# Patient Record
Sex: Female | Born: 1980
Health system: Southern US, Community
[De-identification: ages and names within clinical notes are randomized; demographics above are authoritative.]

## PROBLEM LIST (undated history)

## (undated) DIAGNOSIS — F410 Panic disorder [episodic paroxysmal anxiety] without agoraphobia: Secondary | ICD-10-CM

## (undated) DIAGNOSIS — F419 Anxiety disorder, unspecified: Secondary | ICD-10-CM

## (undated) DIAGNOSIS — N6019 Diffuse cystic mastopathy of unspecified breast: Secondary | ICD-10-CM

## (undated) DIAGNOSIS — I7300 Raynaud's syndrome without gangrene: Secondary | ICD-10-CM

## (undated) DIAGNOSIS — F32A Depression, unspecified: Secondary | ICD-10-CM

## (undated) DIAGNOSIS — J329 Chronic sinusitis, unspecified: Secondary | ICD-10-CM

## (undated) DIAGNOSIS — M069 Rheumatoid arthritis, unspecified: Secondary | ICD-10-CM

## (undated) DIAGNOSIS — G479 Sleep disorder, unspecified: Secondary | ICD-10-CM

## (undated) DIAGNOSIS — R569 Unspecified convulsions: Secondary | ICD-10-CM

## (undated) DIAGNOSIS — I10 Essential (primary) hypertension: Secondary | ICD-10-CM

## (undated) DIAGNOSIS — J3501 Chronic tonsillitis: Secondary | ICD-10-CM

## (undated) DIAGNOSIS — F259 Schizoaffective disorder, unspecified: Secondary | ICD-10-CM

## (undated) DIAGNOSIS — D649 Anemia, unspecified: Secondary | ICD-10-CM

## (undated) DIAGNOSIS — F329 Major depressive disorder, single episode, unspecified: Secondary | ICD-10-CM

## (undated) DIAGNOSIS — E785 Hyperlipidemia, unspecified: Secondary | ICD-10-CM

## (undated) DIAGNOSIS — Z8742 Personal history of other diseases of the female genital tract: Secondary | ICD-10-CM

## (undated) DIAGNOSIS — M329 Systemic lupus erythematosus, unspecified: Secondary | ICD-10-CM

## (undated) DIAGNOSIS — R51 Headache: Secondary | ICD-10-CM

## (undated) DIAGNOSIS — M792 Neuralgia and neuritis, unspecified: Secondary | ICD-10-CM

## (undated) DIAGNOSIS — E041 Nontoxic single thyroid nodule: Secondary | ICD-10-CM

## (undated) DIAGNOSIS — IMO0002 Reserved for concepts with insufficient information to code with codable children: Secondary | ICD-10-CM

## (undated) HISTORY — PX: CHOLECYSTECTOMY: SHX55

## (undated) HISTORY — PX: ABDOMINAL HYSTERECTOMY: SHX81

---

## 2011-05-24 HISTORY — PX: FOOT SURGERY: SHX648

## 2012-03-04 ENCOUNTER — Ambulatory Visit
Admission: RE | Admit: 2012-03-04 | Discharge: 2012-03-04 | Disposition: A | Discharge status: Home / Self Care | Payer: Medicaid Other | Source: Ambulatory Visit | Attending: Family Medicine | Admitting: Family Medicine

## 2012-03-04 ENCOUNTER — Other Ambulatory Visit: Payer: Self-pay | Admitting: Family Medicine

## 2012-03-04 DIAGNOSIS — I10 Essential (primary) hypertension: Secondary | ICD-10-CM

## 2012-03-23 DIAGNOSIS — J3501 Chronic tonsillitis: Secondary | ICD-10-CM

## 2012-03-23 HISTORY — DX: Chronic tonsillitis: J35.01

## 2012-04-15 ENCOUNTER — Encounter (HOSPITAL_BASED_OUTPATIENT_CLINIC_OR_DEPARTMENT_OTHER): Payer: Self-pay | Admitting: *Deleted

## 2012-04-15 NOTE — Pre-Procedure Instructions (Signed)
To come for BMET and EKG 

## 2012-04-21 ENCOUNTER — Encounter (HOSPITAL_BASED_OUTPATIENT_CLINIC_OR_DEPARTMENT_OTHER)
Admission: RE | Admit: 2012-04-21 | Discharge: 2012-04-21 | Disposition: A | Discharge status: Home / Self Care | Payer: Medicare Other | Source: Ambulatory Visit | Attending: Otolaryngology | Admitting: Otolaryngology

## 2012-04-21 LAB — BASIC METABOLIC PANEL
BUN: 8 mg/dL (ref 6–23)
CO2: 30 mEq/L (ref 19–32)
Calcium: 10 mg/dL (ref 8.4–10.5)
Chloride: 102 mEq/L (ref 96–112)
Creatinine, Ser: 0.76 mg/dL (ref 0.50–1.10)
GFR calc Af Amer: >90 mL/min (ref >90)
GFR calc non Af Amer: >90 mL/min (ref >90)
Glucose, Bld: 103 mg/dL — ABNORMAL HIGH (ref 70–99)
Potassium: 4.6 mEq/L (ref 3.5–5.1)
Sodium: 140 mEq/L (ref 135–145)

## 2012-04-22 ENCOUNTER — Ambulatory Visit (HOSPITAL_BASED_OUTPATIENT_CLINIC_OR_DEPARTMENT_OTHER)
Admission: RE | Admit: 2012-04-22 | Discharge: 2012-04-22 | Disposition: A | Discharge status: Home / Self Care | Payer: Medicare Other | Source: Ambulatory Visit | Attending: Otolaryngology | Admitting: Otolaryngology

## 2012-04-22 ENCOUNTER — Encounter (HOSPITAL_BASED_OUTPATIENT_CLINIC_OR_DEPARTMENT_OTHER): Payer: Self-pay | Admitting: Anesthesiology

## 2012-04-22 ENCOUNTER — Encounter (HOSPITAL_BASED_OUTPATIENT_CLINIC_OR_DEPARTMENT_OTHER)
Admission: RE | Disposition: A | Discharge status: Home / Self Care | Payer: Self-pay | Source: Ambulatory Visit | Attending: Otolaryngology

## 2012-04-22 ENCOUNTER — Ambulatory Visit (HOSPITAL_BASED_OUTPATIENT_CLINIC_OR_DEPARTMENT_OTHER): Payer: Medicare Other | Admitting: Anesthesiology

## 2012-04-22 ENCOUNTER — Encounter (HOSPITAL_BASED_OUTPATIENT_CLINIC_OR_DEPARTMENT_OTHER): Payer: Self-pay | Admitting: *Deleted

## 2012-04-22 DIAGNOSIS — Z01812 Encounter for preprocedural laboratory examination: Secondary | ICD-10-CM | POA: Insufficient documentation

## 2012-04-22 DIAGNOSIS — J3501 Chronic tonsillitis: Secondary | ICD-10-CM | POA: Insufficient documentation

## 2012-04-22 DIAGNOSIS — E119 Type 2 diabetes mellitus without complications: Secondary | ICD-10-CM | POA: Insufficient documentation

## 2012-04-22 DIAGNOSIS — I10 Essential (primary) hypertension: Secondary | ICD-10-CM | POA: Insufficient documentation

## 2012-04-22 DIAGNOSIS — Z01810 Encounter for preprocedural cardiovascular examination: Secondary | ICD-10-CM | POA: Insufficient documentation

## 2012-04-22 HISTORY — DX: Hyperlipidemia, unspecified: E78.5

## 2012-04-22 HISTORY — DX: Sleep disorder, unspecified: G47.9

## 2012-04-22 HISTORY — DX: Neuralgia and neuritis, unspecified: M79.2

## 2012-04-22 HISTORY — DX: Rheumatoid arthritis, unspecified: M06.9

## 2012-04-22 HISTORY — PX: TONSILLECTOMY: SHX5217

## 2012-04-22 HISTORY — DX: Essential (primary) hypertension: I10

## 2012-04-22 HISTORY — DX: Unspecified convulsions: R56.9

## 2012-04-22 HISTORY — DX: Anxiety disorder, unspecified: F41.9

## 2012-04-22 HISTORY — DX: Reserved for concepts with insufficient information to code with codable children: IMO0002

## 2012-04-22 HISTORY — DX: Nontoxic single thyroid nodule: E04.1

## 2012-04-22 HISTORY — DX: Panic disorder (episodic paroxysmal anxiety): F41.0

## 2012-04-22 HISTORY — DX: Chronic tonsillitis: J35.01

## 2012-04-22 HISTORY — DX: Schizoaffective disorder, unspecified: F25.9

## 2012-04-22 HISTORY — DX: Chronic sinusitis, unspecified: J32.9

## 2012-04-22 HISTORY — DX: Depression, unspecified: F32.A

## 2012-04-22 HISTORY — DX: Personal history of other diseases of the female genital tract: Z87.42

## 2012-04-22 HISTORY — DX: Headache: R51

## 2012-04-22 HISTORY — DX: Anemia, unspecified: D64.9

## 2012-04-22 HISTORY — DX: Diffuse cystic mastopathy of unspecified breast: N60.19

## 2012-04-22 HISTORY — DX: Major depressive disorder, single episode, unspecified: F32.9

## 2012-04-22 HISTORY — DX: Systemic lupus erythematosus, unspecified: M32.9

## 2012-04-22 HISTORY — DX: Raynaud's syndrome without gangrene: I73.00

## 2012-04-22 LAB — POCT HEMOGLOBIN-HEMACUE: Hemoglobin: 12 g/dL (ref 12.0–15.0)

## 2012-04-22 SURGERY — TONSILLECTOMY
Anesthesia: General | Site: Mouth | Wound class: Clean Contaminated

## 2012-04-22 MED ORDER — OXYCODONE HCL 5 MG PO TABS: 5.0000 mg | ORAL_TABLET | Freq: Once | ORAL | Status: DC | PRN

## 2012-04-22 MED ORDER — HYDROMORPHONE HCL PF 1 MG/ML IJ SOLN
0.2500 mg | INTRAMUSCULAR | Status: DC | PRN
  Administered 2012-04-22: 0.5 mg via INTRAVENOUS

## 2012-04-22 MED ORDER — LIDOCAINE HCL (CARDIAC) 20 MG/ML IV SOLN
INTRAVENOUS | Status: DC | PRN
  Administered 2012-04-22: 75 mg via INTRAVENOUS

## 2012-04-22 MED ORDER — LACTATED RINGERS IV SOLN
INTRAVENOUS | Status: DC
  Administered 2012-04-22: 08:00:00 via INTRAVENOUS

## 2012-04-22 MED ORDER — FENTANYL CITRATE 0.05 MG/ML IJ SOLN
INTRAMUSCULAR | Status: DC | PRN
  Administered 2012-04-22: 100 ug via INTRAVENOUS
  Administered 2012-04-22: 50 ug via INTRAVENOUS

## 2012-04-22 MED ORDER — OXYCODONE HCL 5 MG/5ML PO SOLN: 7.5000 mg | ORAL | Status: DC | PRN

## 2012-04-22 MED ORDER — ONDANSETRON HCL 4 MG/2ML IJ SOLN
INTRAMUSCULAR | Status: DC | PRN
  Administered 2012-04-22: 4 mg via INTRAVENOUS

## 2012-04-22 MED ORDER — CLINDAMYCIN PHOSPHATE 600 MG/50ML IV SOLN
INTRAVENOUS | Status: DC | PRN
  Administered 2012-04-22: 600 mg via INTRAVENOUS

## 2012-04-22 MED ORDER — OXYCODONE HCL 5 MG/5ML PO SOLN: 5.0000 mg | Freq: Once | ORAL | Status: DC | PRN

## 2012-04-22 MED ORDER — MEPERIDINE HCL 25 MG/ML IJ SOLN: 6.2500 mg | INTRAMUSCULAR | Status: DC | PRN

## 2012-04-22 MED ORDER — HYDROMORPHONE HCL PF 1 MG/ML IJ SOLN: 0.2500 mg | INTRAMUSCULAR | Status: DC | PRN

## 2012-04-22 MED ORDER — ONDANSETRON HCL 4 MG/2ML IJ SOLN: 4.0000 mg | Freq: Once | INTRAMUSCULAR | Status: DC | PRN

## 2012-04-22 MED ORDER — SUCCINYLCHOLINE CHLORIDE 20 MG/ML IJ SOLN
INTRAMUSCULAR | Status: DC | PRN
  Administered 2012-04-22: 100 mg via INTRAVENOUS

## 2012-04-22 MED ORDER — PROPOFOL 10 MG/ML IV BOLUS
INTRAVENOUS | Status: DC | PRN
  Administered 2012-04-22: 50 mg via INTRAVENOUS

## 2012-04-22 MED ORDER — DEXAMETHASONE SODIUM PHOSPHATE 4 MG/ML IJ SOLN
INTRAMUSCULAR | Status: DC | PRN
  Administered 2012-04-22: 10 mg via INTRAVENOUS

## 2012-04-22 MED ORDER — MIDAZOLAM HCL 5 MG/5ML IJ SOLN
INTRAMUSCULAR | Status: DC | PRN
  Administered 2012-04-22: 2 mg via INTRAVENOUS

## 2012-04-22 MED ORDER — CLINDAMYCIN PALMITATE HCL 75 MG/5ML PO SOLR: 150.0000 mg | Freq: Three times a day (TID) | ORAL | Status: AC

## 2012-04-22 SURGICAL SUPPLY — 32 items
BANDAGE COBAN STERILE 2 (GAUZE/BANDAGES/DRESSINGS) IMPLANT
CANISTER SUCTION 1200CC (MISCELLANEOUS) ×2 IMPLANT
CATH ROBINSON RED A/P 12FR (CATHETERS) IMPLANT
CATH ROBINSON RED A/P 14FR (CATHETERS) IMPLANT
CLOTH BEACON ORANGE TIMEOUT ST (SAFETY) ×2 IMPLANT
COAGULATOR SUCT SWTCH 10FR 6 (ELECTROSURGICAL) IMPLANT
COVER MAYO STAND STRL (DRAPES) ×2 IMPLANT
ELECT COATED BLADE 2.86 ST (ELECTRODE) ×2 IMPLANT
ELECT REM PT RETURN 9FT ADLT (ELECTROSURGICAL) ×2
ELECT REM PT RETURN 9FT PED (ELECTROSURGICAL)
ELECTRODE REM PT RETRN 9FT PED (ELECTROSURGICAL) IMPLANT
ELECTRODE REM PT RTRN 9FT ADLT (ELECTROSURGICAL) ×1 IMPLANT
GAUZE SPONGE 4X4 12PLY STRL LF (GAUZE/BANDAGES/DRESSINGS) ×2 IMPLANT
GLOVE SKINSENSE NS SZ6.5 (GLOVE) ×1
GLOVE SKINSENSE NS SZ7.5 (GLOVE) ×1
GLOVE SKINSENSE STRL SZ6.5 (GLOVE) ×1 IMPLANT
GLOVE SKINSENSE STRL SZ7.5 (GLOVE) ×1 IMPLANT
GLOVE SS BIOGEL STRL SZ 7.5 (GLOVE) IMPLANT
GLOVE SUPERSENSE BIOGEL SZ 7.5 (GLOVE)
GOWN PREVENTION PLUS XLARGE (GOWN DISPOSABLE) ×2 IMPLANT
GOWN PREVENTION PLUS XXLARGE (GOWN DISPOSABLE) IMPLANT
MARKER SKIN DUAL TIP RULER LAB (MISCELLANEOUS) IMPLANT
NS IRRIG 1000ML POUR BTL (IV SOLUTION) ×2 IMPLANT
PENCIL FOOT CONTROL (ELECTRODE) ×2 IMPLANT
SHEET MEDIUM DRAPE 40X70 STRL (DRAPES) ×2 IMPLANT
SOLUTION BUTLER CLEAR DIP (MISCELLANEOUS) IMPLANT
SPONGE TONSIL 1 RF SGL (DISPOSABLE) IMPLANT
SPONGE TONSIL 1.25 RF SGL STRG (GAUZE/BANDAGES/DRESSINGS) IMPLANT
SYR BULB 3OZ (MISCELLANEOUS) ×2 IMPLANT
TOWEL OR 17X24 6PK STRL BLUE (TOWEL DISPOSABLE) ×2 IMPLANT
TUBE CONNECTING 20X1/4 (TUBING) ×2 IMPLANT
WATER STERILE IRR 1000ML POUR (IV SOLUTION) IMPLANT

## 2012-04-22 NOTE — Brief Op Note (Signed)
04/22/2012  8:34 AM  PATIENT:  Victoria Holmes  31 y.o. female  PRE-OPERATIVE DIAGNOSIS:  chronic tonsillitis  POST-OPERATIVE DIAGNOSIS:  chronic tonsillitis  PROCEDURE:  Procedure(s) (LRB) with comments: TONSILLECTOMY (N/A)  SURGEON:  Surgeon(s) and Role:    * Drema Halon, MD - Primary  PHYSICIAN ASSISTANT:   ASSISTANTS: none   ANESTHESIA:   general  EBL:     BLOOD ADMINISTERED:none  DRAINS: none   LOCAL MEDICATIONS USED:  NONE  SPECIMEN:  Source of Specimen:  tonsils  DISPOSITION OF SPECIMEN:  PATHOLOGY  COUNTS:  YES  TOURNIQUET:  * No tourniquets in log *  DICTATION: .Other Dictation: Dictation Number [id number]  PLAN OF CARE: Discharge to home after PACU  PATIENT DISPOSITION:  PACU - hemodynamically stable.   Delay start of Pharmacological VTE agent (>24hrs) due to surgical blood loss or risk of bleeding: not applicable

## 2012-04-22 NOTE — Transfer of Care (Signed)
Immediate Anesthesia Transfer of Care Note  Patient: Victoria Holmes  Procedure(s) Performed: Procedure(s) (LRB) with comments: TONSILLECTOMY (N/A)  Patient Location: PACU  Anesthesia Type: General  Level of Consciousness: awake, alert  and oriented  Airway & Oxygen Therapy: Patient Spontanous Breathing and Patient connected to face mask oxygen  Post-op Assessment: Report given to PACU RN and Post -op Vital signs reviewed and stable  Post vital signs: Reviewed and stable  Complications: No apparent anesthesia complications

## 2012-04-22 NOTE — Anesthesia Procedure Notes (Signed)
Procedure Name: Intubation Date/Time: 04/22/2012 8:17 AM Performed by: Zenia Resides D Pre-anesthesia Checklist: Patient identified, Emergency Drugs available, Suction available, Patient being monitored and Timeout performed Patient Re-evaluated:Patient Re-evaluated prior to inductionOxygen Delivery Method: Circle System Utilized Preoxygenation: Pre-oxygenation with 100% oxygen Intubation Type: IV induction Ventilation: Mask ventilation without difficulty Laryngoscope Size: Mac and 3 Grade View: Grade I Tube type: Oral Tube size: 7.0 mm Number of attempts: 1 Airway Equipment and Method: stylet and oral airway Placement Confirmation: ETT inserted through vocal cords under direct vision,  positive ETCO2 and breath sounds checked- equal and bilateral Secured at: 23 cm Tube secured with: Tape Dental Injury: Teeth and Oropharynx as per pre-operative assessment

## 2012-04-22 NOTE — H&P (Signed)
PREOPERATIVE H&P  Chief Complaint: chronic tonsillitis  HPI: Victoria Holmes is a 31 y.o. female who presents for evaluation of chronic tonsil problems. She's had frequent infections and large cryptic tonsils on exam. She's taken to the OR for tonsillectomy.  Past Medical History  Diagnosis Date  . Seizures     last seizure > 1 yr. ago  . Headache     migraines  . Anemia     no current med.  . Rheumatoid arthritis   . Lupus   . History of endometriosis   . Hyperlipidemia   . Thyroid nodule   . Schizoaffective disorder   . Diabetes mellitus     diet-controlled  . Neuritis   . Sleep disturbance   . Depression   . Anxiety   . Panic attacks   . Raynauds disease   . Fibrocystic breast disease   . Sinusitis   . Chronic tonsillitis 03/2012  . Hypertension     under control, has been on med. x 1 yr.   Past Surgical History  Procedure Date  . Abdominal hysterectomy 6 yrs. ago    complete  . Cholecystectomy 6-8 yrs. ago  . Foot surgery 05/2011    right great toe   History   Social History  . Marital Status: Single    Spouse Name: N/A    Number of Children: N/A  . Years of Education: N/A   Social History Main Topics  . Smoking status: Current Every Day Smoker -- 3 years    Types: Cigarettes  . Smokeless tobacco: Never Used   Comment: 3 cig./day  . Alcohol Use: Yes     occasionally  . Drug Use: No  . Sexually Active:    Other Topics Concern  . None   Social History Narrative  . None   History reviewed. No pertinent family history. Allergies  Allergen Reactions  . Azithromycin Shortness Of Breath and Rash  . Darvocet (Propoxyphene-Acetaminophen) Shortness Of Breath and Rash  . Grapeseed Extract (Nutritional Supplements) Shortness Of Breath, Swelling and Rash    GRAPES  . Kiwi Extract Shortness Of Breath, Swelling and Rash  . Latex Shortness Of Breath  . Macrolides And Ketolides Shortness Of Breath and Rash  . Morphine And Related Shortness Of Breath, Rash  and Other (See Comments)    MUSCLE RIGIDITY  . Sulfa Antibiotics Shortness Of Breath    MUSCLE RIGIDITY  . Klonopin (Clonazepam) Rash and Other (See Comments)    HOMICIDAL THOUGHTS  . Penicillins Itching and Swelling  . Zoloft (Sertraline Hcl) Other (See Comments)    TARDIVE DYSKINESIA  . Adhesive (Tape) Rash  . Amoxicillin Rash  . Concerta (Methylphenidate) Nausea Only  . Lexapro (Escitalopram Oxalate) Diarrhea and Rash  . Lortab (Hydrocodone-Acetaminophen) Rash   Prior to Admission medications   Medication Sig Start Date End Date Taking? Authorizing Provider  carbamazepine (TEGRETOL) 200 MG tablet Take 200 mg by mouth 3 (three) times daily.   Yes Historical Provider, MD  fenofibrate (TRICOR) 48 MG tablet Take 48 mg by mouth daily.   Yes Historical Provider, MD  FLUoxetine (PROZAC) 20 MG tablet Take 20 mg by mouth at bedtime.   Yes Historical Provider, MD  hydrochlorothiazide (MICROZIDE) 12.5 MG capsule Take 12.5 mg by mouth daily.   Yes Historical Provider, MD  lisinopril (PRINIVIL,ZESTRIL) 10 MG tablet Take 10 mg by mouth daily.   Yes Historical Provider, MD  LORazepam (ATIVAN) 1 MG tablet Take 1 mg by mouth 3 (three) times daily.  Yes Historical Provider, MD  OLANZapine (ZYPREXA) 20 MG tablet Take 20 mg by mouth at bedtime.   Yes Historical Provider, MD  pravastatin (PRAVACHOL) 20 MG tablet Take 20 mg by mouth daily.   Yes Historical Provider, MD  EPINEPHrine (EPIPEN JR) 0.15 MG/0.3ML injection Inject 0.15 mg into the muscle as needed.    Historical Provider, MD     Positive ROS: as per HPI  All other systems have been reviewed and were otherwise negative with the exception of those mentioned in the HPI and as above.  Physical Exam: Filed Vitals:   04/22/12 0744  BP: 110/77  Pulse: 66  Temp: 97.9 F (36.6 C)  Resp: 16    General: Alert, no acute distress Oral: Normal oral mucosa and 3+ cryptic tonsils Nasal: Clear nasal passages Neck: No palpable adenopathy or  thyroid nodules Ear: Ear canal is clear with normal appearing TMs Cardiovascular: Regular rate and rhythm, no murmur.  Respiratory: Clear to auscultation Neurologic: Alert and oriented x 3   Assessment/Plan: chronic tonsillitis Plan for Procedure(s): TONSILLECTOMY   Dillard Cannon, MD 04/22/2012 8:00 AM

## 2012-04-22 NOTE — Anesthesia Preprocedure Evaluation (Signed)
Anesthesia Evaluation  Patient identified by MRN, date of birth, ID band Patient awake    Reviewed: Allergy & Precautions, H&P , NPO status , Patient's Chart, lab work & pertinent test results  Airway Mallampati: I TM Distance: >3 FB Neck ROM: Full    Dental   Pulmonary          Cardiovascular hypertension, Pt. on medications     Neuro/Psych    GI/Hepatic   Endo/Other  diabetes, Well Controlled  Renal/GU      Musculoskeletal   Abdominal   Peds  Hematology   Anesthesia Other Findings   Reproductive/Obstetrics                           Anesthesia Physical Anesthesia Plan  ASA: III  Anesthesia Plan: General   Post-op Pain Management:    Induction: Intravenous  Airway Management Planned: Oral ETT  Additional Equipment:   Intra-op Plan:   Post-operative Plan: Extubation in OR  Informed Consent: I have reviewed the patients History and Physical, chart, labs and discussed the procedure including the risks, benefits and alternatives for the proposed anesthesia with the patient or authorized representative who has indicated his/her understanding and acceptance.     Plan Discussed with: CRNA and Surgeon  Anesthesia Plan Comments:         Anesthesia Quick Evaluation

## 2012-04-23 ENCOUNTER — Encounter (HOSPITAL_BASED_OUTPATIENT_CLINIC_OR_DEPARTMENT_OTHER): Payer: Self-pay | Admitting: Otolaryngology

## 2012-04-23 NOTE — Anesthesia Postprocedure Evaluation (Signed)
Anesthesia Post Note  Patient: Victoria Holmes  Procedure(s) Performed: Procedure(s) (LRB): TONSILLECTOMY (N/A)  Anesthesia type: general  Patient location: PACU  Post pain: Pain level controlled  Post assessment: Patient's Cardiovascular Status Stable  Last Vitals:  Filed Vitals:   04/22/12 1036  BP: 125/70  Pulse: 60  Temp: 36.2 C  Resp: 16    Post vital signs: Reviewed and stable  Level of consciousness: sedated  Complications: No apparent anesthesia complications

## 2012-04-23 NOTE — Op Note (Signed)
[name], [name] NO.:  [id number]  MEDICAL RECORD NO.:  [id number]  LOCATION:                                 FACILITY:  PHYSICIAN:  Kristine Garbe. Ezzard Standing, M.D. DATE OF BIRTH:  DATE OF PROCEDURE:  04/22/2012 DATE OF DISCHARGE:                              OPERATIVE REPORT   PREOPERATIVE DIAGNOSIS:  Chronic cryptic tonsillitis.  POSTOPERATIVE DIAGNOSIS:  Chronic cryptic tonsillitis.  OPERATION:  Tonsillectomy.  SURGEON:  Kristine Garbe. Ezzard Standing, MD  ANESTHESIA:  General endotracheal.  COMPLICATIONS:  None.  BRIEF CLINICAL NOTE:  Victoria Holmes is a 31 year old female who has several medical problems, but has also had problems with her tonsils. She has had chronic cryptic tonsils with white debris within the tonsillar crypts.  She coughs ease up frequently.  She has also had intermittent sore throats and shotty adenopathy in her neck.  On exam, she has several small lymph nodes along the posterior chain lymph nodes, less than 1 cm lymphadenopathy.  She has large 2 to 3+ cryptic tonsils bilaterally.  She was taken to the operating room at this time for tonsillectomy.  DESCRIPTION OF PROCEDURE:  After adequate endotracheal anesthesia, the patient received 600 mg of Cleocin and 10 mg of Decadron.  Mouth gag was used to expose the oropharynx.  The left and right tonsils were then resected from the tonsillar fossa using cautery.  Care was taken to preserve the anterior and posterior tonsillar pillars as well as the uvula.  Hemostasis was obtained with cautery.  After removing tonsils, tonsils were sent to Pathology.  Oropharynx was irrigated with saline and procedure was completed.  [name] was awakened from anesthesia and transferred to the recovery room, postop doing well.  DISPOSITION:  [name] was discharged home on regular medications in addition to Cleocin 150 mg t.i.d. for 1 week and oxycodone for pain as she has allergy to hydrocodone.  Also,  instructed to take Tylenol or Motrin for pain.  We will have her follow up in my office in 10-14 days for recheck.          ______________________________ Kristine Garbe. Ezzard Standing, M.D.     CEN/MEDQ  D:  04/22/2012  T:  04/23/2012  Job:  [id number]  cc:   Clyda Greener, MD

## 2012-12-22 ENCOUNTER — Ambulatory Visit: Payer: Self-pay | Admitting: Obstetrics

## 2014-01-26 ENCOUNTER — Other Ambulatory Visit: Payer: Self-pay | Admitting: Family Medicine

## 2014-01-26 ENCOUNTER — Ambulatory Visit
Admission: RE | Admit: 2014-01-26 | Discharge: 2014-01-26 | Disposition: A | Discharge status: Home / Self Care | Payer: Medicare Other | Source: Ambulatory Visit | Attending: Family Medicine | Admitting: Family Medicine

## 2014-01-26 DIAGNOSIS — F17200 Nicotine dependence, unspecified, uncomplicated: Secondary | ICD-10-CM

## 2014-02-15 ENCOUNTER — Ambulatory Visit
Admission: RE | Admit: 2014-02-15 | Discharge: 2014-02-15 | Disposition: A | Discharge status: Home / Self Care | Payer: Medicare Other | Source: Ambulatory Visit | Attending: Physician Assistant | Admitting: Physician Assistant

## 2014-02-15 ENCOUNTER — Other Ambulatory Visit: Payer: Self-pay | Admitting: Physician Assistant

## 2014-02-15 DIAGNOSIS — M545 Low back pain: Secondary | ICD-10-CM

## 2014-02-15 DIAGNOSIS — M542 Cervicalgia: Secondary | ICD-10-CM

## 2014-02-17 ENCOUNTER — Other Ambulatory Visit: Payer: Self-pay | Admitting: Family Medicine

## 2014-04-30 ENCOUNTER — Encounter (HOSPITAL_COMMUNITY): Payer: Self-pay | Admitting: Emergency Medicine

## 2014-04-30 ENCOUNTER — Emergency Department (HOSPITAL_COMMUNITY)
Admission: EM | Admit: 2014-04-30 | Discharge: 2014-04-30 | Disposition: A | Discharge status: Home / Self Care | Payer: Medicare Other | Attending: Emergency Medicine | Admitting: Emergency Medicine

## 2014-04-30 DIAGNOSIS — Z8709 Personal history of other diseases of the respiratory system: Secondary | ICD-10-CM | POA: Insufficient documentation

## 2014-04-30 DIAGNOSIS — Z79899 Other long term (current) drug therapy: Secondary | ICD-10-CM | POA: Insufficient documentation

## 2014-04-30 DIAGNOSIS — B9689 Other specified bacterial agents as the cause of diseases classified elsewhere: Secondary | ICD-10-CM

## 2014-04-30 DIAGNOSIS — I10 Essential (primary) hypertension: Secondary | ICD-10-CM | POA: Diagnosis not present

## 2014-04-30 DIAGNOSIS — E119 Type 2 diabetes mellitus without complications: Secondary | ICD-10-CM | POA: Diagnosis not present

## 2014-04-30 DIAGNOSIS — Z720 Tobacco use: Secondary | ICD-10-CM | POA: Diagnosis not present

## 2014-04-30 DIAGNOSIS — R112 Nausea with vomiting, unspecified: Secondary | ICD-10-CM

## 2014-04-30 DIAGNOSIS — Z880 Allergy status to penicillin: Secondary | ICD-10-CM | POA: Diagnosis not present

## 2014-04-30 DIAGNOSIS — Z862 Personal history of diseases of the blood and blood-forming organs and certain disorders involving the immune mechanism: Secondary | ICD-10-CM | POA: Diagnosis not present

## 2014-04-30 DIAGNOSIS — K644 Residual hemorrhoidal skin tags: Secondary | ICD-10-CM | POA: Diagnosis not present

## 2014-04-30 DIAGNOSIS — F410 Panic disorder [episodic paroxysmal anxiety] without agoraphobia: Secondary | ICD-10-CM | POA: Insufficient documentation

## 2014-04-30 DIAGNOSIS — K529 Noninfective gastroenteritis and colitis, unspecified: Secondary | ICD-10-CM | POA: Diagnosis not present

## 2014-04-30 DIAGNOSIS — E785 Hyperlipidemia, unspecified: Secondary | ICD-10-CM | POA: Diagnosis not present

## 2014-04-30 DIAGNOSIS — Z91040 Latex allergy status: Secondary | ICD-10-CM | POA: Diagnosis not present

## 2014-04-30 DIAGNOSIS — N760 Acute vaginitis: Secondary | ICD-10-CM | POA: Diagnosis not present

## 2014-04-30 DIAGNOSIS — F329 Major depressive disorder, single episode, unspecified: Secondary | ICD-10-CM | POA: Insufficient documentation

## 2014-04-30 DIAGNOSIS — R1032 Left lower quadrant pain: Secondary | ICD-10-CM | POA: Diagnosis present

## 2014-04-30 LAB — URINALYSIS, ROUTINE W REFLEX MICROSCOPIC
Bilirubin Urine: NEGATIVE
Glucose, UA: NEGATIVE mg/dL
Hgb urine dipstick: NEGATIVE
Ketones, ur: NEGATIVE mg/dL
Leukocytes, UA: NEGATIVE
Nitrite: NEGATIVE
Protein, ur: NEGATIVE mg/dL
Specific Gravity, Urine: 1.017 (ref 1.005–1.030)
Urobilinogen, UA: 0.2 mg/dL (ref 0.0–1.0)
pH: 6 (ref 5.0–8.0)

## 2014-04-30 LAB — CBC WITH DIFFERENTIAL/PLATELET
Basophils Absolute: 0 10*3/uL (ref 0.0–0.1)
Basophils Relative: 0 % (ref 0–1)
Eosinophils Absolute: 0.1 10*3/uL (ref 0.0–0.7)
Eosinophils Relative: 1 % (ref 0–5)
HCT: 39.1 % (ref 36.0–46.0)
Hemoglobin: 12.9 g/dL (ref 12.0–15.0)
Lymphocytes Relative: 48 % — ABNORMAL HIGH (ref 12–46)
Lymphs Abs: 4 10*3/uL (ref 0.7–4.0)
MCH: 26.7 pg (ref 26.0–34.0)
MCHC: 33 g/dL (ref 30.0–36.0)
MCV: 80.8 fL (ref 78.0–100.0)
Monocytes Absolute: 0.4 10*3/uL (ref 0.1–1.0)
Monocytes Relative: 5 % (ref 3–12)
Neutro Abs: 3.9 10*3/uL (ref 1.7–7.7)
Neutrophils Relative %: 46 % (ref 43–77)
Platelets: 225 10*3/uL (ref 150–400)
RBC: 4.84 MIL/uL (ref 3.87–5.11)
RDW: 14.7 % (ref 11.5–15.5)
WBC: 8.4 10*3/uL (ref 4.0–10.5)

## 2014-04-30 LAB — COMPREHENSIVE METABOLIC PANEL
ALT: 37 U/L — ABNORMAL HIGH (ref 0–35)
AST: 22 U/L (ref 0–37)
Albumin: 3.1 g/dL — ABNORMAL LOW (ref 3.5–5.2)
Alkaline Phosphatase: 72 U/L (ref 39–117)
Anion gap: 9 (ref 5–15)
BUN: 7 mg/dL (ref 6–23)
CO2: 29 mEq/L (ref 19–32)
Calcium: 9 mg/dL (ref 8.4–10.5)
Chloride: 107 mEq/L (ref 96–112)
Creatinine, Ser: 0.78 mg/dL (ref 0.50–1.10)
GFR calc Af Amer: >90 mL/min (ref >90)
GFR calc non Af Amer: >90 mL/min (ref >90)
Glucose, Bld: 119 mg/dL — ABNORMAL HIGH (ref 70–99)
Potassium: 4.7 mEq/L (ref 3.7–5.3)
Sodium: 145 mEq/L (ref 137–147)
Total Bilirubin: 0.3 mg/dL (ref 0.3–1.2)
Total Protein: 6.6 g/dL (ref 6.0–8.3)

## 2014-04-30 LAB — WET PREP, GENITAL
Trich, Wet Prep: NONE SEEN
Yeast Wet Prep HPF POC: NONE SEEN

## 2014-04-30 LAB — POC OCCULT BLOOD, ED: Fecal Occult Bld: NEGATIVE

## 2014-04-30 LAB — LIPASE, BLOOD: Lipase: 63 U/L — ABNORMAL HIGH (ref 11–59)

## 2014-04-30 MED ORDER — ONDANSETRON HCL 4 MG/2ML IJ SOLN
4.0000 mg | Freq: Once | INTRAMUSCULAR | Status: AC
  Administered 2014-04-30: 4 mg via INTRAVENOUS
  Filled 2014-04-30: qty 2

## 2014-04-30 MED ORDER — METRONIDAZOLE 500 MG PO TABS: 500.0000 mg | ORAL_TABLET | Freq: Two times a day (BID) | ORAL | Status: DC

## 2014-04-30 MED ORDER — OXYCODONE-ACETAMINOPHEN 5-325 MG PO TABS
1.0000 | ORAL_TABLET | Freq: Once | ORAL | Status: AC
  Administered 2014-04-30: 1 via ORAL
  Filled 2014-04-30: qty 1

## 2014-04-30 MED ORDER — FENTANYL CITRATE 0.05 MG/ML IJ SOLN
100.0000 ug | Freq: Once | INTRAMUSCULAR | Status: AC
  Administered 2014-04-30: 100 ug via INTRAVENOUS
  Filled 2014-04-30: qty 2

## 2014-04-30 MED ORDER — SODIUM CHLORIDE 0.9 % IV BOLUS (SEPSIS)
1000.0000 mL | Freq: Once | INTRAVENOUS | Status: AC
  Administered 2014-04-30: 1000 mL via INTRAVENOUS

## 2014-04-30 MED ORDER — ONDANSETRON HCL 4 MG PO TABS: 4.0000 mg | ORAL_TABLET | Freq: Four times a day (QID) | ORAL | Status: DC

## 2014-04-30 MED ORDER — KETOROLAC TROMETHAMINE 30 MG/ML IJ SOLN
30.0000 mg | Freq: Once | INTRAMUSCULAR | Status: AC
  Administered 2014-04-30: 30 mg via INTRAVENOUS
  Filled 2014-04-30: qty 1

## 2014-04-30 MED ORDER — OXYCODONE-ACETAMINOPHEN 5-325 MG PO TABS: 1.0000 | ORAL_TABLET | Freq: Three times a day (TID) | ORAL | Status: DC | PRN

## 2014-04-30 NOTE — ED Notes (Signed)
Pt reports vomiting and abd pain for several days. Having back pain, denies any urinary symptoms. No acute distress noted at triage.

## 2014-04-30 NOTE — Discharge Instructions (Signed)

## 2014-04-30 NOTE — ED Provider Notes (Signed)
CSN: [id number]     Arrival date & time 04/30/14  4010 History   First MD Initiated Contact with Patient 04/30/14 1022     Chief Complaint  Patient presents with  . Emesis  . Abdominal Pain   Patient is a 33 y.o. female presenting with vomiting and abdominal pain.  Emesis Associated symptoms: abdominal pain and diarrhea   Associated symptoms: no chills   Abdominal Pain Associated symptoms: diarrhea, nausea, vaginal discharge and vomiting   Associated symptoms: no chest pain, no chills, no constipation, no dysuria, no fatigue, no fever, no hematuria, no shortness of breath and no vaginal bleeding      Patient is a 33 y.o. Female who presents to the ED with abdominal pain, nausea, vomiting, and loose stools x 3 days.  Patient states that she has had nausea, vomiting x 2-3 times per day, and 2 loose stools per day for 3 days.  Patient states that she has associated abdominal pain which is sharp, crampy, and non-radiating.  Patient states that her abdominal pain is 8/10.  Patient denies hematemesis, hematochezia, or melena.  Patient states that she has not had any suspicious food intake or any sick contacts.    Past Medical History  Diagnosis Date  . Seizures     last seizure > 1 yr. ago  . Headache(784.0)     migraines  . Anemia     no current med.  . Rheumatoid arthritis(714.0)   . Lupus   . History of endometriosis   . Hyperlipidemia   . Thyroid nodule   . Schizoaffective disorder   . Diabetes mellitus     diet-controlled  . Neuritis   . Sleep disturbance   . Depression   . Anxiety   . Panic attacks   . Raynauds disease   . Fibrocystic breast disease   . Sinusitis   . Chronic tonsillitis 03/2012  . Hypertension     under control, has been on med. x 1 yr.   Past Surgical History  Procedure Laterality Date  . Abdominal hysterectomy  6 yrs. ago    complete  . Cholecystectomy  6-8 yrs. ago  . Foot surgery  05/2011    right great toe  . Tonsillectomy  04/22/2012   Procedure: TONSILLECTOMY;  Surgeon: Rozetta Nunnery, MD;  Location: [facility];  Service: ENT;  Laterality: N/A;   History reviewed. No pertinent family history. History  Substance Use Topics  . Smoking status: Current Every Day Smoker -- 3 years    Types: Cigarettes  . Smokeless tobacco: Never Used     Comment: 3 cig./day  . Alcohol Use: Yes     Comment: occasionally   OB History   Grav Para Term Preterm Abortions TAB SAB Ect Mult Living                 Review of Systems  Constitutional: Negative for fever, chills and fatigue.  Respiratory: Negative for chest tightness and shortness of breath.   Cardiovascular: Negative for chest pain, palpitations and leg swelling.  Gastrointestinal: Positive for nausea, vomiting, abdominal pain and diarrhea. Negative for constipation, blood in stool and anal bleeding.  Genitourinary: Positive for vaginal discharge. Negative for dysuria, urgency, frequency, hematuria, vaginal bleeding, difficulty urinating and vaginal pain.  All other systems reviewed and are negative.     Allergies  Azithromycin; Darvocet; Grapeseed extract; Kiwi extract; Latex; Macrolides and ketolides; Morphine and related; Sulfa antibiotics; Klonopin; Penicillins; Zoloft; Adhesive; Amoxicillin; Concerta; Lexapro; and  Lortab  Home Medications   Prior to Admission medications   Medication Sig Start Date End Date Taking? Authorizing Provider  amantadine (SYMMETREL) 100 MG capsule Take 100 mg by mouth daily.   Yes Historical Provider, MD  fenofibrate (TRICOR) 48 MG tablet Take 48 mg by mouth daily.   Yes Historical Provider, MD  hydrochlorothiazide (MICROZIDE) 12.5 MG capsule Take 12.5 mg by mouth daily.   Yes Historical Provider, MD  hydroxychloroquine (PLAQUENIL) 200 MG tablet Take 400 mg by mouth daily.    Yes Historical Provider, MD  lisinopril (PRINIVIL,ZESTRIL) 10 MG tablet Take 10 mg by mouth daily.   Yes Historical Provider, MD  LORazepam (ATIVAN)  1 MG tablet Take 1 mg by mouth 3 (three) times daily.   Yes Historical Provider, MD  pravastatin (PRAVACHOL) 20 MG tablet Take 20 mg by mouth daily.   Yes Historical Provider, MD  PRESCRIPTION MEDICATION Inject 1 Syringe as directed once. Steroid shot   Yes Historical Provider, MD  traMADol (ULTRAM) 50 MG tablet Take 50 mg by mouth every 6 (six) hours as needed for moderate pain.   Yes Historical Provider, MD  EPINEPHrine (EPIPEN JR) 0.15 MG/0.3ML injection Inject 0.15 mg into the muscle as needed.    Historical Provider, MD   BP 102/69  Pulse 51  Temp(Src) 98.3 F (36.8 C) (Oral)  Resp 18  Ht 5\' 2"  (1.575 m)  Wt 177 lb (80.287 kg)  BMI 32.37 kg/m2  SpO2 100% Physical Exam  Nursing note and vitals reviewed. Constitutional: She is oriented to person, place, and time. She appears well-developed and well-nourished. No distress.  HENT:  Head: Normocephalic and atraumatic.  Mouth/Throat: Oropharynx is clear and moist. No oropharyngeal exudate.  Eyes: Conjunctivae and EOM are normal. Pupils are equal, round, and reactive to light. No scleral icterus.  Neck: Normal range of motion. Neck supple. No JVD present. No thyromegaly present.  Cardiovascular: Normal rate, regular rhythm, normal heart sounds and intact distal pulses.  Exam reveals no gallop and no friction rub.   No murmur heard. Pulmonary/Chest: Effort normal and breath sounds normal. No respiratory distress. She has no wheezes. She has no rales. She exhibits no tenderness.  Abdominal: Soft. Normal appearance and bowel sounds are normal. She exhibits no distension and no mass. There is tenderness in the suprapubic area and left lower quadrant. There is no rigidity, no rebound, no guarding, no tenderness at McBurney's point and negative Murphy's sign.  Genitourinary: Rectal exam shows external hemorrhoid. Rectal exam shows no internal hemorrhoid, no fissure, no mass, no tenderness and anal tone normal. Guaiac negative stool. No labial  fusion. There is no rash, tenderness, lesion or injury on the right labia. There is no rash, tenderness, lesion or injury on the left labia. Cervix exhibits no motion tenderness, no discharge and no friability. Right adnexum displays no mass, no tenderness and no fullness. Left adnexum displays no mass, no tenderness and no fullness. No erythema, tenderness or bleeding around the vagina. No foreign body around the vagina. No signs of injury around the vagina. No vaginal discharge found.  Uterus surgically absent   Musculoskeletal: Normal range of motion.  Lymphadenopathy:    She has no cervical adenopathy.  Neurological: She is alert and oriented to person, place, and time. She has normal strength. No cranial nerve deficit or sensory deficit. Coordination normal.  Skin: Skin is warm and dry. She is not diaphoretic.  Psychiatric: She has a normal mood and affect. Her behavior is normal. Judgment and  thought content normal.    ED Course  Procedures (including critical care time) Labs Review Labs Reviewed  WET PREP, GENITAL - Abnormal; Notable for the following:    Clue Cells Wet Prep HPF POC FEW (*)    WBC, Wet Prep HPF POC FEW (*)    All other components within normal limits  CBC WITH DIFFERENTIAL - Abnormal; Notable for the following:    Lymphocytes Relative 48 (*)    All other components within normal limits  COMPREHENSIVE METABOLIC PANEL - Abnormal; Notable for the following:    Glucose, Bld 119 (*)    Albumin 3.1 (*)    ALT 37 (*)    All other components within normal limits  LIPASE, BLOOD - Abnormal; Notable for the following:    Lipase 63 (*)    All other components within normal limits  GC/CHLAMYDIA PROBE AMP  URINALYSIS, ROUTINE W REFLEX MICROSCOPIC  POC OCCULT BLOOD, ED    Imaging Review No results found.   EKG Interpretation None      MDM   Final diagnoses:  Gastroenteritis  Non-intractable vomiting with nausea, vomiting of unspecified type  BV (bacterial  vaginosis)   Patient is a 33 y.o. Female who presents to the ED with abdominal pain, nausea and vomiting.  Physical exam reveals non-toxic appearing female with a soft tender abdomen in the suprapubic area with no evidence of guarding or rigidity.  Vitals are stable and the patient is afebrile.  CBC, CMP, Lipase and UA are unremarkable.  Hemoccult is negative.  Wet prep shows BV.  GC is pending.  Patient was treated her with zofran, toradol, fentanyl, and NS bolus.  Patient had some relief of symptoms.  I do not feel that given normal labs, no evidence of surgical abdomen, and normal vitals that imaging is indicated at this time.  Patient tolerated PO challenge here.   Patient is stable for discharge at this time.  Will treat BV with flagyl BID 500 mg x 7 days.  Patient to return to the ED for RLQ pain, intractable pain, intractable nausea and vomiting.  Patient to follow-up with her PCP.  Patient will also be sent home with percocet for pain and zofran for nausea relief.  Patient was discussed with Dr. Stevie Kern who agrees with the above plan and workup.  Patient stable for discharge.      Cherylann Parr, PA-C 04/30/14 1535

## 2014-05-01 LAB — GC/CHLAMYDIA PROBE AMP
CT Probe RNA: NEGATIVE
GC Probe RNA: NEGATIVE

## 2014-05-08 NOTE — ED Provider Notes (Signed)
Medical screening examination/treatment/procedure(s) were performed by non-physician practitioner and as supervising physician I was immediately available for consultation/collaboration.   EKG Interpretation None       Babette Relic, MD 05/08/14 2159

## 2014-05-11 ENCOUNTER — Other Ambulatory Visit: Payer: Self-pay | Admitting: Family Medicine

## 2014-05-11 DIAGNOSIS — N644 Mastodynia: Secondary | ICD-10-CM

## 2014-05-11 DIAGNOSIS — N6032 Fibrosclerosis of left breast: Secondary | ICD-10-CM

## 2014-05-19 ENCOUNTER — Ambulatory Visit
Admission: RE | Admit: 2014-05-19 | Discharge: 2014-05-19 | Disposition: A | Discharge status: Home / Self Care | Payer: Medicare Other | Source: Ambulatory Visit | Attending: Family Medicine | Admitting: Family Medicine

## 2014-05-19 DIAGNOSIS — N6032 Fibrosclerosis of left breast: Secondary | ICD-10-CM

## 2014-07-15 ENCOUNTER — Encounter (INDEPENDENT_AMBULATORY_CARE_PROVIDER_SITE_OTHER): Payer: Self-pay

## 2014-07-15 ENCOUNTER — Other Ambulatory Visit: Payer: Medicare Other

## 2014-07-15 ENCOUNTER — Ambulatory Visit (INDEPENDENT_AMBULATORY_CARE_PROVIDER_SITE_OTHER): Payer: Medicare Other | Admitting: Internal Medicine

## 2014-07-15 ENCOUNTER — Encounter: Payer: Self-pay | Admitting: Internal Medicine

## 2014-07-15 VITALS — BP 128/74 | HR 73 | Ht 63.5 in | Wt 180.0 lb

## 2014-07-15 DIAGNOSIS — F17200 Nicotine dependence, unspecified, uncomplicated: Secondary | ICD-10-CM

## 2014-07-15 DIAGNOSIS — T50995A Adverse effect of other drugs, medicaments and biological substances, initial encounter: Secondary | ICD-10-CM

## 2014-07-15 DIAGNOSIS — Z720 Tobacco use: Secondary | ICD-10-CM

## 2014-07-15 DIAGNOSIS — L270 Generalized skin eruption due to drugs and medicaments taken internally: Secondary | ICD-10-CM

## 2014-07-15 DIAGNOSIS — Z889 Allergy status to unspecified drugs, medicaments and biological substances status: Secondary | ICD-10-CM

## 2014-07-15 NOTE — Progress Notes (Signed)
07/15/14- 16 yoF light smoker  referral by Dr. Kennon Holter, allergies; pt says she has a lot of different allergies for years;  she said she can take something and do fine, but then if she takes it again, she has reaction to it. She gives history of seasonal allergic rhinitis with nasal congestion, draining and sneezing also triggered by cats and probably house dust. No problem with specific foods. Latex may cause rash. Aspirin and contrast dye are well-tolerated. Large local reaction to insect stings. Her primary concern is with suspected multiple medication intolerances: Flagyl was said to cause itching inside her eyelids. She shows a picture that looks like viral lesions in her right nostril and she does admit some history of herpes type cold sores. Many medications blamed for rash, swelling or GI upset.  Prior to Admission medications   Medication Sig Start Date End Date Taking? Authorizing Provider  EPINEPHrine (EPIPEN JR) 0.15 MG/0.3ML injection Inject 0.15 mg into the muscle as needed.   Yes Historical Provider, MD  fenofibrate (TRICOR) 48 MG tablet Take 48 mg by mouth daily.   Yes Historical Provider, MD  hydrochlorothiazide (MICROZIDE) 12.5 MG capsule Take 12.5 mg by mouth daily.   Yes Historical Provider, MD  hydroxychloroquine (PLAQUENIL) 200 MG tablet Take 400 mg by mouth daily.    Yes Historical Provider, MD  lisinopril (PRINIVIL,ZESTRIL) 10 MG tablet Take 10 mg by mouth daily.   Yes Historical Provider, MD  pravastatin (PRAVACHOL) 20 MG tablet Take 20 mg by mouth daily.   Yes Historical Provider, MD  PRESCRIPTION MEDICATION Inject 1 Syringe as directed once. Steroid shot   Yes Historical Provider, MD  traMADol (ULTRAM) 50 MG tablet Take 50 mg by mouth every 6 (six) hours as needed for moderate pain.   Yes Historical Provider, MD   Past Medical History  Diagnosis Date  . Seizures     last seizure > 1 yr. ago  . Headache(784.0)     migraines  . Anemia     no current med.  . Rheumatoid  arthritis(714.0)   . Lupus   . History of endometriosis   . Hyperlipidemia   . Thyroid nodule   . Schizoaffective disorder   . Diabetes mellitus     diet-controlled  . Neuritis   . Sleep disturbance   . Depression   . Anxiety   . Panic attacks   . Raynauds disease   . Fibrocystic breast disease   . Sinusitis   . Chronic tonsillitis 03/2012  . Hypertension     under control, has been on med. x 1 yr.   Past Surgical History  Procedure Laterality Date  . Abdominal hysterectomy  6 yrs. ago    complete  . Cholecystectomy  6-8 yrs. ago  . Foot surgery  05/2011    right great toe  . Tonsillectomy  04/22/2012    Procedure: TONSILLECTOMY;  Surgeon: Rozetta Nunnery, MD;  Location: [facility];  Service: ENT;  Laterality: N/A;   Family History  Problem Relation Age of Onset  . Diabetes Paternal Grandmother   . Hypertension Paternal Grandmother   . Kidney disease Paternal Grandmother   . Heart attack Maternal Grandmother     late 35's  . Arthritis Maternal Grandfather   . Cancer Maternal Grandfather     lung  . Lupus     History   Social History  . Marital Status: Single    Spouse Name: N/A    Number of Children: N/A  .  Years of Education: N/A   Occupational History  . Not on file.   Social History Main Topics  . Smoking status: Current Every Day Smoker -- 3 years    Types: Cigarettes  . Smokeless tobacco: Never Used     Comment: 3 cig./day  . Alcohol Use: Yes     Comment: occasionally  . Drug Use: No  . Sexual Activity: Not on file   Other Topics Concern  . Not on file   Social History Narrative   ROS-see HPI Constitutional:   No-   weight loss, night sweats, fevers, chills, fatigue, lassitude. HEENT:   +headaches, difficulty swallowing, tooth/dental problems, sore throat,       No-  Sneezing,+ itching, +ear ache, nasal congestion, post nasal drip,  CV:  + chest pain, orthopnea, PND, +swelling in lower extremities, anasarca,                                   dizziness, +palpitations Resp: + shortness of breath with exertion or at rest.              No-   productive cough,  No non-productive cough,  No- coughing up of blood.              No-   change in color of mucus.  No- wheezing.   Skin: + per HPI GI:  No-   heartburn, indigestion, +abdominal pain, nausea, vomiting, diarrhea,                 change in bowel habits, +loss of appetite GU: No-   dysuria, change in color of urine, no urgency or frequency.  No- flank pain. MS:  + joint pain or swelling.  No- decreased range of motion.  No- back pain. Neuro-     nothing unusual Psych:  No- change in mood or affect. +depression or +anxiety.  No memory loss.  OBJ- Physical Exam General- Alert, Oriented, Affect-appropriate, Distress- none acute Skin- rash-none, lesions- none, excoriation- none Lymphadenopathy- none Head- atraumatic            Eyes- Gross vision intact, PERRLA, conjunctivae and secretions clear            Ears- Hearing, canals-normal            Nose- Clear, no-Septal dev, mucus, polyps, erosion, perforation             Throat- Mallampati III , mucosa clear , drainage- none, tonsils- atrophic Neck- flexible , trachea midline, no stridor , thyroid nl, carotid no bruit Chest - symmetrical excursion , unlabored           Heart/CV- RRR , no murmur , no gallop  , no rub, nl s1 s2                           - JVD- none , edema- none, stasis changes- none, varices- none           Lung- clear to P&A, wheeze- none, cough- none , dullness-none, rub- none           Chest wall-  Abd- tender-no, distended-no, bowel sounds-present, HSM- no Br/ Gen/ Rectal- Not done, not indicated Extrem- cyanosis- none, clubbing, none, atrophy- none, strength- nl Neuro- grossly intact to observation

## 2014-07-15 NOTE — Patient Instructions (Signed)
Order- lab-     Dx drug allergy, allergic rash  Allergy profile   Stinging insect panel- 24323  Amoxacillin IgE- 87114 Ampicillin IgE 87112 Penicillin G IgE 81387 Penicillin V IgE 64680 Erythromycin IgE 87094 Sulfa IgE 32122  Avoid medicines that you are sure cause reactions. It may sometimes help to take an antihistamine like claritin, zyrtec or allegra. Sometimes what seems like a reaction may actually be something else, like herpes virus skin rash.

## 2014-07-17 ENCOUNTER — Encounter: Payer: Self-pay | Admitting: Internal Medicine

## 2014-07-17 DIAGNOSIS — F17200 Nicotine dependence, unspecified, uncomplicated: Secondary | ICD-10-CM | POA: Insufficient documentation

## 2014-07-17 DIAGNOSIS — Z889 Allergy status to unspecified drugs, medicaments and biological substances status: Secondary | ICD-10-CM | POA: Insufficient documentation

## 2014-07-17 NOTE — Assessment & Plan Note (Signed)
She reports medication intolerance to many different meds. Some of these may not hold up. Others may be related to a dye or stabilizer rather than the active ingredient. We discussed  Avoidance, management. Plan-stinging insect panel, allergy profile, IgE antibodies for amoxicillin, ampicillin, penicillin G, penicillin V, sulfa and erythromycin. These are the only assays available through our labs currently.

## 2014-07-17 NOTE — Assessment & Plan Note (Signed)
Complete cessation emphasized. If she really is only smoking 3 cigarettes per day, then she is not likely very nicotine dependent.

## 2014-07-18 LAB — ALLERGEN AMOXICILLIN
Amoxicillin IgE Class: 0
Amoxicillin IgE kU/L: <0.1 (ref <0.10)

## 2014-07-18 LAB — ALLERGEN AMPICILLIN
Ampicillin IgE Class: 0
Ampicillin IgE: <0.1 (ref <0.10)

## 2014-07-20 LAB — ALLERGY FULL PROFILE
Allergen, D pternoyssinus,d7: <0.1 kU/L
Allergen,Goose feathers, e70: <0.1 kU/L
Alternaria Alternata: <0.1 kU/L
Aspergillus fumigatus, m3: <0.1 kU/L
Bahia Grass: <0.1 kU/L
Bermuda Grass: <0.1 kU/L
Box Elder IgE: <0.1 kU/L
Candida Albicans: <0.1 kU/L
Cat Dander: <0.1 kU/L
Common Ragweed: <0.1 kU/L
Curvularia lunata: <0.1 kU/L
D. farinae: <0.1 kU/L
Dog Dander: <0.1 kU/L
Elm IgE: <0.1 kU/L
Fescue: <0.1 kU/L
G005 Rye, Perennial: <0.1 kU/L
G009 Red Top: <0.1 kU/L
Goldenrod: <0.1 kU/L
Helminthosporium halodes: <0.1 kU/L
House Dust Hollister: <0.1 kU/L
IgE (Immunoglobulin E), Serum: 6 kU/L (ref <115)
Lamb's Quarters: <0.1 kU/L
Oak: <0.1 kU/L
Plantain: <0.1 kU/L
Stemphylium Botryosum: <0.1 kU/L
Sycamore Tree: <0.1 kU/L
Timothy Grass: <0.1 kU/L

## 2014-07-20 LAB — ALLERGEN HYMENOPTERA PANEL
Honey Bee IgE: <0.1 kU/L
Paper Wasp IgE: <0.1 kU/L
White Hornet IgE: <0.1 kU/L
Yellow Hornet IgE: <0.1 kU/L
Yellow Jacket IgE: <0.1 kU/L

## 2014-07-20 LAB — ALLERGEN PENICILLIN G IGE: PENICILLIN G IGE ALLERGEN: <0.1 kU/L

## 2014-07-20 LAB — ALLERGEN PENICILLIN V (MINOR): Allergen Penicillin V (Minor): <0.1 kU/L

## 2014-07-28 LAB — SULFA IGE: Sulfamethoxazole IgE: NEGATIVE

## 2014-07-28 LAB — ERTHYROMYCIN IGE: Erythromycin IgE: NEGATIVE

## 2014-07-29 ENCOUNTER — Telehealth: Payer: Self-pay | Admitting: Internal Medicine

## 2014-07-29 NOTE — Telephone Encounter (Signed)
Notes Recorded by Doroteo Glassman, RN on 07/29/2014 at 10:58 AM [facility] x 1 Notes Recorded by Deneise Lever, MD on 07/28/2014 at 4:25 PM All blood tests for allergy antibodies were negative/ normal- for general environmental triggers like pollen, for bee and wasp stings, and for the antibiotics we could test: penicillin, ampicillin,amoxacillin, sulfa, erythromycin. This makes it more likely that the episodes she had were not because of allergy, but some other mechanism.  Pt made aware of results.

## 2014-09-15 ENCOUNTER — Ambulatory Visit (INDEPENDENT_AMBULATORY_CARE_PROVIDER_SITE_OTHER): Payer: Medicare Other | Admitting: Internal Medicine

## 2014-09-15 ENCOUNTER — Encounter: Payer: Self-pay | Admitting: Internal Medicine

## 2014-09-15 VITALS — BP 142/70 | HR 104 | Ht 63.5 in | Wt 180.4 lb

## 2014-09-15 DIAGNOSIS — Z720 Tobacco use: Secondary | ICD-10-CM

## 2014-09-15 DIAGNOSIS — Z889 Allergy status to unspecified drugs, medicaments and biological substances status: Secondary | ICD-10-CM

## 2014-09-15 DIAGNOSIS — J4520 Mild intermittent asthma, uncomplicated: Secondary | ICD-10-CM

## 2014-09-15 DIAGNOSIS — F17200 Nicotine dependence, unspecified, uncomplicated: Secondary | ICD-10-CM

## 2014-09-15 NOTE — Progress Notes (Signed)
07/15/14- 59 yoF light smoker  referral by Dr. Kennon Holter, allergies; pt says she has a lot of different allergies for years;  she said she can take something and do fine, but then if she takes it again, she has reaction to it. She gives history of seasonal allergic rhinitis with nasal congestion, draining and sneezing also triggered by cats and probably house dust. No problem with specific foods. Latex may cause rash. Aspirin and contrast dye are well-tolerated. Large local reaction to insect stings. Her primary concern is with suspected multiple medication intolerances: Flagyl was said to cause itching inside her eyelids. She shows a picture that looks like viral lesions in her right nostril and she does admit some history of herpes type cold sores. Many medications blamed for rash, swelling or GI upset.   09/15/14- 39 yoF light smoker followed for allergic rhinitis, multiple medication intolerance FOLLOWS FOR:Pt states she is no longer taking any medications for allergies. Review Labs in detail with patient. Allergy profile 07/15/14- Neg w total IgE 6 Hymenoptera- Neg Antibiotic IgE Neg for pen G&V, ampicillin, sulfa, erythromycin She mentions being stung by an insect in the past, associated with tight feeling in throat. 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 She says she takes an antibiotic, by "day 2 or 3, my breathing is off and my sats are off", then she goes to ER  It sounds as if she was hyperventilating she denies rash, cough, wheeze, sneezing or wheezing now Office Spirometry 09/15/2013-WNL. FVC 3.17/103%, FEV1 2.76/105%, FEV1/FVC 0.87    ROS-see HPI Constitutional:   No-   weight loss, night sweats, fevers, chills, fatigue, lassitude. HEENT:   +headaches, difficulty swallowing, tooth/dental problems, sore throat,       No-  Sneezing, itching, +ear ache, nasal congestion, post nasal  drip,  CV:   chest pain, orthopnea, PND, +swelling in lower extremities, anasarca,                                  dizziness, palpitations Resp: + shortness of breath with exertion or at rest.              No-   productive cough,  No non-productive cough,  No- coughing up of blood.              No-   change in color of mucus.  No- wheezing.   Skin: + per HPI GI:  No-   heartburn, indigestion, abdominal pain, nausea, vomiting,  GU:  MS:  + joint pain or swelling.   Neuro-     nothing unusual Psych:  No- change in mood or affect. +depression or +anxiety.  No memory loss.  OBJ- Physical Exam General- Alert, Oriented, Affect-appropriate, Distress- none acute Skin- rash-none, lesions- none, excoriation- none Lymphadenopathy- none Head- atraumatic            Eyes- Gross vision intact, PERRLA, conjunctivae and secretions clear            Ears- Hearing, canals-normal, TMs clear, no associated bruit.            Nose- Clear, no-Septal dev, mucus, polyps, erosion, perforation             Throat- Mallampati III , mucosa clear , drainage- none, tonsils- atrophic Neck- flexible , trachea midline, no stridor , thyroid  nl, carotid no bruit Chest - symmetrical excursion , unlabored           Heart/CV- RRR , no murmur , no gallop  , no rub, nl s1 s2                           - JVD- none , edema- none, stasis changes- none, varices- none           Lung- clear to P&A, wheeze- none, cough- none , dullness-none, rub- none           Chest wall-  Abd-  Br/ Gen/ Rectal- Not done, not indicated Extrem- cyanosis- none, clubbing, none, atrophy- none, strength- nl Neuro- grossly intact to observation

## 2014-09-15 NOTE — Patient Instructions (Signed)
Order- office spirometry   Order- [facility] PFT schedule methacholine inhalation study

## 2014-09-15 NOTE — Assessment & Plan Note (Signed)
With very low total IgE level and no elevations for any of the antibiotics, stinging insects or environmental allergens tested, I think a significant allergic reaction is unlikely. Much more probable would be episodic hyperventilation and anxiety attacks. Question if anything she describes could be asthma. Office spirometry today was normal. Plan-methacholine challenge

## 2014-09-15 NOTE — Assessment & Plan Note (Signed)
She is not trying to stop smoking now. Counseling done

## 2014-09-21 ENCOUNTER — Ambulatory Visit (HOSPITAL_COMMUNITY)
Admission: RE | Admit: 2014-09-21 | Discharge: 2014-09-21 | Disposition: A | Discharge status: Home / Self Care | Payer: Medicare Other | Source: Ambulatory Visit | Attending: Internal Medicine | Admitting: Internal Medicine

## 2014-09-21 DIAGNOSIS — J4520 Mild intermittent asthma, uncomplicated: Secondary | ICD-10-CM | POA: Diagnosis not present

## 2014-09-21 LAB — PULMONARY FUNCTION TEST
FEF 25-75 Post: 3.33 L/sec
FEF 25-75 Pre: 3.32 L/sec
FEF2575-%Change-Post: 0 %
FEF2575-%Pred-Post: 111 %
FEF2575-%Pred-Pre: 110 %
FEV1-%Change-Post: 0 %
FEV1-%Pred-Post: 105 %
FEV1-%Pred-Pre: 106 %
FEV1-Post: 2.72 L
FEV1-Pre: 2.73 L
FEV1FVC-%Change-Post: 3 %
FEV1FVC-%Pred-Pre: 100 %
FEV6-%Change-Post: -4 %
FEV6-%Pred-Post: 101 %
FEV6-%Pred-Pre: 105 %
FEV6-Post: 3.04 L
FEV6-Pre: 3.18 L
FEV6FVC-%Pred-Post: 102 %
FEV6FVC-%Pred-Pre: 102 %
FVC-%Change-Post: -4 %
FVC-%Pred-Post: 99 %
FVC-%Pred-Pre: 104 %
FVC-Post: 3.04 L
FVC-Pre: 3.18 L
Post FEV1/FVC ratio: 89 %
Post FEV6/FVC ratio: 100 %
Pre FEV1/FVC ratio: 86 %
Pre FEV6/FVC Ratio: 100 %

## 2014-09-21 MED ORDER — METHACHOLINE 1 MG/ML NEB SOLN
2.0000 mL | Freq: Once | RESPIRATORY_TRACT | Status: AC
  Administered 2014-09-21: 2 mg via RESPIRATORY_TRACT

## 2014-09-21 MED ORDER — METHACHOLINE 0.0625 MG/ML NEB SOLN
2.0000 mL | Freq: Once | RESPIRATORY_TRACT | Status: AC
  Administered 2014-09-21: 0.125 mg via RESPIRATORY_TRACT

## 2014-09-21 MED ORDER — ALBUTEROL SULFATE (2.5 MG/3ML) 0.083% IN NEBU
2.5000 mg | INHALATION_SOLUTION | Freq: Once | RESPIRATORY_TRACT | Status: AC
  Administered 2014-09-21: 2.5 mg via RESPIRATORY_TRACT

## 2014-09-21 MED ORDER — METHACHOLINE 4 MG/ML NEB SOLN
2.0000 mL | Freq: Once | RESPIRATORY_TRACT | Status: AC
  Administered 2014-09-21: 8 mg via RESPIRATORY_TRACT

## 2014-09-21 MED ORDER — METHACHOLINE 16 MG/ML NEB SOLN
2.0000 mL | Freq: Once | RESPIRATORY_TRACT | Status: AC
  Administered 2014-09-21: 32 mg via RESPIRATORY_TRACT

## 2014-09-21 MED ORDER — METHACHOLINE 0.25 MG/ML NEB SOLN
2.0000 mL | Freq: Once | RESPIRATORY_TRACT | Status: AC
  Administered 2014-09-21: 0.5 mg via RESPIRATORY_TRACT

## 2014-09-21 MED ORDER — SODIUM CHLORIDE 0.9 % IN NEBU
3.0000 mL | INHALATION_SOLUTION | Freq: Once | RESPIRATORY_TRACT | Status: AC
  Administered 2014-09-21: 3 mL via RESPIRATORY_TRACT

## 2014-11-15 ENCOUNTER — Encounter: Payer: Self-pay | Admitting: Internal Medicine

## 2014-11-15 ENCOUNTER — Ambulatory Visit (INDEPENDENT_AMBULATORY_CARE_PROVIDER_SITE_OTHER): Payer: Medicare Other | Admitting: Internal Medicine

## 2014-11-15 VITALS — BP 116/62 | HR 83 | Ht 63.5 in | Wt 182.8 lb

## 2014-11-15 DIAGNOSIS — Z889 Allergy status to unspecified drugs, medicaments and biological substances status: Secondary | ICD-10-CM | POA: Diagnosis not present

## 2014-11-15 DIAGNOSIS — Z720 Tobacco use: Secondary | ICD-10-CM | POA: Diagnosis not present

## 2014-11-15 DIAGNOSIS — F17200 Nicotine dependence, unspecified, uncomplicated: Secondary | ICD-10-CM

## 2014-11-15 NOTE — Progress Notes (Signed)
07/15/14- 68 yoF light smoker  referral by Dr. Kennon Holter, allergies; pt says she has a lot of different allergies for years;  she said she can take something and do fine, but then if she takes it again, she has reaction to it. She gives history of seasonal allergic rhinitis with nasal congestion, draining and sneezing also triggered by cats and probably house dust. No problem with specific foods. Latex may cause rash. Aspirin and contrast dye are well-tolerated. Large local reaction to insect stings. Her primary concern is with suspected multiple medication intolerances: Flagyl was said to cause itching inside her eyelids. She shows a picture that looks like viral lesions in her right nostril and she does admit some history of herpes type cold sores. Many medications blamed for rash, swelling or GI upset.   09/15/14- 64 yoF light smoker followed for allergic rhinitis, multiple medication intolerance FOLLOWS FOR:Pt states she is no longer taking any medications for allergies. Review Labs in detail with patient. Allergy profile 07/15/14- Neg w total IgE 6 Hymenoptera- Neg Antibiotic IgE Neg for pen G&V, ampicillin, sulfa, erythromycin She mentions being stung by an insect in the past, associated with tight feeling in throat. 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 She says she takes an antibiotic, by "day 2 or 3, my breathing is off and my sats are off", then she goes to ER  It sounds as if she was hyperventilating she denies rash, cough, wheeze, sneezing or wheezing now Office Spirometry 09/15/2013-WNL. FVC 3.17/103%, FEV1 2.76/105%, FEV1/FVC 0.87  11/15/14- 58 yoF light smoker followed for allergic rhinitis, multiple medication intolerance FOLLOWS FOR: Pt had Methacholine study-patient had "allergic" reaction. She describes episodes which sound more like hyperventilation than bronchospasm, but hard to  be sure. She is pretty calm in her description of events. Mecholyl 09/21/14- negative for hyperreactive airways, normal spirometry. Description suggested hyperventilation with tingling.  ROS-see HPI Constitutional:   No-   weight loss, night sweats, fevers, chills, fatigue, lassitude. HEENT:   +headaches, difficulty swallowing, tooth/dental problems, sore throat,       No-  Sneezing, itching, ear ache, nasal congestion, post nasal drip,  CV:   chest pain, orthopnea, PND, swelling in lower extremities, anasarca,                                  dizziness, palpitations Resp: + shortness of breath with exertion or at rest.              No-   productive cough,  No non-productive cough,  No- coughing up of blood.              No-   change in color of mucus.  No- wheezing.   Skin: + per HPI GI:  No-   heartburn, indigestion, abdominal pain, nausea, vomiting,  GU:  MS:  + joint pain or swelling.   Neuro-     nothing unusual Psych:  No- change in mood or affect. +depression or +anxiety.  No memory loss.  OBJ- Physical Exam General- Alert, Oriented, Affect-appropriate, Distress- none acute Skin- rash-none, lesions- none, excoriation- none Lymphadenopathy- none Head- atraumatic            Eyes- Gross vision intact, PERRLA, conjunctivae and secretions clear            Ears- Hearing,  canals-normal, TMs clear, no associated bruit.            Nose- Clear, no-Septal dev, mucus, polyps, erosion, perforation             Throat- Mallampati III , mucosa clear , drainage- none, tonsils- atrophic Neck- flexible , trachea midline, no stridor , thyroid nl, carotid no bruit Chest - symmetrical excursion , unlabored           Heart/CV- RRR , no murmur , no gallop  , no rub, nl s1 s2                           - JVD- none , edema- none, stasis changes- none, varices- none           Lung- clear to P&A, wheeze- none, cough- none , dullness-none, rub- none           Chest wall-  Abd-  Br/ Gen/ Rectal- Not done,  not indicated Extrem- cyanosis- none, clubbing, none, atrophy- none, strength- nl Neuro- grossly intact to observation

## 2014-11-15 NOTE — Patient Instructions (Signed)
Order- schedule allergy skin testing off antihistamines for 3 days ahead.

## 2014-11-20 NOTE — Assessment & Plan Note (Signed)
We will keep watching but I am not convinced that she is describing a specific sensitivity reaction Plan-schedule skin testing

## 2014-11-20 NOTE — Assessment & Plan Note (Signed)
This obviously has to stop even though she only admits to a couple of cigarettes a day

## 2014-11-24 ENCOUNTER — Ambulatory Visit: Payer: Medicare Other | Admitting: Internal Medicine

## 2014-11-24 ENCOUNTER — Encounter: Payer: Self-pay | Admitting: Internal Medicine

## 2014-11-24 VITALS — BP 96/62 | HR 86 | Temp 98.0°F | Ht 63.5 in | Wt 179.6 lb

## 2014-11-24 DIAGNOSIS — Z889 Allergy status to unspecified drugs, medicaments and biological substances status: Secondary | ICD-10-CM

## 2014-11-24 DIAGNOSIS — G40309 Generalized idiopathic epilepsy and epileptic syndromes, not intractable, without status epilepticus: Secondary | ICD-10-CM

## 2014-11-24 DIAGNOSIS — G43809 Other migraine, not intractable, without status migrainosus: Secondary | ICD-10-CM

## 2014-11-24 DIAGNOSIS — G40909 Epilepsy, unspecified, not intractable, without status epilepticus: Secondary | ICD-10-CM | POA: Insufficient documentation

## 2014-11-24 NOTE — Assessment & Plan Note (Signed)
The allergy testing done 11/23/14 only indicates some atopic potential, of uncertain relation to her symptoms attributed to drug reactions. She can take an antihistamine for "hayfever" if needed. I suggested option of allergy evaluation at [facility] for more in-depth consderation of drug allergy. She will decide.

## 2014-11-24 NOTE — Progress Notes (Signed)
07/15/14- 56 yoF light smoker  referral by Dr. Kennon Holter, allergies; pt says she has a lot of different allergies for years;  she said she can take something and do fine, but then if she takes it again, she has reaction to it. She gives history of seasonal allergic rhinitis with nasal congestion, draining and sneezing also triggered by cats and probably house dust. No problem with specific foods. Latex may cause rash. Aspirin and contrast dye are well-tolerated. Large local reaction to insect stings. Her primary concern is with suspected multiple medication intolerances: Flagyl was said to cause itching inside her eyelids. She shows a picture that looks like viral lesions in her right nostril and she does admit some history of herpes type cold sores. Many medications blamed for rash, swelling or GI upset.   09/15/14- 89 yoF light smoker followed for allergic rhinitis, multiple medication intolerance FOLLOWS FOR:Pt states she is no longer taking any medications for allergies. Review Labs in detail with patient. Allergy profile 07/15/14- Neg w total IgE 6 Hymenoptera- Neg Antibiotic IgE Neg for pen G&V, ampicillin, sulfa, erythromycin She mentions being stung by an insect in the past, associated with tight feeling in throat. 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 She says she takes an antibiotic, by "day 2 or 3, my breathing is off and my sats are off", then she goes to ER  It sounds as if she was hyperventilating she denies rash, cough, wheeze, sneezing or wheezing now Office Spirometry 09/15/2013-WNL. FVC 3.17/103%, FEV1 2.76/105%, FEV1/FVC 0.87  11/15/14- 62 yoF light smoker followed for allergic rhinitis, multiple medication intolerance FOLLOWS FOR: Pt had Methacholine study-patient had "allergic" reaction. She describes episodes which sound more like hyperventilation than bronchospasm, but hard to  be sure. She is pretty calm in her description of events. Mecholyl 09/21/14- negative for hyperreactive airways, normal spirometry. Description suggested hyperventilation with tingling. She moved through the test sequence slowly, settling down between trials and able to finish. This is not expected course if she were reacting to med.  11/23/14-33 yoF light smoker followed for allergic rhinitis, multiple medication intolerance Coming now for allergy skin testing to define environmental sensitivities better.  no anithistamines, OTC cough syrups, OTC sleep aids in past 3 days. Nausea,vomiting, chills, migraines since last night-feels weak still today. Pt states she feels like this was a small seizure. Still smoking.  When questioned, says she saw a neurologist and psychiatrist 2004 in [facility], with dx "epilepsy", on no long-term treatment. Allergy skin tests to environmental allergens 11/23/14- appropriate controls, Positive for grass and tree pollens, cat, dog, dust mite and cockroach  ROS-see HPI Constitutional:   No-   weight loss, night sweats, fevers, chills, fatigue, lassitude. HEENT:   +headaches, difficulty swallowing, tooth/dental problems, sore throat,       No-  Sneezing, itching, ear ache, nasal congestion, post nasal drip,  CV:   chest pain, orthopnea, PND, swelling in lower extremities, anasarca,                                  dizziness, palpitations Resp: + shortness of breath with exertion or at rest.              No-   productive cough,  No non-productive cough,  No- coughing up of blood.  No-   change in color of mucus.  No- wheezing.   Skin: + per HPI GI:  No-   heartburn, indigestion, abdominal pain, nausea, vomiting,  GU:  MS:  + joint pain or swelling.   Neuro-     nothing unusual Psych:  No- change in mood or affect. +depression or +anxiety.  No memory loss.  OBJ- Physical Exam General- Alert, Oriented, Affect-appropriate, Distress- none acute Skin-  rash-none, lesions- none, excoriation- none Lymphadenopathy- none Head- atraumatic            Eyes- Gross vision intact, PERRLA, conjunctivae and secretions clear            Ears- Hearing, canals-normal, TMs clear, no associated bruit.            Nose- Clear, no-Septal dev, mucus, polyps, erosion, perforation             Throat- Mallampati III , mucosa clear , drainage- none, tonsils- atrophic Neck- flexible , trachea midline, no stridor , thyroid nl, carotid no bruit Chest - symmetrical excursion , unlabored           Heart/CV- RRR , no murmur , no gallop  , no rub, nl s1 s2                           - JVD- none , edema- none, stasis changes- none, varices- none           Lung- clear to P&A, wheeze- none, cough- none , dullness-none, rub- none           Chest wall-  Abd-  Br/ Gen/ Rectal- Not done, not indicated Extrem- cyanosis- none, clubbing, none, atrophy- none, strength- nl Neuro- grossly intact to observation

## 2014-11-24 NOTE — Patient Instructions (Signed)
Ok to take otc antihistamine if needed for allergic reaction  Avoid meds that seem to cause reactions, but discuss with your doctors.  Consider calling [facility] patient line and asking for allergy evaluation for question of drug allergy   Order- referral to  [facility] for dx hx of seizure disorder and of migraine

## 2014-11-24 NOTE — Assessment & Plan Note (Signed)
We have no documentation. Saw both neurologist and psychiatrist.  Says she was treated in [facility], but stopped treatment when she came to [place]. Her episodes of ? Drug reactions? Are not well defied. Can't exclude some sort of seizure event possibly triggered by hyperventilation. She would like to see neurologist here for update of status. Plan - refer to Neurology for hx of seizure disorder

## 2014-12-29 ENCOUNTER — Encounter: Payer: Self-pay | Admitting: Internal Medicine

## 2014-12-29 ENCOUNTER — Other Ambulatory Visit: Payer: Self-pay

## 2015-02-01 ENCOUNTER — Encounter: Payer: Self-pay | Admitting: Neurology

## 2015-02-01 ENCOUNTER — Ambulatory Visit (INDEPENDENT_AMBULATORY_CARE_PROVIDER_SITE_OTHER): Payer: Medicare Other | Admitting: Neurology

## 2015-02-01 ENCOUNTER — Other Ambulatory Visit (INDEPENDENT_AMBULATORY_CARE_PROVIDER_SITE_OTHER): Payer: Medicare Other

## 2015-02-01 VITALS — BP 110/72 | HR 94 | Resp 16 | Wt 180.0 lb

## 2015-02-01 DIAGNOSIS — R290 Tetany: Secondary | ICD-10-CM | POA: Diagnosis not present

## 2015-02-01 LAB — COMPREHENSIVE METABOLIC PANEL
ALT: 28 U/L (ref 0–35)
AST: 26 U/L (ref 0–37)
Albumin: 3.6 g/dL (ref 3.5–5.2)
Alkaline Phosphatase: 63 U/L (ref 39–117)
BUN: 8 mg/dL (ref 6–23)
CO2: 29 mEq/L (ref 19–32)
Calcium: 9.2 mg/dL (ref 8.4–10.5)
Chloride: 108 mEq/L (ref 96–112)
Creatinine, Ser: 0.77 mg/dL (ref 0.40–1.20)
GFR: 110.19 mL/min (ref >60.00)
Glucose, Bld: 87 mg/dL (ref 70–99)
Potassium: 4.1 mEq/L (ref 3.5–5.1)
Sodium: 142 mEq/L (ref 135–145)
Total Bilirubin: 0.3 mg/dL (ref 0.2–1.2)
Total Protein: 6.4 g/dL (ref 6.0–8.3)

## 2015-02-01 LAB — MAGNESIUM: Magnesium: 2.1 mg/dL (ref 1.5–2.5)

## 2015-02-01 LAB — CK: Total CK: 72 U/L (ref 7–177)

## 2015-02-01 NOTE — Patient Instructions (Signed)
1. Routine EEG 2. Bloodwork for CMP, ionized calcium, magnesium, CK 3. Follow-up if tests come back abnormal

## 2015-02-01 NOTE — Progress Notes (Signed)
NEUROLOGY CONSULTATION NOTE  [name] MRN: [id number] DOB: 1980/11/01  Referring provider: Dr. Baird Lyons Primary care provider: Dr. Lindwood Qua  Reason for consult:  Report of seizures  Dear Dr Annamaria Boots:  Thank you for your kind referral of Victoria Holmes for consultation of the above symptoms. Although her history is well known to you, please allow me to reiterate it for the purpose of our medical record. Records and images were personally reviewed where available.  HISTORY OF PRESENT ILLNESS: This is a 34 year old right-handed woman with a diagnosis of lupus, rheumatoid arthritis, Reynaud's disease, multiple drug allergies, schizoaffective disorder, presenting for evaluation of recurrent episodes of hand spasms that she has been told are seizures. She reports symptoms started 13 years ago, she was working in a [employer] back when she started having tingling all over her body. She went out to get fresh air, symptoms subsided, she came back in and started to pick up the tray but her hands were locked up. She demonstrates this with both arms flexed at the elbow and hands flexed at wrists with fingers extended suggestive of carpopedal spasms, although her legs are unaffected. She went to the nurses station and was noted she had turned blue with O2 sats between 60-65%. She was told by the nurse that maybe she had a seizure because she was shaking. She was able to get some words out. She was given Ativan and Tramadol for the pain in her hands and arms. Since then, she has had these recurrent symptoms that mostly occur when she takes medications. She has a long list of medication allergies, and reports that she would usually tolerate the first dose without any symptoms, but after she takes the second dose, she would have the hand spasms, she feels like her airway is cut off. The tingling would start in her face then subside, followed by tingling in both feet, then up her  body to her arms, hands, and face, worst in the middle of her face. Her arms then become stiff.  She could be stressed or overexerts herself with some of these. She is concerned because she feels that bloodwork has never been done with any of these events. After the events, she feels like it takes her a while to come back mentally, "taking longer for my mind to function after."   It appears she has been told that symptoms are due to hyperventilation, and she repeatedly states today that with some of them, she is calm and not hyperventilating or feeling that her airway is tight. One time she was given morphine and had a reaction where she was visibly hyperventilating but did not have any hand spasms. Another time she had an episode while getting a nebulizer treatment and states she was not hyperventilating at that time. She has prn Ativan in her purse, and would ask someone to get for her, because she cannot move her hands, with resolution of symptoms 5-8 minutes after taking the Ativan. She recalls being prescribed gabapentin in the past, with no effect on her symptoms.   She feels that there is no one in her family who has the same symptoms to understand what she is going through. She goes outside and feels like her skin is on fire. She had blackout spells around the time of her period when younger, none recently. She reports that her mind races everyday "all the time," her depression for her son consumes her all day. She  denies any tongue bite or incontinence, no episodes of staring/unresponsiveness, gaps in time, olfactory/gustatory hallucinations, deja vu, rising epigastric sensation, myoclonic jerks. Her aunt has similar symptoms, one of them while having a nervous breakdown. She reports a strong family history of seizures on both sides, a paternal aunt has seizures, her brother's children have seizures.  She reports being evaluated in the past with a normal MRI brain. There is no history of head trauma or  CNS infections.   She has a history of migraines that occur in clusters. She used to get Toradol, now she mainly uses Aleve. She has occasional blurred vision on her right eye. She has occasional vertigo. She has constant back pain. She reports being diagnosed with lupus and RA in [place], most recently seen by Rheumatologist Dr. Trudie Reed with a diagnoses of polyarthritis and "something to do with my immune system." She has stopped Plaquenil since October again due to drug hypersensitivity.    PAST MEDICAL HISTORY: Past Medical History  Diagnosis Date  . Seizures     last seizure > 1 yr. ago  . Headache(784.0)     migraines  . Anemia     no current med.  . Rheumatoid arthritis(714.0)   . Lupus   . History of endometriosis   . Hyperlipidemia   . Thyroid nodule   . Schizoaffective disorder   . Diabetes mellitus     diet-controlled  . Neuritis   . Sleep disturbance   . Depression   . Anxiety   . Panic attacks   . Raynauds disease   . Fibrocystic breast disease   . Sinusitis   . Chronic tonsillitis 03/2012  . Hypertension     under control, has been on med. x 1 yr.    PAST SURGICAL HISTORY: Past Surgical History  Procedure Laterality Date  . Abdominal hysterectomy  6 yrs. ago    complete  . Cholecystectomy  6-8 yrs. ago  . Foot surgery  05/2011    right great toe  . Tonsillectomy  04/22/2012    Procedure: TONSILLECTOMY;  Surgeon: Rozetta Nunnery, MD;  Location: [facility];  Service: ENT;  Laterality: N/A;    MEDICATIONS: Current Outpatient Prescriptions on File Prior to Visit  Medication Sig Dispense Refill  . EPINEPHrine (EPIPEN JR) 0.15 MG/0.3ML injection Inject 0.15 mg into the muscle as needed.    . fenofibrate (TRICOR) 48 MG tablet Take 48 mg by mouth daily.    . hydrochlorothiazide (MICROZIDE) 12.5 MG capsule Take 12.5 mg by mouth daily.    . hydroxychloroquine (PLAQUENIL) 200 MG tablet Take 400 mg by mouth daily.     Marland Kitchen lisinopril  (PRINIVIL,ZESTRIL) 10 MG tablet Take 10 mg by mouth daily.    . pravastatin (PRAVACHOL) 20 MG tablet Take 20 mg by mouth daily.    . traMADol (ULTRAM) 50 MG tablet Take 50 mg by mouth every 6 (six) hours as needed for moderate pain.     No current facility-administered medications on file prior to visit.    ALLERGIES: Allergies  Allergen Reactions  . Azithromycin Shortness Of Breath and Rash  . Darvocet [Propoxyphene N-Acetaminophen] Shortness Of Breath and Rash  . Grapeseed Extract [Nutritional Supplements] Shortness Of Breath, Swelling and Rash    GRAPES  . Kiwi Extract Shortness Of Breath, Swelling and Rash  . Latex Shortness Of Breath  . Macrolides And Ketolides Shortness Of Breath and Rash  . Morphine And Related Shortness Of Breath, Rash and Other (See Comments)  MUSCLE RIGIDITY  . Sulfa Antibiotics Shortness Of Breath    MUSCLE RIGIDITY  . Klonopin [Clonazepam] Rash and Other (See Comments)    HOMICIDAL THOUGHTS  . Penicillins Itching and Swelling  . Zoloft [Sertraline Hcl] Other (See Comments)    TARDIVE DYSKINESIA  . Contrast Media [Iodinated Diagnostic Agents] Swelling    Pt CAN NOT have drinking contrast; She CAN have IV contrast  . Flagyl [Metronidazole] Nausea And Vomiting  . Adhesive [Tape] Rash  . Amoxicillin Rash  . Concerta [Methylphenidate] Nausea Only  . Lexapro [Escitalopram Oxalate] Diarrhea and Rash  . Lortab [Hydrocodone-Acetaminophen] Rash    FAMILY HISTORY: Family History  Problem Relation Age of Onset  . Diabetes Paternal Grandmother   . Hypertension Paternal Grandmother   . Kidney disease Paternal Grandmother   . Heart attack Maternal Grandmother     late 29's  . Arthritis Maternal Grandfather   . Cancer Maternal Grandfather     lung  . Lupus      SOCIAL HISTORY: History   Social History  . Marital Status: Single    Spouse Name: N/A  . Number of Children: N/A  . Years of Education: N/A   Occupational History  . [employer]     Social History Main Topics  . Smoking status: Current Every Day Smoker -- 3 years    Types: Cigarettes  . Smokeless tobacco: Never Used     Comment: 3 cig./day  . Alcohol Use: 0.0 oz/week    0 Standard drinks or equivalent per week     Comment: occasionally  . Drug Use: No  . Sexual Activity: Not on file   Other Topics Concern  . Not on file   Social History Narrative    REVIEW OF SYSTEMS: Constitutional: No fevers, chills, or sweats, no generalized fatigue, change in appetite Eyes: No visual changes, double vision, eye pain Ear, nose and throat: No hearing loss, ear pain, nasal congestion, sore throat Cardiovascular: No chest pain, palpitations Respiratory:  No shortness of breath at rest or with exertion, wheezes GastrointestinaI: No nausea, vomiting, diarrhea, abdominal pain, fecal incontinence Genitourinary:  No dysuria, urinary retention or frequency Musculoskeletal:  No neck pain,+ back pain Integumentary: No rash, pruritus, skin lesions Neurological: as above Psychiatric: + depression, insomnia, anxiety Endocrine: No palpitations, fatigue, diaphoresis, mood swings, change in appetite, change in weight, increased thirst Hematologic/Lymphatic:  No anemia, purpura, petechiae. Allergic/Immunologic: no itchy/runny eyes, nasal congestion, recent allergic reactions, rashes  PHYSICAL EXAM: Filed Vitals:   02/01/15 1007  BP: 110/72  Pulse: 94  Resp: 16   General: No acute distress Head:  Normocephalic/atraumatic Eyes: Fundoscopic exam shows bilateral sharp discs, no vessel changes, exudates, or hemorrhages Neck: supple, no paraspinal tenderness, full range of motion Back: No paraspinal tenderness Heart: regular rate and rhythm Lungs: Clear to auscultation bilaterally. Vascular: No carotid bruits. Skin/Extremities: No rash, no edema Neurological Exam: Mental status: alert and oriented to person, place, and time, no dysarthria or aphasia, Fund of knowledge is  appropriate.  Recent and remote memory are intact.  Attention and concentration are normal.    Able to name objects and repeat phrases. Cranial nerves: CN I: not tested CN II: pupils equal, round and reactive to light, visual fields intact, fundi unremarkable. CN III, IV, VI:  full range of motion, no nystagmus, no ptosis CN V: facial sensation intact CN VII: upper and lower face symmetric CN VIII: hearing intact to finger rub CN IX, X: gag intact, uvula midline CN XI: sternocleidomastoid and  trapezius muscles intact CN XII: tongue midline Bulk & Tone: normal, no fasciculations. Motor: 5/5 throughout with no pronator drift. Sensation: reports decreased cold on right UE, otherwise intact to light touch, pin, vibration and joint position sense.  No extinction to double simultaneous stimulation.  Romberg test negative Deep Tendon Reflexes: +1 throughout except for absent ankle jerks bilaterally, no ankle clonus Plantar responses: downgoing bilaterally Cerebellar: no incoordination on finger to nose, heel to shin. No dysdiadochokinesia Gait: narrow-based and steady, able to tandem walk adequately. Tremor: none  IMPRESSION: This is a 34 year old right-handed woman with a diagnosis of lupus, rheumatoid arthritis, Reynaud's disease, multiple drug allergies, schizoaffective disorder, presenting for evaluation of recurrent episodes described as body tingling, sensation of tight airway, then hand spasms. The constellation of symptoms are suggestive of carpopedal spasms (although no leg symptoms), we discussed this is likely due to hyperventilation or panic attacks, particularly since Ativan helps calm down the symptoms. Epileptic seizures are highly unlikely, a routine EEG will be ordered to assess for focal abnormalities that increase risk for recurrent seizures. Check CMP, ionized calcium, magnesium, CK. No indication to start anti-seizure medication at this time. She will follow-up on an as needed  basis unless EEG is abnormal.   Thank you for allowing me to participate in the care of this patient. Please do not hesitate to call for any questions or concerns.   Ellouise Newer, M.D.  CC: Dr. Annamaria Boots, Dr. Kennon Holter

## 2015-02-02 ENCOUNTER — Ambulatory Visit (INDEPENDENT_AMBULATORY_CARE_PROVIDER_SITE_OTHER): Payer: Medicare Other | Admitting: Neurology

## 2015-02-02 DIAGNOSIS — R290 Tetany: Secondary | ICD-10-CM

## 2015-02-02 LAB — CALCIUM, IONIZED: Calcium, Ion: 1.33 mmol/L — ABNORMAL HIGH (ref 1.12–1.32)

## 2015-02-02 NOTE — Procedures (Signed)
ELECTROENCEPHALOGRAM REPORT  Date of Study: 02/02/2015  Patient's Name: Victoria Holmes MRN: [id number] Date of Birth: 1981-06-30  Referring Provider: Dr. Ellouise Newer  Clinical History: This is a 34 year old woman with recurrent episodes of body tingling followed by spasms in both hands. She reports feeling slightly confused after.  Medications: No anti-epileptic medications  Technical Summary: A multichannel digital EEG recording measured by the international 10-20 system with electrodes applied with paste and impedances below 5000 ohms performed in our laboratory with EKG monitoring in an awake and asleep patient.  Hyperventilation and photic stimulation were performed.  The digital EEG was referentially recorded, reformatted, and digitally filtered in a variety of bipolar and referential montages for optimal display.    Description: The patient is awake and asleep during the recording.  During maximal wakefulness, there is a symmetric, medium voltage 10 Hz posterior dominant rhythm that attenuates with eye opening.  The record is symmetric.  During drowsiness and sleep, there is an increase in theta slowing of the background.  Vertex waves and symmetric sleep spindles were seen.  Hyperventilation and photic stimulation did not elicit any abnormalities.  There were no epileptiform discharges or electrographic seizures seen.    EKG lead was unremarkable.  Impression: This awake and asleep EEG is normal.    Clinical Correlation: A normal EEG does not exclude a clinical diagnosis of epilepsy. Clinical correlation is advised.   Ellouise Newer, M.D.

## 2015-02-04 ENCOUNTER — Telehealth: Payer: Self-pay | Admitting: Family Medicine

## 2015-02-04 NOTE — Telephone Encounter (Signed)
Patient was notified of results.  

## 2015-02-04 NOTE — Telephone Encounter (Signed)
-----   Message from Cameron Sprang, MD sent at 02/02/2015  3:07 PM EDT ----- Pls let her know her bloodwork is normal, electrolytes normal. Her EEG is normal, and does not show evidence of epilepsy. Thanks

## 2015-11-29 ENCOUNTER — Other Ambulatory Visit (INDEPENDENT_AMBULATORY_CARE_PROVIDER_SITE_OTHER): Payer: Self-pay | Admitting: Otolaryngology

## 2015-11-29 DIAGNOSIS — H93A2 Pulsatile tinnitus, left ear: Secondary | ICD-10-CM

## 2015-12-07 ENCOUNTER — Ambulatory Visit
Admission: RE | Admit: 2015-12-07 | Discharge: 2015-12-07 | Disposition: A | Discharge status: Home / Self Care | Payer: Medicare Other | Source: Ambulatory Visit | Attending: Otolaryngology | Admitting: Otolaryngology

## 2015-12-07 ENCOUNTER — Other Ambulatory Visit: Payer: Medicare Other

## 2015-12-07 DIAGNOSIS — H93A2 Pulsatile tinnitus, left ear: Secondary | ICD-10-CM

## 2015-12-07 MED ORDER — GADOBENATE DIMEGLUMINE 529 MG/ML IV SOLN
20.0000 mL | Freq: Once | INTRAVENOUS | Status: AC | PRN
  Administered 2015-12-07: 17 mL via INTRAVENOUS

## 2015-12-12 ENCOUNTER — Inpatient Hospital Stay: Admission: RE | Admit: 2015-12-12 | Payer: Medicare Other | Source: Ambulatory Visit

## 2015-12-18 ENCOUNTER — Ambulatory Visit
Admission: RE | Admit: 2015-12-18 | Discharge: 2015-12-18 | Disposition: A | Discharge status: Home / Self Care | Payer: Medicare Other | Source: Ambulatory Visit | Attending: Otolaryngology | Admitting: Otolaryngology

## 2015-12-18 DIAGNOSIS — H93A2 Pulsatile tinnitus, left ear: Secondary | ICD-10-CM

## 2015-12-18 MED ORDER — GADOBENATE DIMEGLUMINE 529 MG/ML IV SOLN
17.0000 mL | Freq: Once | INTRAVENOUS | Status: AC | PRN
  Administered 2015-12-18: 17 mL via INTRAVENOUS

## 2015-12-21 IMAGING — CR DG CHEST 2V
2 series · 2 of 2 positions shown · non-contrast
Comparison: March 04, 2012

CLINICAL DATA: Tobacco use

EXAM:
CHEST  2 VIEW

[w chest pa]
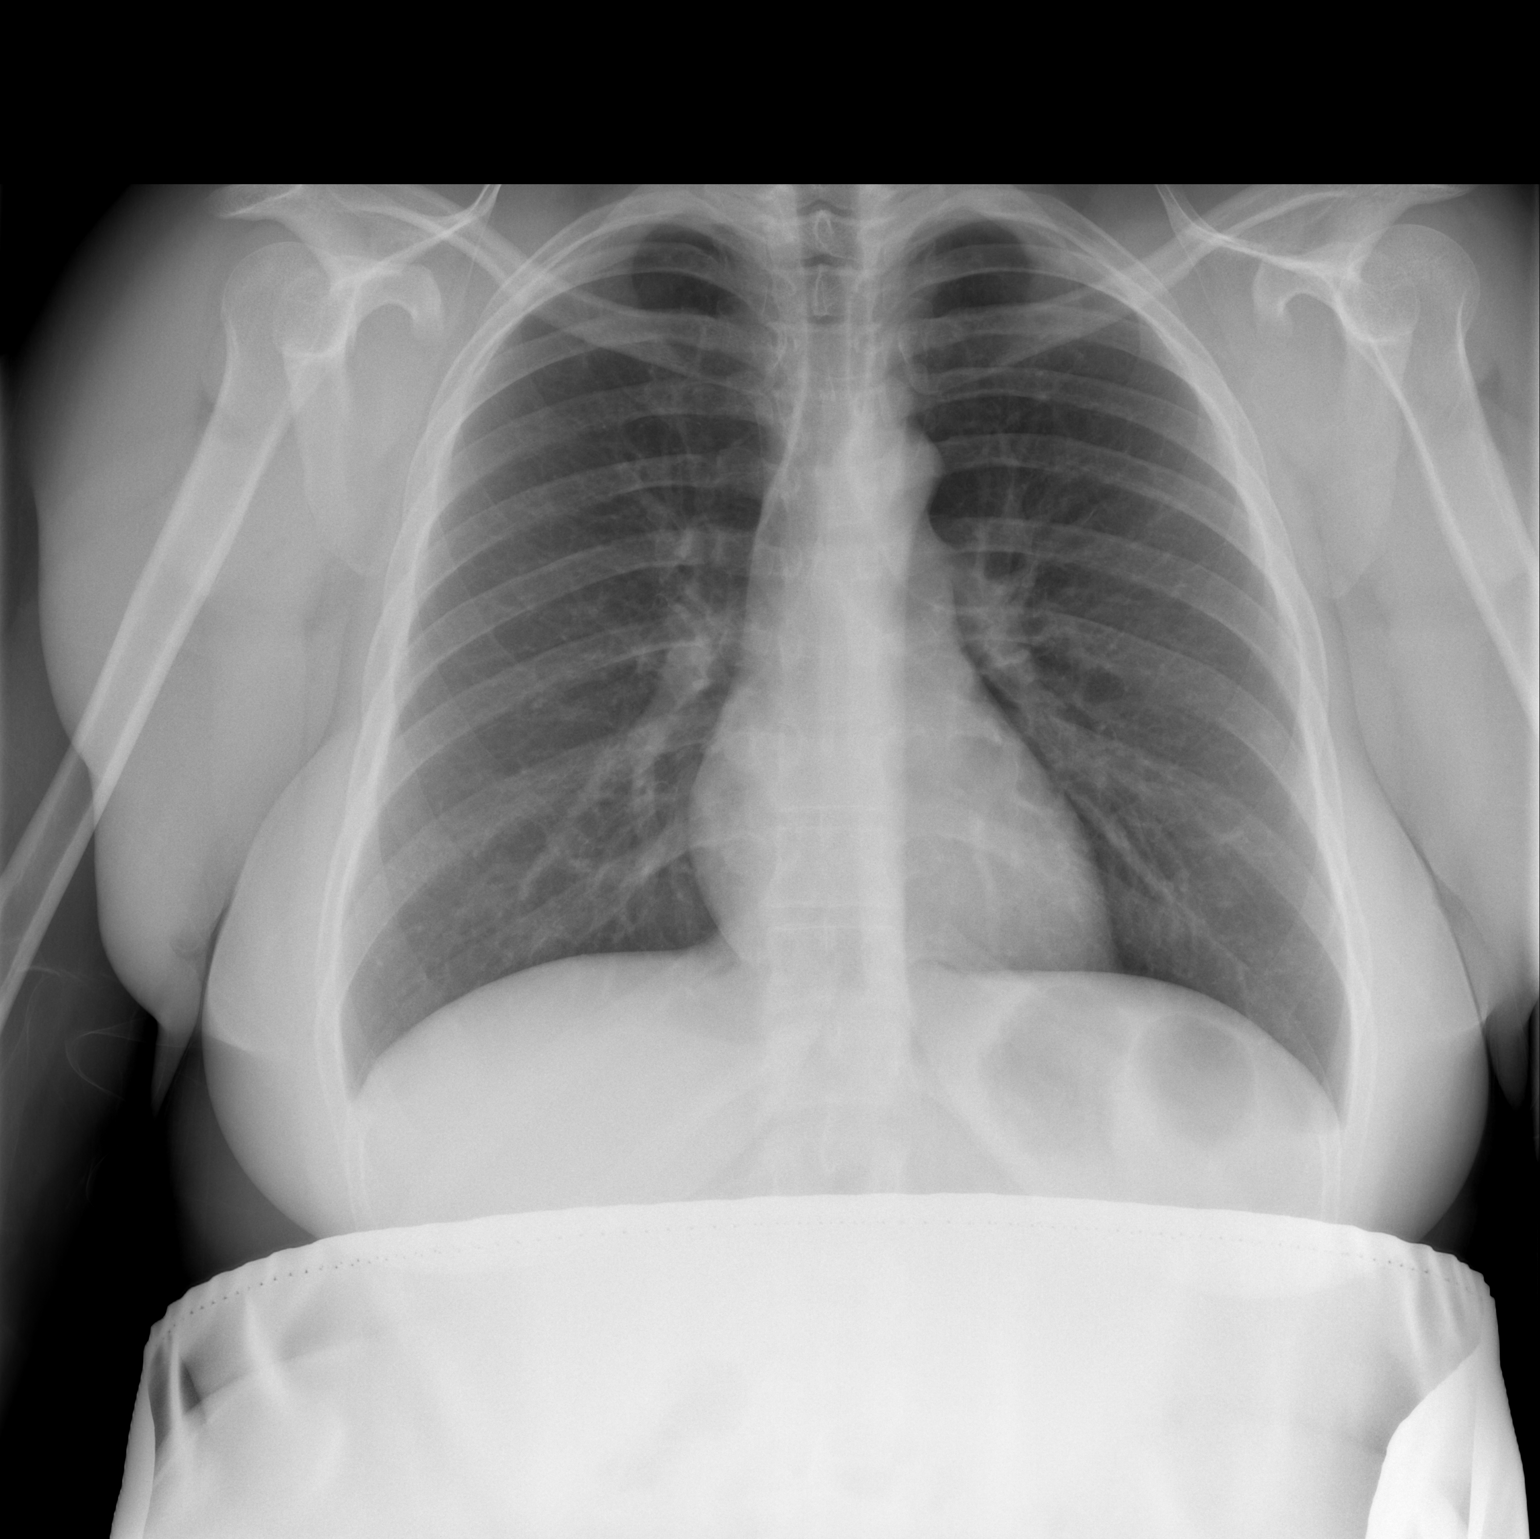

[w chest lat]
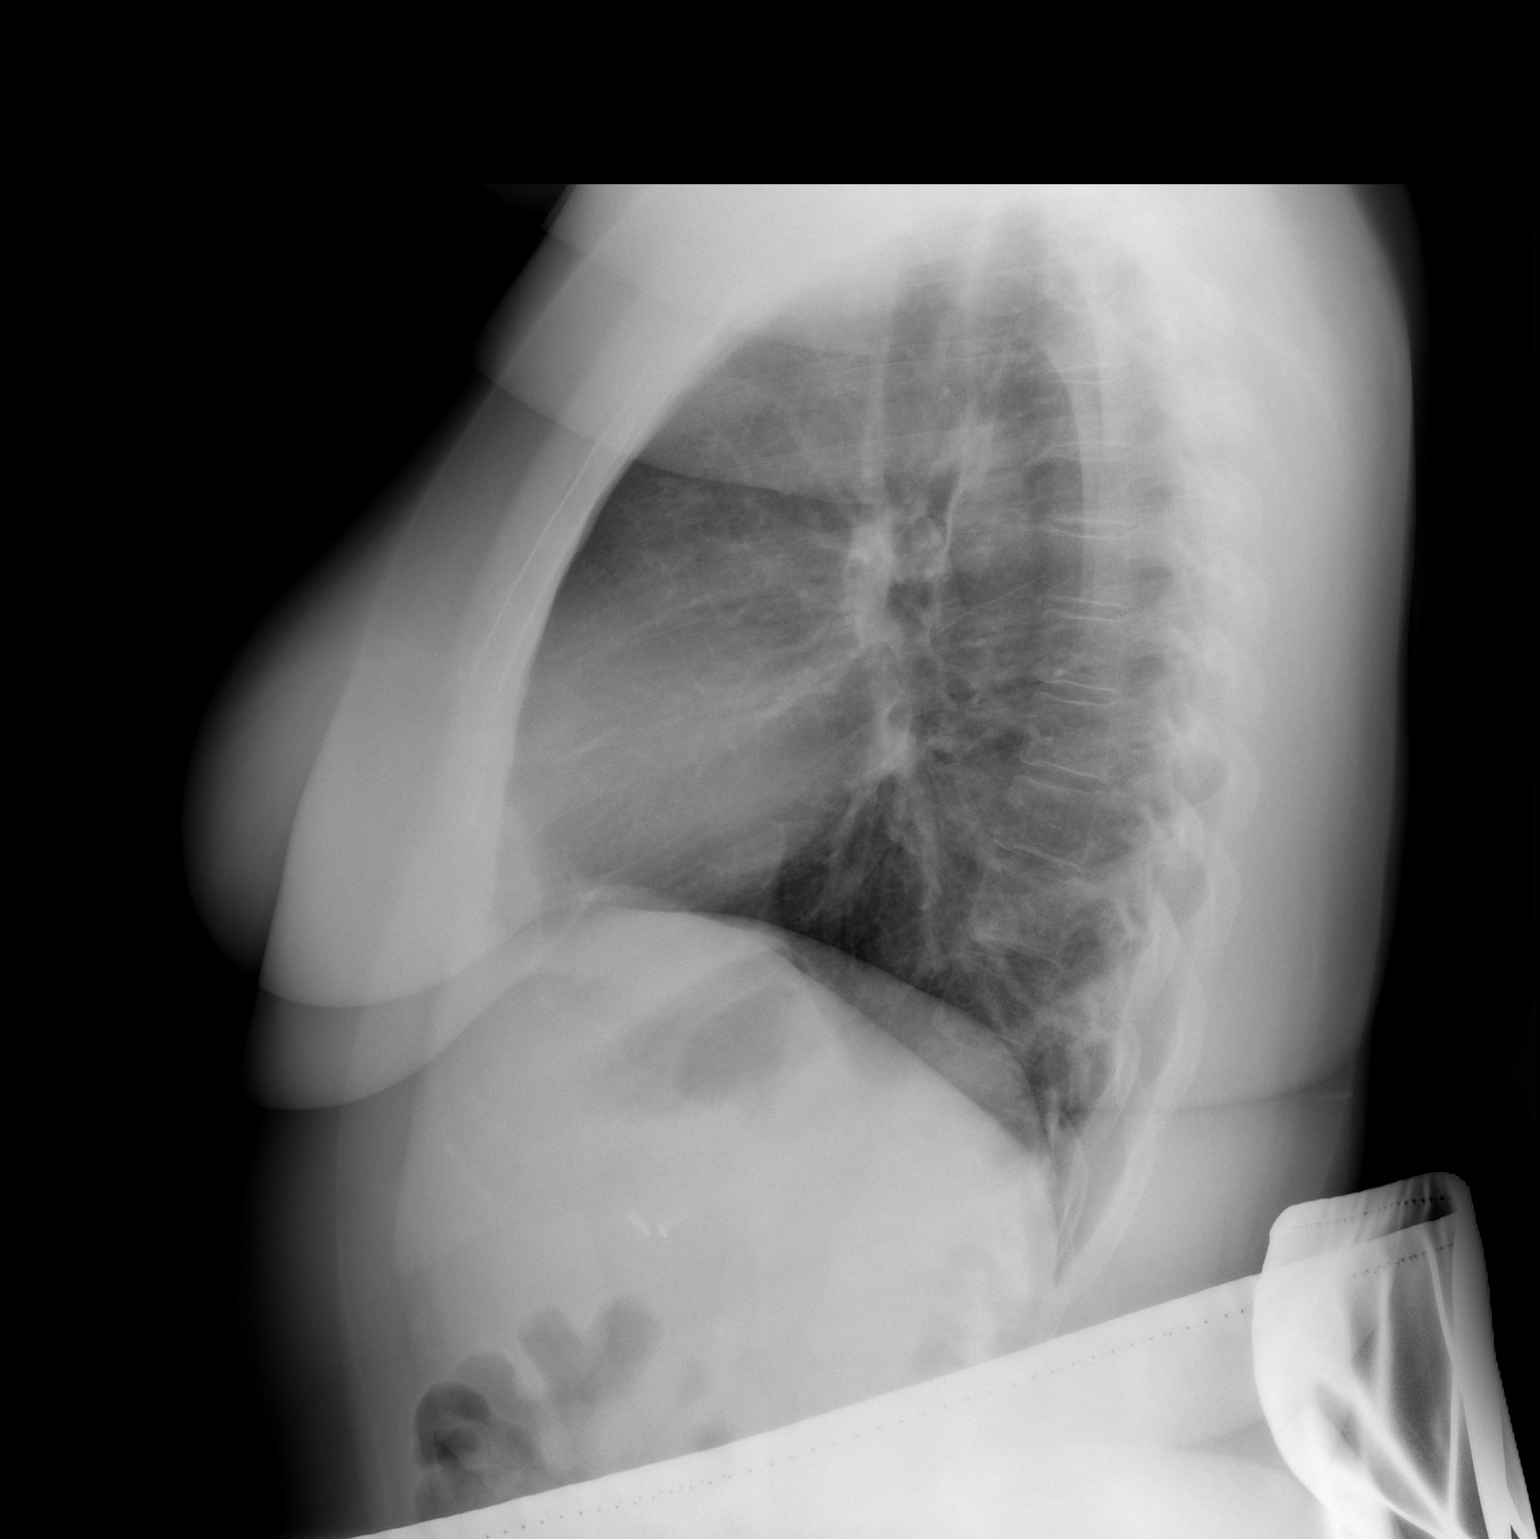

[2 of 2 positions shown; findings below may reference images not displayed]

FINDINGS: The heart size and mediastinal contours are within normal limits.
There is no focal infiltrate, pulmonary edema, or pleural effusion.
The visualized skeletal structures are stable. Prior cholecystectomy
clips are noted.
IMPRESSION: No active cardiopulmonary disease.

## 2016-08-20 DIAGNOSIS — Z888 Allergy status to other drugs, medicaments and biological substances status: Secondary | ICD-10-CM | POA: Diagnosis not present

## 2016-08-20 DIAGNOSIS — I10 Essential (primary) hypertension: Secondary | ICD-10-CM | POA: Diagnosis not present

## 2016-08-20 DIAGNOSIS — Z885 Allergy status to narcotic agent status: Secondary | ICD-10-CM | POA: Diagnosis not present

## 2016-08-20 DIAGNOSIS — E119 Type 2 diabetes mellitus without complications: Secondary | ICD-10-CM | POA: Diagnosis not present

## 2016-08-20 DIAGNOSIS — H93A2 Pulsatile tinnitus, left ear: Secondary | ICD-10-CM | POA: Diagnosis not present

## 2016-08-20 DIAGNOSIS — Z886 Allergy status to analgesic agent status: Secondary | ICD-10-CM | POA: Diagnosis not present

## 2016-08-20 DIAGNOSIS — F17210 Nicotine dependence, cigarettes, uncomplicated: Secondary | ICD-10-CM | POA: Diagnosis not present

## 2016-08-20 DIAGNOSIS — Z882 Allergy status to sulfonamides status: Secondary | ICD-10-CM | POA: Diagnosis not present

## 2016-08-20 DIAGNOSIS — Z880 Allergy status to penicillin: Secondary | ICD-10-CM | POA: Diagnosis not present

## 2016-10-16 DIAGNOSIS — E785 Hyperlipidemia, unspecified: Secondary | ICD-10-CM | POA: Diagnosis not present

## 2016-10-16 DIAGNOSIS — F419 Anxiety disorder, unspecified: Secondary | ICD-10-CM | POA: Diagnosis not present

## 2016-10-16 DIAGNOSIS — E1140 Type 2 diabetes mellitus with diabetic neuropathy, unspecified: Secondary | ICD-10-CM | POA: Diagnosis not present

## 2016-10-16 DIAGNOSIS — Z113 Encounter for screening for infections with a predominantly sexual mode of transmission: Secondary | ICD-10-CM | POA: Diagnosis not present

## 2016-10-16 DIAGNOSIS — Z0110 Encounter for examination of ears and hearing without abnormal findings: Secondary | ICD-10-CM | POA: Diagnosis not present

## 2016-10-16 DIAGNOSIS — I10 Essential (primary) hypertension: Secondary | ICD-10-CM | POA: Diagnosis not present

## 2016-10-16 DIAGNOSIS — F4310 Post-traumatic stress disorder, unspecified: Secondary | ICD-10-CM | POA: Diagnosis not present

## 2016-10-16 DIAGNOSIS — H9209 Otalgia, unspecified ear: Secondary | ICD-10-CM | POA: Diagnosis not present

## 2016-10-16 DIAGNOSIS — M069 Rheumatoid arthritis, unspecified: Secondary | ICD-10-CM | POA: Diagnosis not present

## 2016-10-16 DIAGNOSIS — Z720 Tobacco use: Secondary | ICD-10-CM | POA: Diagnosis not present

## 2016-10-16 DIAGNOSIS — M329 Systemic lupus erythematosus, unspecified: Secondary | ICD-10-CM | POA: Diagnosis not present

## 2016-10-16 DIAGNOSIS — F329 Major depressive disorder, single episode, unspecified: Secondary | ICD-10-CM | POA: Diagnosis not present

## 2016-10-16 DIAGNOSIS — E119 Type 2 diabetes mellitus without complications: Secondary | ICD-10-CM | POA: Diagnosis not present

## 2016-10-16 DIAGNOSIS — H539 Unspecified visual disturbance: Secondary | ICD-10-CM | POA: Diagnosis not present

## 2016-10-17 DIAGNOSIS — F430 Acute stress reaction: Secondary | ICD-10-CM | POA: Diagnosis not present

## 2016-11-12 DIAGNOSIS — F430 Acute stress reaction: Secondary | ICD-10-CM | POA: Diagnosis not present

## 2016-12-03 DIAGNOSIS — Z0000 Encounter for general adult medical examination without abnormal findings: Secondary | ICD-10-CM | POA: Diagnosis not present

## 2016-12-03 DIAGNOSIS — F419 Anxiety disorder, unspecified: Secondary | ICD-10-CM | POA: Diagnosis not present

## 2016-12-03 DIAGNOSIS — F329 Major depressive disorder, single episode, unspecified: Secondary | ICD-10-CM | POA: Diagnosis not present

## 2016-12-03 DIAGNOSIS — Z720 Tobacco use: Secondary | ICD-10-CM | POA: Diagnosis not present

## 2016-12-03 DIAGNOSIS — M069 Rheumatoid arthritis, unspecified: Secondary | ICD-10-CM | POA: Diagnosis not present

## 2016-12-03 DIAGNOSIS — R609 Edema, unspecified: Secondary | ICD-10-CM | POA: Diagnosis not present

## 2016-12-03 DIAGNOSIS — I10 Essential (primary) hypertension: Secondary | ICD-10-CM | POA: Diagnosis not present

## 2016-12-03 DIAGNOSIS — M329 Systemic lupus erythematosus, unspecified: Secondary | ICD-10-CM | POA: Diagnosis not present

## 2016-12-03 DIAGNOSIS — E785 Hyperlipidemia, unspecified: Secondary | ICD-10-CM | POA: Diagnosis not present

## 2016-12-03 DIAGNOSIS — R0789 Other chest pain: Secondary | ICD-10-CM | POA: Diagnosis not present

## 2016-12-03 DIAGNOSIS — E119 Type 2 diabetes mellitus without complications: Secondary | ICD-10-CM | POA: Diagnosis not present

## 2016-12-03 DIAGNOSIS — E1140 Type 2 diabetes mellitus with diabetic neuropathy, unspecified: Secondary | ICD-10-CM | POA: Diagnosis not present

## 2016-12-07 DIAGNOSIS — I119 Hypertensive heart disease without heart failure: Secondary | ICD-10-CM | POA: Diagnosis not present

## 2016-12-13 DIAGNOSIS — F430 Acute stress reaction: Secondary | ICD-10-CM | POA: Diagnosis not present

## 2016-12-25 DIAGNOSIS — M359 Systemic involvement of connective tissue, unspecified: Secondary | ICD-10-CM | POA: Diagnosis not present

## 2016-12-25 DIAGNOSIS — N390 Urinary tract infection, site not specified: Secondary | ICD-10-CM | POA: Diagnosis not present

## 2017-01-01 DIAGNOSIS — M359 Systemic involvement of connective tissue, unspecified: Secondary | ICD-10-CM | POA: Diagnosis not present

## 2017-01-01 DIAGNOSIS — M797 Fibromyalgia: Secondary | ICD-10-CM | POA: Diagnosis not present

## 2017-01-01 DIAGNOSIS — Z79899 Other long term (current) drug therapy: Secondary | ICD-10-CM | POA: Diagnosis not present

## 2017-01-01 DIAGNOSIS — G894 Chronic pain syndrome: Secondary | ICD-10-CM | POA: Diagnosis not present

## 2017-01-02 DIAGNOSIS — F430 Acute stress reaction: Secondary | ICD-10-CM | POA: Diagnosis not present

## 2017-01-03 DIAGNOSIS — E119 Type 2 diabetes mellitus without complications: Secondary | ICD-10-CM | POA: Diagnosis not present

## 2017-01-03 DIAGNOSIS — E1140 Type 2 diabetes mellitus with diabetic neuropathy, unspecified: Secondary | ICD-10-CM | POA: Diagnosis not present

## 2017-01-03 DIAGNOSIS — F418 Other specified anxiety disorders: Secondary | ICD-10-CM | POA: Diagnosis not present

## 2017-01-03 DIAGNOSIS — Z720 Tobacco use: Secondary | ICD-10-CM | POA: Diagnosis not present

## 2017-01-03 DIAGNOSIS — M329 Systemic lupus erythematosus, unspecified: Secondary | ICD-10-CM | POA: Diagnosis not present

## 2017-01-03 DIAGNOSIS — I10 Essential (primary) hypertension: Secondary | ICD-10-CM | POA: Diagnosis not present

## 2017-01-03 DIAGNOSIS — E785 Hyperlipidemia, unspecified: Secondary | ICD-10-CM | POA: Diagnosis not present

## 2017-01-03 DIAGNOSIS — R609 Edema, unspecified: Secondary | ICD-10-CM | POA: Diagnosis not present

## 2017-02-25 DIAGNOSIS — F430 Acute stress reaction: Secondary | ICD-10-CM | POA: Diagnosis not present

## 2017-02-27 DIAGNOSIS — F430 Acute stress reaction: Secondary | ICD-10-CM | POA: Diagnosis not present

## 2017-03-06 DIAGNOSIS — Z720 Tobacco use: Secondary | ICD-10-CM | POA: Diagnosis not present

## 2017-03-06 DIAGNOSIS — R609 Edema, unspecified: Secondary | ICD-10-CM | POA: Diagnosis not present

## 2017-03-06 DIAGNOSIS — M329 Systemic lupus erythematosus, unspecified: Secondary | ICD-10-CM | POA: Diagnosis not present

## 2017-03-06 DIAGNOSIS — F418 Other specified anxiety disorders: Secondary | ICD-10-CM | POA: Diagnosis not present

## 2017-03-06 DIAGNOSIS — E119 Type 2 diabetes mellitus without complications: Secondary | ICD-10-CM | POA: Diagnosis not present

## 2017-03-06 DIAGNOSIS — B373 Candidiasis of vulva and vagina: Secondary | ICD-10-CM | POA: Diagnosis not present

## 2017-03-06 DIAGNOSIS — I10 Essential (primary) hypertension: Secondary | ICD-10-CM | POA: Diagnosis not present

## 2017-03-06 DIAGNOSIS — E785 Hyperlipidemia, unspecified: Secondary | ICD-10-CM | POA: Diagnosis not present

## 2017-03-06 DIAGNOSIS — K641 Second degree hemorrhoids: Secondary | ICD-10-CM | POA: Diagnosis not present

## 2017-03-06 DIAGNOSIS — E1140 Type 2 diabetes mellitus with diabetic neuropathy, unspecified: Secondary | ICD-10-CM | POA: Diagnosis not present

## 2017-03-13 DIAGNOSIS — F430 Acute stress reaction: Secondary | ICD-10-CM | POA: Diagnosis not present

## 2017-03-21 DIAGNOSIS — R311 Benign essential microscopic hematuria: Secondary | ICD-10-CM | POA: Diagnosis not present

## 2017-03-21 DIAGNOSIS — R310 Gross hematuria: Secondary | ICD-10-CM | POA: Diagnosis not present

## 2017-03-21 DIAGNOSIS — N3021 Other chronic cystitis with hematuria: Secondary | ICD-10-CM | POA: Diagnosis not present

## 2017-04-03 DIAGNOSIS — F418 Other specified anxiety disorders: Secondary | ICD-10-CM | POA: Diagnosis not present

## 2017-04-03 DIAGNOSIS — K641 Second degree hemorrhoids: Secondary | ICD-10-CM | POA: Diagnosis not present

## 2017-04-03 DIAGNOSIS — R202 Paresthesia of skin: Secondary | ICD-10-CM | POA: Diagnosis not present

## 2017-04-03 DIAGNOSIS — R609 Edema, unspecified: Secondary | ICD-10-CM | POA: Diagnosis not present

## 2017-04-03 DIAGNOSIS — M329 Systemic lupus erythematosus, unspecified: Secondary | ICD-10-CM | POA: Diagnosis not present

## 2017-04-03 DIAGNOSIS — Z720 Tobacco use: Secondary | ICD-10-CM | POA: Diagnosis not present

## 2017-04-03 DIAGNOSIS — E1140 Type 2 diabetes mellitus with diabetic neuropathy, unspecified: Secondary | ICD-10-CM | POA: Diagnosis not present

## 2017-04-03 DIAGNOSIS — E119 Type 2 diabetes mellitus without complications: Secondary | ICD-10-CM | POA: Diagnosis not present

## 2017-04-03 DIAGNOSIS — I10 Essential (primary) hypertension: Secondary | ICD-10-CM | POA: Diagnosis not present

## 2017-04-03 DIAGNOSIS — E785 Hyperlipidemia, unspecified: Secondary | ICD-10-CM | POA: Diagnosis not present

## 2017-04-03 DIAGNOSIS — R310 Gross hematuria: Secondary | ICD-10-CM | POA: Diagnosis not present

## 2017-04-04 DIAGNOSIS — R202 Paresthesia of skin: Secondary | ICD-10-CM | POA: Diagnosis not present

## 2017-04-11 DIAGNOSIS — R3915 Urgency of urination: Secondary | ICD-10-CM | POA: Diagnosis not present

## 2017-04-11 DIAGNOSIS — R350 Frequency of micturition: Secondary | ICD-10-CM | POA: Diagnosis not present

## 2017-04-11 DIAGNOSIS — R310 Gross hematuria: Secondary | ICD-10-CM | POA: Diagnosis not present

## 2017-04-11 DIAGNOSIS — N3021 Other chronic cystitis with hematuria: Secondary | ICD-10-CM | POA: Diagnosis not present

## 2017-04-16 DIAGNOSIS — R202 Paresthesia of skin: Secondary | ICD-10-CM | POA: Diagnosis not present

## 2017-04-18 ENCOUNTER — Emergency Department (HOSPITAL_COMMUNITY): Payer: Medicare Other

## 2017-04-18 ENCOUNTER — Encounter (HOSPITAL_COMMUNITY): Payer: Self-pay | Admitting: Family Medicine

## 2017-04-18 ENCOUNTER — Emergency Department (HOSPITAL_COMMUNITY)
Admission: EM | Admit: 2017-04-18 | Discharge: 2017-04-18 | Disposition: A | Discharge status: Home / Self Care | Payer: Medicare Other | Attending: Emergency Medicine | Admitting: Emergency Medicine

## 2017-04-18 DIAGNOSIS — F17210 Nicotine dependence, cigarettes, uncomplicated: Secondary | ICD-10-CM | POA: Diagnosis not present

## 2017-04-18 DIAGNOSIS — I10 Essential (primary) hypertension: Secondary | ICD-10-CM | POA: Insufficient documentation

## 2017-04-18 DIAGNOSIS — R1032 Left lower quadrant pain: Secondary | ICD-10-CM | POA: Insufficient documentation

## 2017-04-18 DIAGNOSIS — Z79899 Other long term (current) drug therapy: Secondary | ICD-10-CM | POA: Insufficient documentation

## 2017-04-18 DIAGNOSIS — R1030 Lower abdominal pain, unspecified: Secondary | ICD-10-CM

## 2017-04-18 DIAGNOSIS — R112 Nausea with vomiting, unspecified: Secondary | ICD-10-CM | POA: Diagnosis not present

## 2017-04-18 LAB — URINALYSIS, ROUTINE W REFLEX MICROSCOPIC
Bilirubin Urine: NEGATIVE
Glucose, UA: 50 mg/dL — AB
Ketones, ur: 20 mg/dL — AB
Leukocytes, UA: NEGATIVE
Nitrite: NEGATIVE
Protein, ur: 30 mg/dL — AB
Specific Gravity, Urine: 1.017 (ref 1.005–1.030)
pH: 8 (ref 5.0–8.0)

## 2017-04-18 LAB — LIPASE, BLOOD: Lipase: 18 U/L (ref 11–51)

## 2017-04-18 LAB — COMPREHENSIVE METABOLIC PANEL
ALT: 32 U/L (ref 14–54)
AST: 43 U/L — ABNORMAL HIGH (ref 15–41)
Albumin: 4.6 g/dL (ref 3.5–5.0)
Alkaline Phosphatase: 96 U/L (ref 38–126)
Anion gap: 13 (ref 5–15)
BUN: 10 mg/dL (ref 6–20)
CO2: 24 mmol/L (ref 22–32)
Calcium: 9.7 mg/dL (ref 8.9–10.3)
Chloride: 105 mmol/L (ref 101–111)
Creatinine, Ser: 1.07 mg/dL — ABNORMAL HIGH (ref 0.44–1.00)
GFR calc Af Amer: >60 mL/min (ref >60)
GFR calc non Af Amer: >60 mL/min (ref >60)
Glucose, Bld: 213 mg/dL — ABNORMAL HIGH (ref 65–99)
Potassium: 4.3 mmol/L (ref 3.5–5.1)
Sodium: 142 mmol/L (ref 135–145)
Total Bilirubin: 1.7 mg/dL — ABNORMAL HIGH (ref 0.3–1.2)
Total Protein: 9.2 g/dL — ABNORMAL HIGH (ref 6.5–8.1)

## 2017-04-18 LAB — CBC WITH DIFFERENTIAL/PLATELET
Basophils Absolute: 0 10*3/uL (ref 0.0–0.1)
Basophils Relative: 0 %
Eosinophils Absolute: 0 10*3/uL (ref 0.0–0.7)
Eosinophils Relative: 0 %
HCT: 41.5 % (ref 36.0–46.0)
Hemoglobin: 14.1 g/dL (ref 12.0–15.0)
Lymphocytes Relative: 17 %
Lymphs Abs: 1.9 10*3/uL (ref 0.7–4.0)
MCH: 26.6 pg (ref 26.0–34.0)
MCHC: 34 g/dL (ref 30.0–36.0)
MCV: 78.2 fL (ref 78.0–100.0)
Monocytes Absolute: 0.8 10*3/uL (ref 0.1–1.0)
Monocytes Relative: 7 %
Neutro Abs: 8.1 10*3/uL — ABNORMAL HIGH (ref 1.7–7.7)
Neutrophils Relative %: 76 %
Platelets: 255 10*3/uL (ref 150–400)
RBC: 5.31 MIL/uL — ABNORMAL HIGH (ref 3.87–5.11)
RDW: 14.3 % (ref 11.5–15.5)
WBC: 10.8 10*3/uL — ABNORMAL HIGH (ref 4.0–10.5)

## 2017-04-18 MED ORDER — SODIUM CHLORIDE 0.9 % IV SOLN
INTRAVENOUS | Status: DC
  Administered 2017-04-18: 09:00:00 via INTRAVENOUS

## 2017-04-18 MED ORDER — HYDROMORPHONE HCL 1 MG/ML IJ SOLN
1.0000 mg | Freq: Once | INTRAMUSCULAR | Status: AC
  Administered 2017-04-18: 1 mg via INTRAVENOUS
  Filled 2017-04-18: qty 1

## 2017-04-18 MED ORDER — LORAZEPAM 2 MG/ML IJ SOLN
1.0000 mg | Freq: Once | INTRAMUSCULAR | Status: AC
  Administered 2017-04-18: 1 mg via INTRAVENOUS
  Filled 2017-04-18: qty 1

## 2017-04-18 MED ORDER — ONDANSETRON HCL 4 MG/2ML IJ SOLN
4.0000 mg | Freq: Once | INTRAMUSCULAR | Status: AC
  Administered 2017-04-18: 4 mg via INTRAVENOUS
  Filled 2017-04-18: qty 2

## 2017-04-18 MED ORDER — SODIUM CHLORIDE 0.9 % IV BOLUS (SEPSIS)
500.0000 mL | Freq: Once | INTRAVENOUS | Status: AC
  Administered 2017-04-18: 500 mL via INTRAVENOUS

## 2017-04-18 NOTE — Discharge Instructions (Signed)
Workup here today without any acute findings. Follow-up with your doctors. Return for any new or worse symptoms.

## 2017-04-18 NOTE — ED Notes (Signed)
PT aware of urine sample. Unable to provide at this time 

## 2017-04-18 NOTE — ED Provider Notes (Signed)
[place] DEPT Provider Note   CSN: [id number] Arrival date & time: 04/18/17  0551     History   Chief Complaint Chief Complaint  Patient presents with  . Abdominal Pain    HPI Victoria Holmes is a 36 y.o. female.  Patient transported from home by [facility]. Patient woke up and about an hour ago with nausea hyperventilating and left-sided abdominal pain. EMS stated patient has a history of anxiety usually takes Xanax but did not have that this morning. Patient was given Zofran ODT by EMS. Patient states his left lower quadrant abdominal pain time I saw her started 3 hours ago. Still initiated with nausea and vomiting no diarrhea.      Past Medical History:  Diagnosis Date  . Anemia    no current med.  . Anxiety   . Chronic tonsillitis 03/2012  . Depression   . Diabetes mellitus    diet-controlled  . Fibrocystic breast disease   . Headache(784.0)    migraines  . History of endometriosis   . Hyperlipidemia   . Hypertension    under control, has been on med. x 1 yr.  . Lupus   . Neuritis   . Panic attacks   . Raynauds disease   . Rheumatoid arthritis(714.0)   . Schizoaffective disorder ([place])   . Seizures ([place])    last seizure > 1 yr. ago  . Sinusitis   . Sleep disturbance   . Thyroid nodule     Patient Active Problem List   Diagnosis Date Noted  . Carpopedal spasm 02/01/2015  . Epilepsy ([place]) 11/24/2014  . Tobacco use disorder 07/17/2014  . Drug allergy, multiple 07/17/2014    Past Surgical History:  Procedure Laterality Date  . ABDOMINAL HYSTERECTOMY  6 yrs. ago   complete  . CHOLECYSTECTOMY  6-8 yrs. ago  . FOOT SURGERY  05/2011   right great toe  . TONSILLECTOMY  04/22/2012   Procedure: TONSILLECTOMY;  Surgeon: Rozetta Nunnery, MD;  Location: [place];  Service: ENT;  Laterality: N/A;    OB History    No data available       Home Medications    Prior to Admission medications   Medication Sig Start Date  End Date Taking? Authorizing Provider  ALPRAZolam Duanne Moron) 1 MG tablet Take 1 mg by mouth 3 (three) times daily as needed for anxiety.    Yes [provider]  diclofenac sodium (VOLTAREN) 1 % GEL Apply 2 g topically 4 (four) times daily as needed (for pain).   Yes [provider]  EPINEPHrine (EPIPEN JR) 0.15 MG/0.3ML injection Inject 0.15 mg into the muscle as needed.   Yes [provider]  FLUoxetine (PROZAC) 20 MG capsule Take 20 mg by mouth daily as needed (for mood).   Yes [provider]  furosemide (LASIX) 20 MG tablet Take 20 mg by mouth daily.   Yes [provider]  ibuprofen (ADVIL,MOTRIN) 200 MG tablet Take 200 mg by mouth every 6 (six) hours as needed for fever, headache, mild pain, moderate pain or cramping.   Yes [provider]  lurasidone (LATUDA) 80 MG TABS tablet Take 80 mg by mouth daily with breakfast.   Yes [provider]  prazosin (MINIPRESS) 5 MG capsule Take 5 mg by mouth at bedtime.   Yes [provider]  pregabalin (LYRICA) 100 MG capsule Take 100 mg by mouth 2 (two) times daily.   Yes [provider]  propranolol (INDERAL) 20  MG tablet Take 20 mg by mouth 2 (two) times daily.   Yes [provider]  tiZANidine (ZANAFLEX) 4 MG tablet Take 4 mg by mouth every 6 (six) hours as needed for muscle spasms.   Yes [provider]    Family History Family History  Problem Relation Age of Onset  . Diabetes Paternal Grandmother   . Hypertension Paternal Grandmother   . Kidney disease Paternal Grandmother   . Heart attack Maternal Grandmother        late 62's  . Arthritis Maternal Grandfather   . Cancer Maternal Grandfather        lung  . Lupus Unknown     Social History Social History  Substance Use Topics  . Smoking status: Current Every Day Smoker    Years: 3.00    Types: Cigarettes  . Smokeless tobacco: Never Used     Comment: 3 cig./day  . Alcohol use 0.0 oz/week      Comment: occasionally     Allergies   Amoxicillin; Azithromycin; Darvocet [propoxyphene n-acetaminophen]; Grapeseed extract [nutritional supplements]; Kiwi extract; Latex; Macrolides and ketolides; Morphine and related; Sulfa antibiotics; Klonopin [clonazepam]; Penicillins; Zoloft [sertraline hcl]; Contrast media [iodinated diagnostic agents]; Flagyl [metronidazole]; Adhesive [tape]; Concerta [methylphenidate]; Lexapro [escitalopram oxalate]; and Lortab [hydrocodone-acetaminophen]   Review of Systems Review of Systems  Constitutional: Negative for fever.  HENT: Negative for congestion.   Eyes: Negative for redness.  Respiratory: Negative for shortness of breath.   Cardiovascular: Negative for chest pain.  Gastrointestinal: Positive for abdominal pain, nausea and vomiting.  Genitourinary: Negative for dysuria and vaginal discharge.  Musculoskeletal: Positive for arthralgias and back pain.  Neurological: Negative for headaches.  Hematological: Does not bruise/bleed easily.  Psychiatric/Behavioral: Negative for confusion.     Physical Exam Updated Vital Signs BP 129/75 (BP Location: Left Arm)   Pulse (!) 58   Temp (!) 97.4 F (36.3 C) (Oral)   Resp 14   SpO2 100%   Physical Exam  Constitutional: She is oriented to person, place, and time. She appears well-developed and well-nourished. No distress.  HENT:  Head: Normocephalic and atraumatic.  Mouth/Throat: Oropharynx is clear and moist.  Eyes: Pupils are equal, round, and reactive to light. Conjunctivae and EOM are normal.  Neck: Normal range of motion. Neck supple.  Cardiovascular: Normal rate, regular rhythm and normal heart sounds.   Pulmonary/Chest: Effort normal and breath sounds normal. No respiratory distress.  Abdominal: Soft. Bowel sounds are normal. There is tenderness.  Tender to palpation left lower quadrant.  Musculoskeletal: Normal range of motion. She exhibits no edema.  Neurological: She is alert and  oriented to person, place, and time. No cranial nerve deficit or sensory deficit. She exhibits normal muscle tone. Coordination normal.  Skin: Skin is warm.  Nursing note and vitals reviewed.    ED Treatments / Results  Labs (all labs ordered are listed, but only abnormal results are displayed) Labs Reviewed  COMPREHENSIVE METABOLIC PANEL - Abnormal; Notable for the following:       Result Value   Glucose, Bld 213 (*)    Creatinine, Ser 1.07 (*)    Total Protein 9.2 (*)    AST 43 (*)    Total Bilirubin 1.7 (*)    All other components within normal limits  CBC WITH DIFFERENTIAL/PLATELET - Abnormal; Notable for the following:    WBC 10.8 (*)    RBC 5.31 (*)    Neutro Abs 8.1 (*)    All other components within normal limits  URINALYSIS, ROUTINE W REFLEX MICROSCOPIC - Abnormal; Notable for the following:    Glucose, UA 50 (*)    Hgb urine dipstick MODERATE (*)    Ketones, ur 20 (*)    Protein, ur 30 (*)    Bacteria, UA RARE (*)    Squamous Epithelial / LPF 0-5 (*)    All other components within normal limits  LIPASE, BLOOD    EKG  EKG Interpretation None       Radiology Ct Abdomen Pelvis Wo Contrast  Result Date: 04/18/2017 CLINICAL DATA:  Lower abdominal pain with nausea and vomiting. Chronic hematuria EXAM: CT ABDOMEN AND PELVIS WITHOUT CONTRAST TECHNIQUE: Multidetector CT imaging of the abdomen and pelvis was performed following the standard protocol without oral or intravenous contrast material administration. COMPARISON:  April 03, 2017 FINDINGS: Lower chest: There is bibasilar atelectasis. Hepatobiliary: No focal liver lesions are apparent on this noncontrast enhanced study. Gallbladder is absent. There is no biliary duct dilatation. Pancreas: No pancreatic mass or inflammatory focus. Spleen: No splenic lesions are evident. Adrenals/Urinary Tract: Adrenals appear normal bilaterally. Kidneys bilaterally show no evident mass or hydronephrosis on either side. There is  no renal or ureteral calculus on either side. Urinary bladder is midline with slight urinary bladder wall thickening. Stomach/Bowel: There is no appreciable bowel wall or mesenteric thickening. No evident bowel obstruction. No free air or portal venous air. Vascular/Lymphatic: No aneurysm evident. No vascular lesions are appreciable on this noncontrast enhanced study. There are subcentimeter pelvic lymph nodes in the common femoral node chain bilaterally. There is no frank adenopathy in the abdomen or pelvis by size criteria. Reproductive: Uterus is absent.  No evident pelvic mass. Other: Appendix appears normal. No ascites or abscess evident in the abdomen or pelvis. There is a small ventral hernia containing only fat. Musculoskeletal: No evident blastic or lytic bone lesions. No intramuscular or abdominal wall lesion. IMPRESSION: 1. Mild urinary bladder wall thickening. Suspect a degree of cystitis. No renal or ureteral calculus. No hydronephrosis. 2.  Bibasilar lung atelectasis. 3.  No bowel obstruction.  No abscess.  Appendix appears normal. 4.  Gallbladder and uterus absent. Electronically Signed   By: Lowella Grip III M.D.   On: 04/18/2017 11:45    Procedures Procedures (including critical care time)  Medications Ordered in ED Medications  0.9 %  sodium chloride infusion ( Intravenous New Bag/Given 04/18/17 0830)  sodium chloride 0.9 % bolus 500 mL (500 mLs Intravenous New Bag/Given 04/18/17 0830)  ondansetron (ZOFRAN) injection 4 mg (4 mg Intravenous Given 04/18/17 0828)  HYDROmorphone (DILAUDID) injection 1 mg (1 mg Intravenous Given 04/18/17 0828)  LORazepam (ATIVAN) injection 1 mg (1 mg Intravenous Given 04/18/17 0829)     Initial Impression / Assessment and Plan / ED Course  I have reviewed the triage vital signs and the nursing notes.  Pertinent labs & imaging results that were available during my care of the patient were reviewed by me and considered in my medical decision making (see  chart for details).    Patient has had her gallbladder removed in his had a hysterectomy. Patient without any vaginal discharge. Urinalysis was negative for urinary tract infection. Patient had a mild leukocytosis. Patient had elevated bili room level of 1.7. Patient received IV fluids here pain medicine and medicine for nausea. With significant improvement. Patient eventually had CT scan of the abdomen due to the persistent abdominal pain. Patient has a history of CT IV contrast. So patient received CT without. No acute findings on  the CAT scan. Patient overall specific significant improvement.  Patient has a history of cystitis but as stated no evidence of urinary tract infection today. Patient just 2 days ago had cystoscopy done by urology. But she did not have any problems initially perhaps maybe that could've cause some discomfort here today. Either way patient nontoxic no acute distress can follow-up with her with her doctors.   Final Clinical Impressions(s) / ED Diagnoses   Final diagnoses:  Lower abdominal pain    New Prescriptions New Prescriptions   No medications on file     Fredia Sorrow, MD 04/18/17 1620

## 2017-04-18 NOTE — ED Triage Notes (Signed)
Patient is from home and transported via [facility]. Patient woke up about an hour ago nausea and hyperventilating. Patient has a history of anxiety and usually takes Xanax PRN but didn't this morning. Patient was given ZOFRAN 4mg  ODT.

## 2017-04-19 DIAGNOSIS — Z720 Tobacco use: Secondary | ICD-10-CM | POA: Diagnosis not present

## 2017-04-19 DIAGNOSIS — F418 Other specified anxiety disorders: Secondary | ICD-10-CM | POA: Diagnosis not present

## 2017-04-19 DIAGNOSIS — I10 Essential (primary) hypertension: Secondary | ICD-10-CM | POA: Diagnosis not present

## 2017-04-19 DIAGNOSIS — E785 Hyperlipidemia, unspecified: Secondary | ICD-10-CM | POA: Diagnosis not present

## 2017-04-19 DIAGNOSIS — R609 Edema, unspecified: Secondary | ICD-10-CM | POA: Diagnosis not present

## 2017-04-19 DIAGNOSIS — E119 Type 2 diabetes mellitus without complications: Secondary | ICD-10-CM | POA: Diagnosis not present

## 2017-04-19 DIAGNOSIS — E1140 Type 2 diabetes mellitus with diabetic neuropathy, unspecified: Secondary | ICD-10-CM | POA: Diagnosis not present

## 2017-04-19 DIAGNOSIS — R1084 Generalized abdominal pain: Secondary | ICD-10-CM | POA: Diagnosis not present

## 2017-04-19 DIAGNOSIS — M329 Systemic lupus erythematosus, unspecified: Secondary | ICD-10-CM | POA: Diagnosis not present

## 2017-04-26 DIAGNOSIS — E119 Type 2 diabetes mellitus without complications: Secondary | ICD-10-CM | POA: Diagnosis not present

## 2017-04-26 DIAGNOSIS — I10 Essential (primary) hypertension: Secondary | ICD-10-CM | POA: Diagnosis not present

## 2017-04-26 DIAGNOSIS — R609 Edema, unspecified: Secondary | ICD-10-CM | POA: Diagnosis not present

## 2017-04-26 DIAGNOSIS — R4189 Other symptoms and signs involving cognitive functions and awareness: Secondary | ICD-10-CM | POA: Diagnosis not present

## 2017-04-26 DIAGNOSIS — F418 Other specified anxiety disorders: Secondary | ICD-10-CM | POA: Diagnosis not present

## 2017-04-26 DIAGNOSIS — E1140 Type 2 diabetes mellitus with diabetic neuropathy, unspecified: Secondary | ICD-10-CM | POA: Diagnosis not present

## 2017-04-26 DIAGNOSIS — R87612 Low grade squamous intraepithelial lesion on cytologic smear of cervix (LGSIL): Secondary | ICD-10-CM | POA: Diagnosis not present

## 2017-04-26 DIAGNOSIS — Z124 Encounter for screening for malignant neoplasm of cervix: Secondary | ICD-10-CM | POA: Diagnosis not present

## 2017-04-26 DIAGNOSIS — M329 Systemic lupus erythematosus, unspecified: Secondary | ICD-10-CM | POA: Diagnosis not present

## 2017-04-26 DIAGNOSIS — N760 Acute vaginitis: Secondary | ICD-10-CM | POA: Diagnosis not present

## 2017-04-26 DIAGNOSIS — Z720 Tobacco use: Secondary | ICD-10-CM | POA: Diagnosis not present

## 2017-04-26 DIAGNOSIS — E785 Hyperlipidemia, unspecified: Secondary | ICD-10-CM | POA: Diagnosis not present

## 2017-04-26 DIAGNOSIS — R1084 Generalized abdominal pain: Secondary | ICD-10-CM | POA: Diagnosis not present

## 2017-04-30 DIAGNOSIS — M79602 Pain in left arm: Secondary | ICD-10-CM | POA: Diagnosis not present

## 2017-05-01 ENCOUNTER — Other Ambulatory Visit: Payer: Self-pay | Admitting: Internal Medicine

## 2017-05-01 DIAGNOSIS — N760 Acute vaginitis: Secondary | ICD-10-CM

## 2017-05-03 ENCOUNTER — Ambulatory Visit
Admission: RE | Admit: 2017-05-03 | Discharge: 2017-05-03 | Disposition: A | Discharge status: Home / Self Care | Payer: Medicare Other | Source: Ambulatory Visit | Attending: Internal Medicine | Admitting: Internal Medicine

## 2017-05-03 DIAGNOSIS — N760 Acute vaginitis: Secondary | ICD-10-CM

## 2017-05-03 DIAGNOSIS — R102 Pelvic and perineal pain: Secondary | ICD-10-CM | POA: Diagnosis not present

## 2017-05-06 DIAGNOSIS — R195 Other fecal abnormalities: Secondary | ICD-10-CM | POA: Diagnosis not present

## 2017-05-06 DIAGNOSIS — R1030 Lower abdominal pain, unspecified: Secondary | ICD-10-CM | POA: Diagnosis not present

## 2017-05-06 DIAGNOSIS — K5900 Constipation, unspecified: Secondary | ICD-10-CM | POA: Diagnosis not present

## 2017-05-06 DIAGNOSIS — K649 Unspecified hemorrhoids: Secondary | ICD-10-CM | POA: Diagnosis not present

## 2017-05-10 DIAGNOSIS — R195 Other fecal abnormalities: Secondary | ICD-10-CM | POA: Diagnosis not present

## 2017-05-10 DIAGNOSIS — D126 Benign neoplasm of colon, unspecified: Secondary | ICD-10-CM | POA: Diagnosis not present

## 2017-05-10 DIAGNOSIS — K5730 Diverticulosis of large intestine without perforation or abscess without bleeding: Secondary | ICD-10-CM | POA: Diagnosis not present

## 2017-05-10 DIAGNOSIS — R1030 Lower abdominal pain, unspecified: Secondary | ICD-10-CM | POA: Diagnosis not present

## 2017-05-10 DIAGNOSIS — K649 Unspecified hemorrhoids: Secondary | ICD-10-CM | POA: Diagnosis not present

## 2017-05-14 DIAGNOSIS — D126 Benign neoplasm of colon, unspecified: Secondary | ICD-10-CM | POA: Diagnosis not present

## 2017-05-29 DIAGNOSIS — F430 Acute stress reaction: Secondary | ICD-10-CM | POA: Diagnosis not present

## 2017-06-05 DIAGNOSIS — Z720 Tobacco use: Secondary | ICD-10-CM | POA: Diagnosis not present

## 2017-06-05 DIAGNOSIS — E119 Type 2 diabetes mellitus without complications: Secondary | ICD-10-CM | POA: Diagnosis not present

## 2017-06-05 DIAGNOSIS — E785 Hyperlipidemia, unspecified: Secondary | ICD-10-CM | POA: Diagnosis not present

## 2017-06-05 DIAGNOSIS — N760 Acute vaginitis: Secondary | ICD-10-CM | POA: Diagnosis not present

## 2017-06-05 DIAGNOSIS — E1140 Type 2 diabetes mellitus with diabetic neuropathy, unspecified: Secondary | ICD-10-CM | POA: Diagnosis not present

## 2017-06-05 DIAGNOSIS — R609 Edema, unspecified: Secondary | ICD-10-CM | POA: Diagnosis not present

## 2017-06-05 DIAGNOSIS — I10 Essential (primary) hypertension: Secondary | ICD-10-CM | POA: Diagnosis not present

## 2017-06-05 DIAGNOSIS — F418 Other specified anxiety disorders: Secondary | ICD-10-CM | POA: Diagnosis not present

## 2017-06-05 DIAGNOSIS — M329 Systemic lupus erythematosus, unspecified: Secondary | ICD-10-CM | POA: Diagnosis not present

## 2017-06-19 DIAGNOSIS — F430 Acute stress reaction: Secondary | ICD-10-CM | POA: Diagnosis not present

## 2017-06-21 DIAGNOSIS — Z86010 Personal history of colonic polyps: Secondary | ICD-10-CM | POA: Diagnosis not present

## 2017-06-21 DIAGNOSIS — K5900 Constipation, unspecified: Secondary | ICD-10-CM | POA: Diagnosis not present

## 2017-06-21 DIAGNOSIS — K644 Residual hemorrhoidal skin tags: Secondary | ICD-10-CM | POA: Diagnosis not present

## 2017-06-21 DIAGNOSIS — R1030 Lower abdominal pain, unspecified: Secondary | ICD-10-CM | POA: Diagnosis not present

## 2017-07-06 DIAGNOSIS — M79602 Pain in left arm: Secondary | ICD-10-CM | POA: Diagnosis not present

## 2017-07-11 DIAGNOSIS — R3915 Urgency of urination: Secondary | ICD-10-CM | POA: Diagnosis not present

## 2017-07-11 DIAGNOSIS — R311 Benign essential microscopic hematuria: Secondary | ICD-10-CM | POA: Diagnosis not present

## 2017-07-11 DIAGNOSIS — N3021 Other chronic cystitis with hematuria: Secondary | ICD-10-CM | POA: Diagnosis not present

## 2017-07-11 DIAGNOSIS — R350 Frequency of micturition: Secondary | ICD-10-CM | POA: Diagnosis not present

## 2017-07-26 DIAGNOSIS — N870 Mild cervical dysplasia: Secondary | ICD-10-CM | POA: Diagnosis not present

## 2017-07-26 DIAGNOSIS — R87619 Unspecified abnormal cytological findings in specimens from cervix uteri: Secondary | ICD-10-CM | POA: Diagnosis not present

## 2017-08-14 DIAGNOSIS — K5900 Constipation, unspecified: Secondary | ICD-10-CM | POA: Diagnosis not present

## 2017-08-14 DIAGNOSIS — R1030 Lower abdominal pain, unspecified: Secondary | ICD-10-CM | POA: Diagnosis not present

## 2017-08-22 DIAGNOSIS — R202 Paresthesia of skin: Secondary | ICD-10-CM | POA: Diagnosis not present

## 2017-08-22 DIAGNOSIS — M79602 Pain in left arm: Secondary | ICD-10-CM | POA: Diagnosis not present

## 2017-09-04 DIAGNOSIS — N760 Acute vaginitis: Secondary | ICD-10-CM | POA: Diagnosis not present

## 2017-09-04 DIAGNOSIS — E1140 Type 2 diabetes mellitus with diabetic neuropathy, unspecified: Secondary | ICD-10-CM | POA: Diagnosis not present

## 2017-09-04 DIAGNOSIS — E119 Type 2 diabetes mellitus without complications: Secondary | ICD-10-CM | POA: Diagnosis not present

## 2017-09-04 DIAGNOSIS — I10 Essential (primary) hypertension: Secondary | ICD-10-CM | POA: Diagnosis not present

## 2017-09-04 DIAGNOSIS — M329 Systemic lupus erythematosus, unspecified: Secondary | ICD-10-CM | POA: Diagnosis not present

## 2017-09-04 DIAGNOSIS — R609 Edema, unspecified: Secondary | ICD-10-CM | POA: Diagnosis not present

## 2017-09-04 DIAGNOSIS — Z720 Tobacco use: Secondary | ICD-10-CM | POA: Diagnosis not present

## 2017-09-04 DIAGNOSIS — F418 Other specified anxiety disorders: Secondary | ICD-10-CM | POA: Diagnosis not present

## 2017-09-04 DIAGNOSIS — E785 Hyperlipidemia, unspecified: Secondary | ICD-10-CM | POA: Diagnosis not present

## 2017-09-26 DIAGNOSIS — M47812 Spondylosis without myelopathy or radiculopathy, cervical region: Secondary | ICD-10-CM | POA: Diagnosis not present

## 2017-11-12 DIAGNOSIS — M79602 Pain in left arm: Secondary | ICD-10-CM | POA: Diagnosis not present

## 2017-11-13 DIAGNOSIS — R609 Edema, unspecified: Secondary | ICD-10-CM | POA: Diagnosis not present

## 2017-11-13 DIAGNOSIS — I10 Essential (primary) hypertension: Secondary | ICD-10-CM | POA: Diagnosis not present

## 2017-11-13 DIAGNOSIS — Z720 Tobacco use: Secondary | ICD-10-CM | POA: Diagnosis not present

## 2017-11-13 DIAGNOSIS — F418 Other specified anxiety disorders: Secondary | ICD-10-CM | POA: Diagnosis not present

## 2017-11-13 DIAGNOSIS — E1140 Type 2 diabetes mellitus with diabetic neuropathy, unspecified: Secondary | ICD-10-CM | POA: Diagnosis not present

## 2017-11-13 DIAGNOSIS — E785 Hyperlipidemia, unspecified: Secondary | ICD-10-CM | POA: Diagnosis not present

## 2017-11-13 DIAGNOSIS — E119 Type 2 diabetes mellitus without complications: Secondary | ICD-10-CM | POA: Diagnosis not present

## 2017-11-13 DIAGNOSIS — M329 Systemic lupus erythematosus, unspecified: Secondary | ICD-10-CM | POA: Diagnosis not present

## 2017-12-11 DIAGNOSIS — R609 Edema, unspecified: Secondary | ICD-10-CM | POA: Diagnosis not present

## 2017-12-11 DIAGNOSIS — H1011 Acute atopic conjunctivitis, right eye: Secondary | ICD-10-CM | POA: Diagnosis not present

## 2017-12-11 DIAGNOSIS — Z0000 Encounter for general adult medical examination without abnormal findings: Secondary | ICD-10-CM | POA: Diagnosis not present

## 2017-12-11 DIAGNOSIS — Z720 Tobacco use: Secondary | ICD-10-CM | POA: Diagnosis not present

## 2017-12-11 DIAGNOSIS — F418 Other specified anxiety disorders: Secondary | ICD-10-CM | POA: Diagnosis not present

## 2017-12-11 DIAGNOSIS — E119 Type 2 diabetes mellitus without complications: Secondary | ICD-10-CM | POA: Diagnosis not present

## 2017-12-11 DIAGNOSIS — E785 Hyperlipidemia, unspecified: Secondary | ICD-10-CM | POA: Diagnosis not present

## 2017-12-11 DIAGNOSIS — I10 Essential (primary) hypertension: Secondary | ICD-10-CM | POA: Diagnosis not present

## 2017-12-11 DIAGNOSIS — E1140 Type 2 diabetes mellitus with diabetic neuropathy, unspecified: Secondary | ICD-10-CM | POA: Diagnosis not present

## 2017-12-11 DIAGNOSIS — M329 Systemic lupus erythematosus, unspecified: Secondary | ICD-10-CM | POA: Diagnosis not present

## 2017-12-20 DIAGNOSIS — M25552 Pain in left hip: Secondary | ICD-10-CM | POA: Diagnosis not present

## 2017-12-20 DIAGNOSIS — M25551 Pain in right hip: Secondary | ICD-10-CM | POA: Diagnosis not present

## 2017-12-20 DIAGNOSIS — M797 Fibromyalgia: Secondary | ICD-10-CM | POA: Diagnosis not present

## 2017-12-20 DIAGNOSIS — G8929 Other chronic pain: Secondary | ICD-10-CM | POA: Diagnosis not present

## 2017-12-20 DIAGNOSIS — M25561 Pain in right knee: Secondary | ICD-10-CM | POA: Diagnosis not present

## 2017-12-20 DIAGNOSIS — M545 Low back pain: Secondary | ICD-10-CM | POA: Diagnosis not present

## 2017-12-20 DIAGNOSIS — M25562 Pain in left knee: Secondary | ICD-10-CM | POA: Diagnosis not present

## 2017-12-20 DIAGNOSIS — M542 Cervicalgia: Secondary | ICD-10-CM | POA: Diagnosis not present

## 2017-12-20 DIAGNOSIS — M5412 Radiculopathy, cervical region: Secondary | ICD-10-CM | POA: Diagnosis not present

## 2017-12-24 DIAGNOSIS — R29818 Other symptoms and signs involving the nervous system: Secondary | ICD-10-CM | POA: Diagnosis not present

## 2017-12-24 DIAGNOSIS — M797 Fibromyalgia: Secondary | ICD-10-CM | POA: Diagnosis not present

## 2017-12-25 DIAGNOSIS — M9903 Segmental and somatic dysfunction of lumbar region: Secondary | ICD-10-CM | POA: Diagnosis not present

## 2017-12-25 DIAGNOSIS — M5136 Other intervertebral disc degeneration, lumbar region: Secondary | ICD-10-CM | POA: Diagnosis not present

## 2017-12-25 DIAGNOSIS — M9904 Segmental and somatic dysfunction of sacral region: Secondary | ICD-10-CM | POA: Diagnosis not present

## 2017-12-25 DIAGNOSIS — M9905 Segmental and somatic dysfunction of pelvic region: Secondary | ICD-10-CM | POA: Diagnosis not present

## 2017-12-30 DIAGNOSIS — G35 Multiple sclerosis: Secondary | ICD-10-CM | POA: Diagnosis not present

## 2018-01-02 DIAGNOSIS — M9904 Segmental and somatic dysfunction of sacral region: Secondary | ICD-10-CM | POA: Diagnosis not present

## 2018-01-02 DIAGNOSIS — M9903 Segmental and somatic dysfunction of lumbar region: Secondary | ICD-10-CM | POA: Diagnosis not present

## 2018-01-02 DIAGNOSIS — M9905 Segmental and somatic dysfunction of pelvic region: Secondary | ICD-10-CM | POA: Diagnosis not present

## 2018-01-02 DIAGNOSIS — M5136 Other intervertebral disc degeneration, lumbar region: Secondary | ICD-10-CM | POA: Diagnosis not present

## 2018-01-03 DIAGNOSIS — M542 Cervicalgia: Secondary | ICD-10-CM | POA: Diagnosis not present

## 2018-01-03 DIAGNOSIS — M25551 Pain in right hip: Secondary | ICD-10-CM | POA: Diagnosis not present

## 2018-01-03 DIAGNOSIS — M25561 Pain in right knee: Secondary | ICD-10-CM | POA: Diagnosis not present

## 2018-01-03 DIAGNOSIS — F4310 Post-traumatic stress disorder, unspecified: Secondary | ICD-10-CM | POA: Diagnosis not present

## 2018-01-03 DIAGNOSIS — M25552 Pain in left hip: Secondary | ICD-10-CM | POA: Diagnosis not present

## 2018-01-03 DIAGNOSIS — G894 Chronic pain syndrome: Secondary | ICD-10-CM | POA: Diagnosis not present

## 2018-01-03 DIAGNOSIS — M25562 Pain in left knee: Secondary | ICD-10-CM | POA: Diagnosis not present

## 2018-01-03 DIAGNOSIS — M545 Low back pain: Secondary | ICD-10-CM | POA: Diagnosis not present

## 2018-01-22 IMAGING — US US PELVIS COMPLETE TRANSABD/TRANSVAG
1 series · 14 of 23 positions shown · non-contrast
Comparison: None

CLINICAL DATA: Acute generalized pelvic pain.

EXAM:
TRANSABDOMINAL AND TRANSVAGINAL ULTRASOUND OF PELVIS
TECHNIQUE: Both transabdominal and transvaginal ultrasound examinations of the
pelvis were performed. Transabdominal technique was performed for
global imaging of the pelvis including uterus, ovaries, adnexal
regions, and pelvic cul-de-sac. It was necessary to proceed with
endovaginal exam following the transabdominal exam to visualize the
adnexal regions.

[Series 1: us pelvis complete transabd/transvag · 0.17mm/px · 14 of 23 slices shown]
[im 1/23]
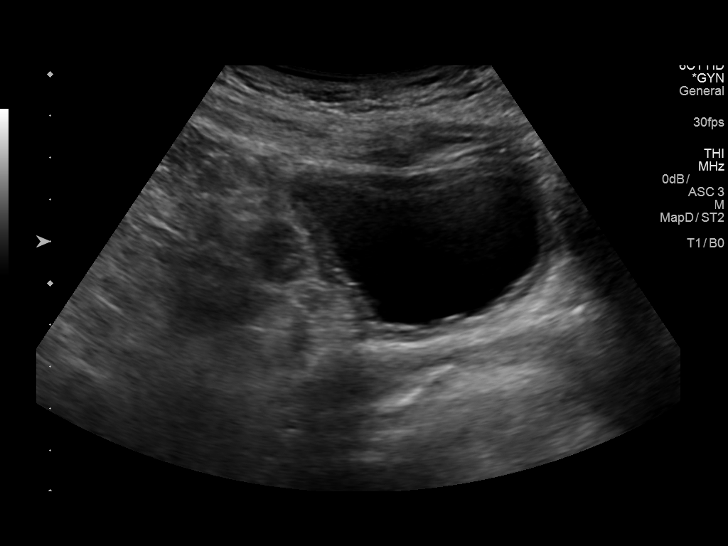
[im 3/23]
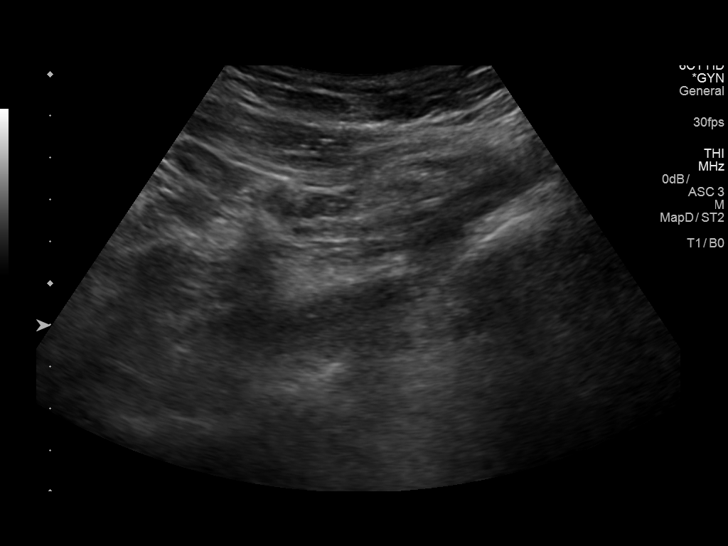
[im 5/23]
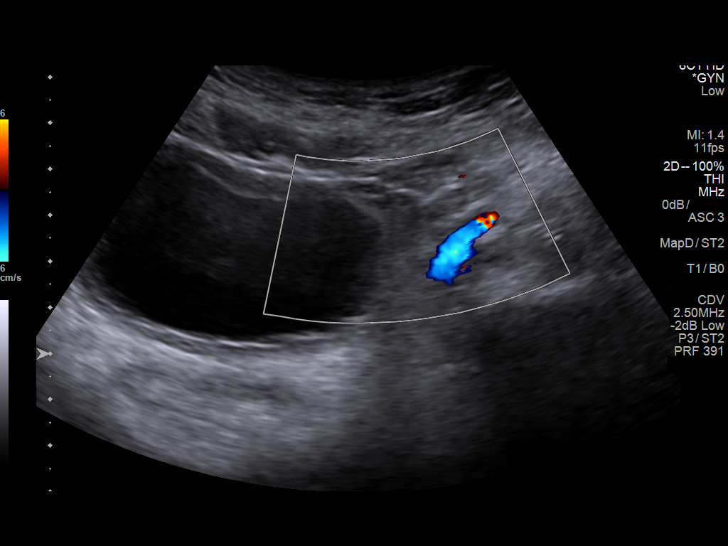
[im 6/23]
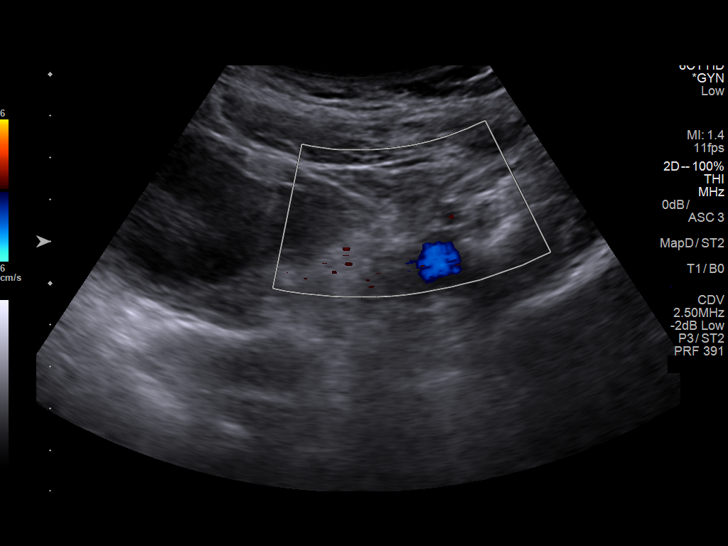
[im 8/23]
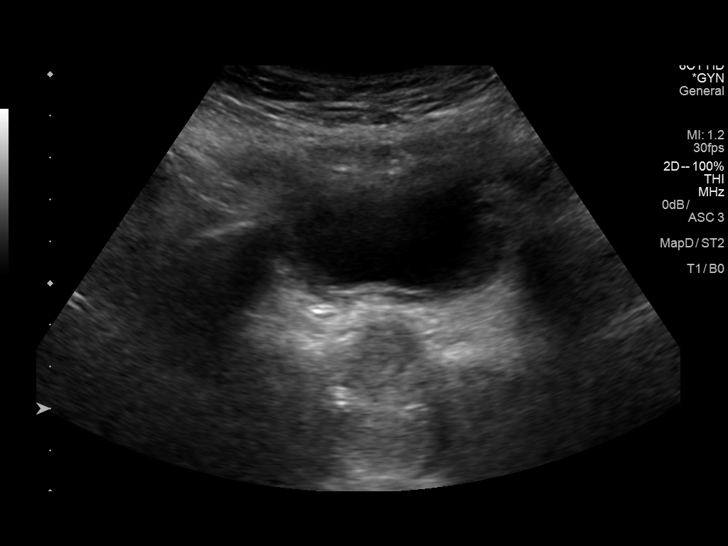
[im 10/23]
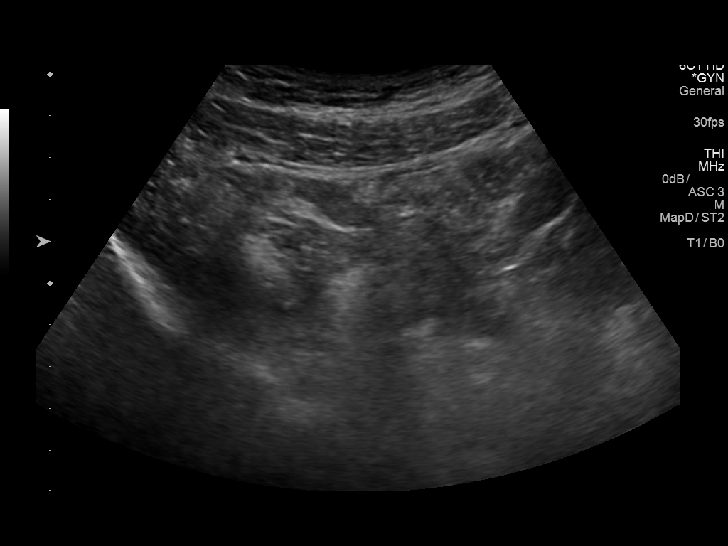
[im 11/23]
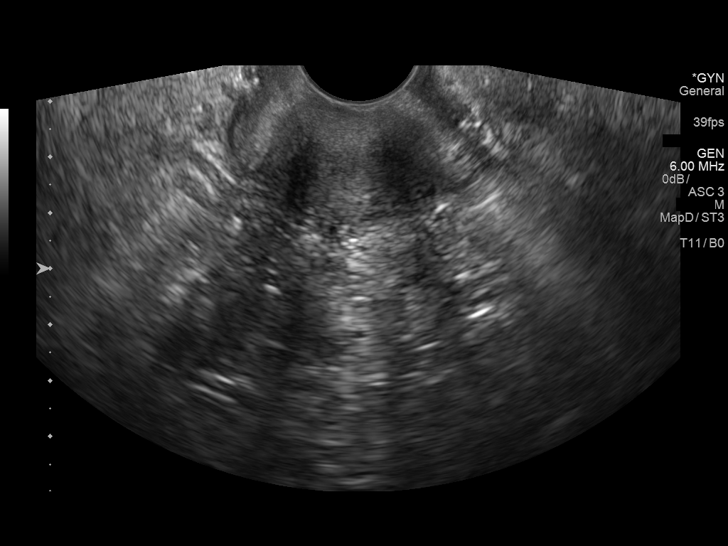
[im 13/23]
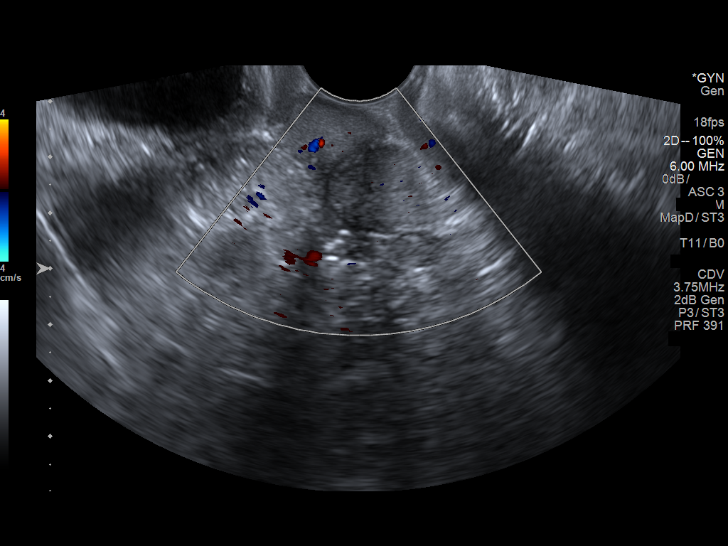
[im 14/23]
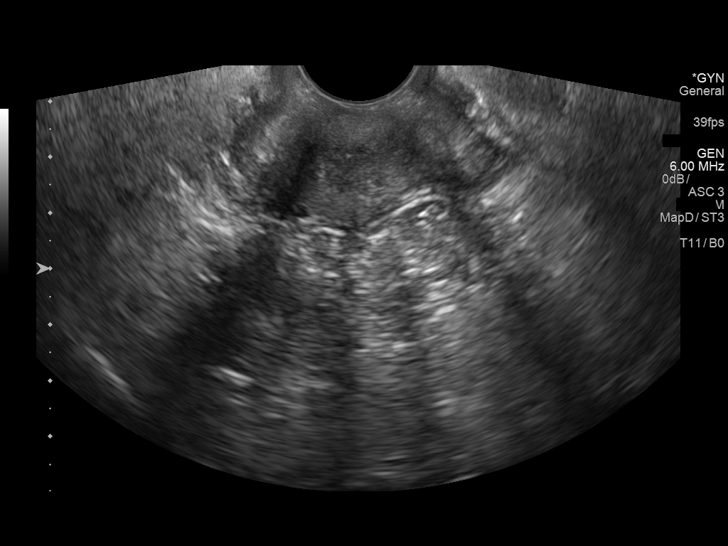
[im 16/23]
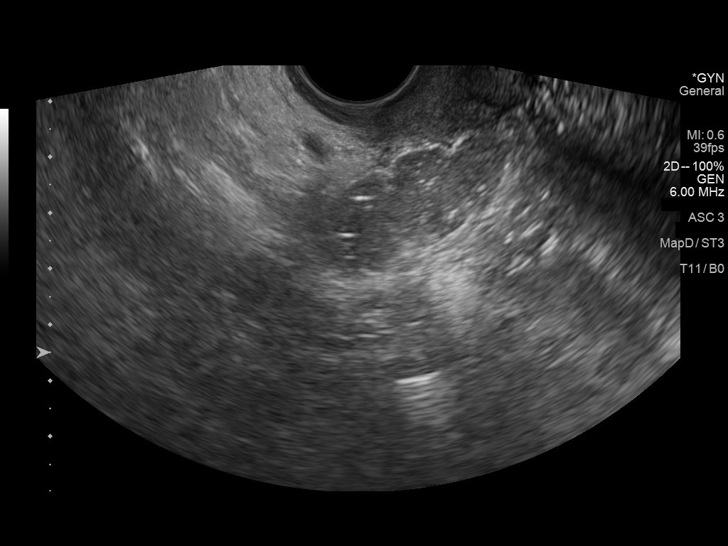
[im 18/23]
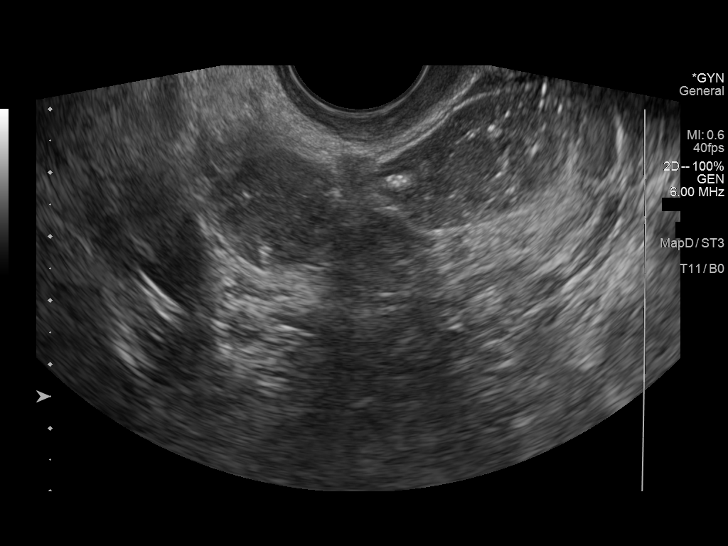
[im 19/23]
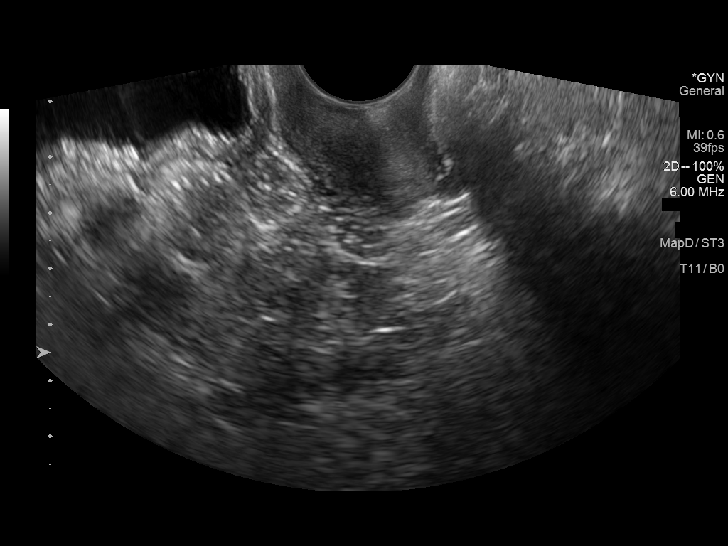
[im 21/23]
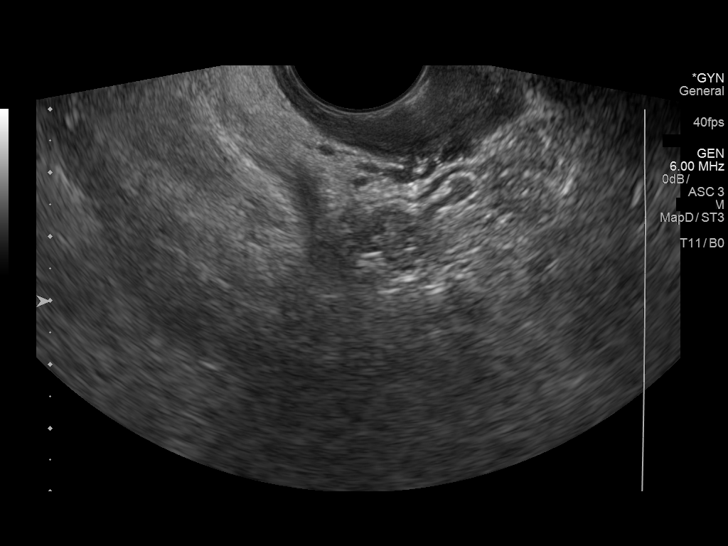
[im 23/23]
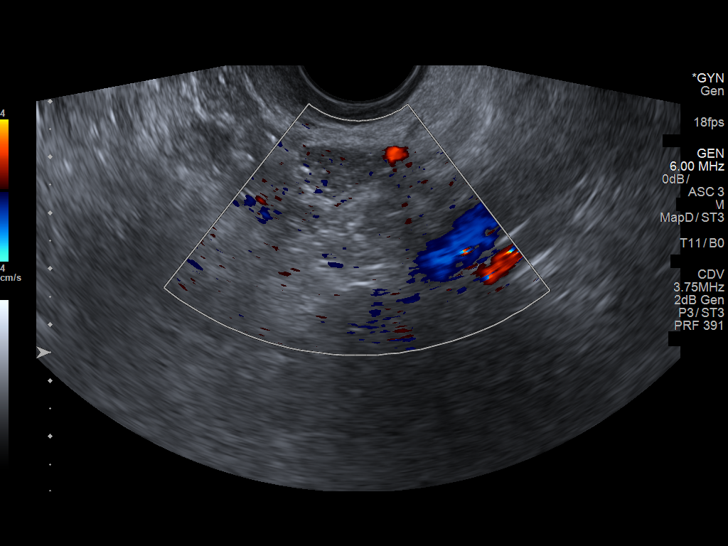

[14 of 23 positions shown; findings below may reference images not displayed]

FINDINGS: Status post hysterectomy.

Status post bilateral oophorectomy. No adnexal masses or other
abnormality is noted. Visualized portion of residual cervix is
unremarkable.

Other findings

No abnormal free fluid.
IMPRESSION: Status post hysterectomy and bilateral oophorectomy. No other
abnormality seen in the pelvis.

## 2018-01-30 DIAGNOSIS — F4310 Post-traumatic stress disorder, unspecified: Secondary | ICD-10-CM | POA: Diagnosis not present

## 2018-02-03 DIAGNOSIS — R1030 Lower abdominal pain, unspecified: Secondary | ICD-10-CM | POA: Diagnosis not present

## 2018-02-03 DIAGNOSIS — M797 Fibromyalgia: Secondary | ICD-10-CM | POA: Diagnosis not present

## 2018-02-03 DIAGNOSIS — R9402 Abnormal brain scan: Secondary | ICD-10-CM | POA: Diagnosis not present

## 2018-02-03 DIAGNOSIS — K581 Irritable bowel syndrome with constipation: Secondary | ICD-10-CM | POA: Diagnosis not present

## 2018-02-03 DIAGNOSIS — Z86010 Personal history of colonic polyps: Secondary | ICD-10-CM | POA: Diagnosis not present

## 2018-02-21 DIAGNOSIS — G43909 Migraine, unspecified, not intractable, without status migrainosus: Secondary | ICD-10-CM | POA: Diagnosis not present

## 2018-02-21 DIAGNOSIS — H5315 Visual distortions of shape and size: Secondary | ICD-10-CM | POA: Diagnosis not present

## 2018-02-21 DIAGNOSIS — G43001 Migraine without aura, not intractable, with status migrainosus: Secondary | ICD-10-CM | POA: Diagnosis not present

## 2018-03-05 DIAGNOSIS — F4310 Post-traumatic stress disorder, unspecified: Secondary | ICD-10-CM | POA: Diagnosis not present

## 2018-03-12 DIAGNOSIS — M329 Systemic lupus erythematosus, unspecified: Secondary | ICD-10-CM | POA: Diagnosis not present

## 2018-03-12 DIAGNOSIS — Z720 Tobacco use: Secondary | ICD-10-CM | POA: Diagnosis not present

## 2018-03-12 DIAGNOSIS — F418 Other specified anxiety disorders: Secondary | ICD-10-CM | POA: Diagnosis not present

## 2018-03-12 DIAGNOSIS — E785 Hyperlipidemia, unspecified: Secondary | ICD-10-CM | POA: Diagnosis not present

## 2018-03-12 DIAGNOSIS — I10 Essential (primary) hypertension: Secondary | ICD-10-CM | POA: Diagnosis not present

## 2018-03-12 DIAGNOSIS — R609 Edema, unspecified: Secondary | ICD-10-CM | POA: Diagnosis not present

## 2018-03-12 DIAGNOSIS — Z0000 Encounter for general adult medical examination without abnormal findings: Secondary | ICD-10-CM | POA: Diagnosis not present

## 2018-03-12 DIAGNOSIS — E1140 Type 2 diabetes mellitus with diabetic neuropathy, unspecified: Secondary | ICD-10-CM | POA: Diagnosis not present

## 2018-03-12 DIAGNOSIS — E119 Type 2 diabetes mellitus without complications: Secondary | ICD-10-CM | POA: Diagnosis not present

## 2018-03-18 DIAGNOSIS — M797 Fibromyalgia: Secondary | ICD-10-CM | POA: Diagnosis not present

## 2018-03-18 DIAGNOSIS — G4459 Other complicated headache syndrome: Secondary | ICD-10-CM | POA: Diagnosis not present

## 2018-04-02 DIAGNOSIS — F4310 Post-traumatic stress disorder, unspecified: Secondary | ICD-10-CM | POA: Diagnosis not present

## 2018-04-16 DIAGNOSIS — K581 Irritable bowel syndrome with constipation: Secondary | ICD-10-CM | POA: Diagnosis not present

## 2018-04-16 DIAGNOSIS — Z86010 Personal history of colonic polyps: Secondary | ICD-10-CM | POA: Diagnosis not present

## 2018-04-23 DIAGNOSIS — K219 Gastro-esophageal reflux disease without esophagitis: Secondary | ICD-10-CM | POA: Diagnosis not present

## 2018-04-23 DIAGNOSIS — I10 Essential (primary) hypertension: Secondary | ICD-10-CM | POA: Diagnosis not present

## 2018-04-23 DIAGNOSIS — Z720 Tobacco use: Secondary | ICD-10-CM | POA: Diagnosis not present

## 2018-04-23 DIAGNOSIS — F418 Other specified anxiety disorders: Secondary | ICD-10-CM | POA: Diagnosis not present

## 2018-04-23 DIAGNOSIS — G894 Chronic pain syndrome: Secondary | ICD-10-CM | POA: Diagnosis not present

## 2018-04-23 DIAGNOSIS — R609 Edema, unspecified: Secondary | ICD-10-CM | POA: Diagnosis not present

## 2018-04-23 DIAGNOSIS — Z79891 Long term (current) use of opiate analgesic: Secondary | ICD-10-CM | POA: Diagnosis not present

## 2018-04-23 DIAGNOSIS — R109 Unspecified abdominal pain: Secondary | ICD-10-CM | POA: Diagnosis not present

## 2018-04-23 DIAGNOSIS — M329 Systemic lupus erythematosus, unspecified: Secondary | ICD-10-CM | POA: Diagnosis not present

## 2018-04-23 DIAGNOSIS — Z5181 Encounter for therapeutic drug level monitoring: Secondary | ICD-10-CM | POA: Diagnosis not present

## 2018-04-23 DIAGNOSIS — E785 Hyperlipidemia, unspecified: Secondary | ICD-10-CM | POA: Diagnosis not present

## 2018-04-23 DIAGNOSIS — F4310 Post-traumatic stress disorder, unspecified: Secondary | ICD-10-CM | POA: Diagnosis not present

## 2018-04-23 DIAGNOSIS — E119 Type 2 diabetes mellitus without complications: Secondary | ICD-10-CM | POA: Diagnosis not present

## 2018-04-23 DIAGNOSIS — E1140 Type 2 diabetes mellitus with diabetic neuropathy, unspecified: Secondary | ICD-10-CM | POA: Diagnosis not present

## 2018-04-28 DIAGNOSIS — F310 Bipolar disorder, current episode hypomanic: Secondary | ICD-10-CM | POA: Diagnosis not present

## 2018-04-28 DIAGNOSIS — R42 Dizziness and giddiness: Secondary | ICD-10-CM | POA: Diagnosis not present

## 2018-04-28 DIAGNOSIS — I10 Essential (primary) hypertension: Secondary | ICD-10-CM | POA: Diagnosis not present

## 2018-04-28 DIAGNOSIS — F419 Anxiety disorder, unspecified: Secondary | ICD-10-CM | POA: Diagnosis not present

## 2018-04-30 DIAGNOSIS — F4310 Post-traumatic stress disorder, unspecified: Secondary | ICD-10-CM | POA: Diagnosis not present

## 2018-05-21 DIAGNOSIS — Z2821 Immunization not carried out because of patient refusal: Secondary | ICD-10-CM | POA: Diagnosis not present

## 2018-05-21 DIAGNOSIS — E119 Type 2 diabetes mellitus without complications: Secondary | ICD-10-CM | POA: Diagnosis not present

## 2018-05-21 DIAGNOSIS — Z720 Tobacco use: Secondary | ICD-10-CM | POA: Diagnosis not present

## 2018-05-21 DIAGNOSIS — E785 Hyperlipidemia, unspecified: Secondary | ICD-10-CM | POA: Diagnosis not present

## 2018-05-21 DIAGNOSIS — K219 Gastro-esophageal reflux disease without esophagitis: Secondary | ICD-10-CM | POA: Diagnosis not present

## 2018-05-21 DIAGNOSIS — E1140 Type 2 diabetes mellitus with diabetic neuropathy, unspecified: Secondary | ICD-10-CM | POA: Diagnosis not present

## 2018-05-21 DIAGNOSIS — I10 Essential (primary) hypertension: Secondary | ICD-10-CM | POA: Diagnosis not present

## 2018-05-22 DIAGNOSIS — M797 Fibromyalgia: Secondary | ICD-10-CM | POA: Diagnosis not present

## 2018-05-22 DIAGNOSIS — M359 Systemic involvement of connective tissue, unspecified: Secondary | ICD-10-CM | POA: Diagnosis not present

## 2018-05-22 DIAGNOSIS — Z79899 Other long term (current) drug therapy: Secondary | ICD-10-CM | POA: Diagnosis not present

## 2018-05-22 DIAGNOSIS — G894 Chronic pain syndrome: Secondary | ICD-10-CM | POA: Diagnosis not present

## 2018-05-22 DIAGNOSIS — E669 Obesity, unspecified: Secondary | ICD-10-CM | POA: Diagnosis not present

## 2018-06-04 DIAGNOSIS — F4310 Post-traumatic stress disorder, unspecified: Secondary | ICD-10-CM | POA: Diagnosis not present

## 2018-06-25 DIAGNOSIS — E119 Type 2 diabetes mellitus without complications: Secondary | ICD-10-CM | POA: Diagnosis not present

## 2018-06-25 DIAGNOSIS — E1140 Type 2 diabetes mellitus with diabetic neuropathy, unspecified: Secondary | ICD-10-CM | POA: Diagnosis not present

## 2018-06-25 DIAGNOSIS — K219 Gastro-esophageal reflux disease without esophagitis: Secondary | ICD-10-CM | POA: Diagnosis not present

## 2018-06-25 DIAGNOSIS — Z2821 Immunization not carried out because of patient refusal: Secondary | ICD-10-CM | POA: Diagnosis not present

## 2018-06-25 DIAGNOSIS — E785 Hyperlipidemia, unspecified: Secondary | ICD-10-CM | POA: Diagnosis not present

## 2018-06-25 DIAGNOSIS — I10 Essential (primary) hypertension: Secondary | ICD-10-CM | POA: Diagnosis not present

## 2018-06-25 DIAGNOSIS — Z720 Tobacco use: Secondary | ICD-10-CM | POA: Diagnosis not present

## 2018-07-03 DIAGNOSIS — F4310 Post-traumatic stress disorder, unspecified: Secondary | ICD-10-CM | POA: Diagnosis not present

## 2018-07-30 DIAGNOSIS — K219 Gastro-esophageal reflux disease without esophagitis: Secondary | ICD-10-CM | POA: Diagnosis not present

## 2018-07-30 DIAGNOSIS — I10 Essential (primary) hypertension: Secondary | ICD-10-CM | POA: Diagnosis not present

## 2018-07-30 DIAGNOSIS — E1140 Type 2 diabetes mellitus with diabetic neuropathy, unspecified: Secondary | ICD-10-CM | POA: Diagnosis not present

## 2018-07-30 DIAGNOSIS — E119 Type 2 diabetes mellitus without complications: Secondary | ICD-10-CM | POA: Diagnosis not present

## 2018-07-30 DIAGNOSIS — Z720 Tobacco use: Secondary | ICD-10-CM | POA: Diagnosis not present

## 2018-07-30 DIAGNOSIS — E785 Hyperlipidemia, unspecified: Secondary | ICD-10-CM | POA: Diagnosis not present

## 2018-09-03 DIAGNOSIS — E1140 Type 2 diabetes mellitus with diabetic neuropathy, unspecified: Secondary | ICD-10-CM | POA: Diagnosis not present

## 2018-09-03 DIAGNOSIS — Z720 Tobacco use: Secondary | ICD-10-CM | POA: Diagnosis not present

## 2018-09-03 DIAGNOSIS — E119 Type 2 diabetes mellitus without complications: Secondary | ICD-10-CM | POA: Diagnosis not present

## 2018-09-03 DIAGNOSIS — I10 Essential (primary) hypertension: Secondary | ICD-10-CM | POA: Diagnosis not present

## 2018-09-03 DIAGNOSIS — K219 Gastro-esophageal reflux disease without esophagitis: Secondary | ICD-10-CM | POA: Diagnosis not present

## 2018-09-03 DIAGNOSIS — E785 Hyperlipidemia, unspecified: Secondary | ICD-10-CM | POA: Diagnosis not present

## 2018-09-03 DIAGNOSIS — M549 Dorsalgia, unspecified: Secondary | ICD-10-CM | POA: Diagnosis not present

## 2018-09-29 DIAGNOSIS — M9904 Segmental and somatic dysfunction of sacral region: Secondary | ICD-10-CM | POA: Diagnosis not present

## 2018-09-29 DIAGNOSIS — M5136 Other intervertebral disc degeneration, lumbar region: Secondary | ICD-10-CM | POA: Diagnosis not present

## 2018-09-29 DIAGNOSIS — M9903 Segmental and somatic dysfunction of lumbar region: Secondary | ICD-10-CM | POA: Diagnosis not present

## 2018-09-29 DIAGNOSIS — M9905 Segmental and somatic dysfunction of pelvic region: Secondary | ICD-10-CM | POA: Diagnosis not present

## 2018-10-15 DIAGNOSIS — K219 Gastro-esophageal reflux disease without esophagitis: Secondary | ICD-10-CM | POA: Diagnosis not present

## 2018-10-15 DIAGNOSIS — E785 Hyperlipidemia, unspecified: Secondary | ICD-10-CM | POA: Diagnosis not present

## 2018-10-15 DIAGNOSIS — I10 Essential (primary) hypertension: Secondary | ICD-10-CM | POA: Diagnosis not present

## 2018-10-15 DIAGNOSIS — Z720 Tobacco use: Secondary | ICD-10-CM | POA: Diagnosis not present

## 2018-10-15 DIAGNOSIS — E1140 Type 2 diabetes mellitus with diabetic neuropathy, unspecified: Secondary | ICD-10-CM | POA: Diagnosis not present

## 2018-10-15 DIAGNOSIS — E119 Type 2 diabetes mellitus without complications: Secondary | ICD-10-CM | POA: Diagnosis not present

## 2018-10-15 DIAGNOSIS — M549 Dorsalgia, unspecified: Secondary | ICD-10-CM | POA: Diagnosis not present

## 2018-11-19 DIAGNOSIS — E669 Obesity, unspecified: Secondary | ICD-10-CM | POA: Diagnosis not present

## 2018-11-19 DIAGNOSIS — M797 Fibromyalgia: Secondary | ICD-10-CM | POA: Diagnosis not present

## 2018-11-19 DIAGNOSIS — Z79899 Other long term (current) drug therapy: Secondary | ICD-10-CM | POA: Diagnosis not present

## 2018-11-19 DIAGNOSIS — G894 Chronic pain syndrome: Secondary | ICD-10-CM | POA: Diagnosis not present

## 2018-11-19 DIAGNOSIS — M359 Systemic involvement of connective tissue, unspecified: Secondary | ICD-10-CM | POA: Diagnosis not present

## 2018-11-27 DIAGNOSIS — M359 Systemic involvement of connective tissue, unspecified: Secondary | ICD-10-CM | POA: Diagnosis not present

## 2018-12-19 DIAGNOSIS — E119 Type 2 diabetes mellitus without complications: Secondary | ICD-10-CM | POA: Diagnosis not present

## 2018-12-19 DIAGNOSIS — Z0001 Encounter for general adult medical examination with abnormal findings: Secondary | ICD-10-CM | POA: Diagnosis not present

## 2018-12-19 DIAGNOSIS — K219 Gastro-esophageal reflux disease without esophagitis: Secondary | ICD-10-CM | POA: Diagnosis not present

## 2018-12-19 DIAGNOSIS — I10 Essential (primary) hypertension: Secondary | ICD-10-CM | POA: Diagnosis not present

## 2018-12-19 DIAGNOSIS — Z131 Encounter for screening for diabetes mellitus: Secondary | ICD-10-CM | POA: Diagnosis not present

## 2018-12-19 DIAGNOSIS — M549 Dorsalgia, unspecified: Secondary | ICD-10-CM | POA: Diagnosis not present

## 2018-12-19 DIAGNOSIS — E785 Hyperlipidemia, unspecified: Secondary | ICD-10-CM | POA: Diagnosis not present

## 2018-12-19 DIAGNOSIS — E1140 Type 2 diabetes mellitus with diabetic neuropathy, unspecified: Secondary | ICD-10-CM | POA: Diagnosis not present

## 2018-12-19 DIAGNOSIS — Z720 Tobacco use: Secondary | ICD-10-CM | POA: Diagnosis not present

## 2019-01-05 DIAGNOSIS — G43909 Migraine, unspecified, not intractable, without status migrainosus: Secondary | ICD-10-CM | POA: Diagnosis not present

## 2019-01-05 DIAGNOSIS — H40053 Ocular hypertension, bilateral: Secondary | ICD-10-CM | POA: Diagnosis not present

## 2019-02-27 DIAGNOSIS — K219 Gastro-esophageal reflux disease without esophagitis: Secondary | ICD-10-CM | POA: Diagnosis not present

## 2019-02-27 DIAGNOSIS — M549 Dorsalgia, unspecified: Secondary | ICD-10-CM | POA: Diagnosis not present

## 2019-02-27 DIAGNOSIS — I10 Essential (primary) hypertension: Secondary | ICD-10-CM | POA: Diagnosis not present

## 2019-02-27 DIAGNOSIS — E119 Type 2 diabetes mellitus without complications: Secondary | ICD-10-CM | POA: Diagnosis not present

## 2019-02-27 DIAGNOSIS — R87612 Low grade squamous intraepithelial lesion on cytologic smear of cervix (LGSIL): Secondary | ICD-10-CM | POA: Diagnosis not present

## 2019-02-27 DIAGNOSIS — Z720 Tobacco use: Secondary | ICD-10-CM | POA: Diagnosis not present

## 2019-02-27 DIAGNOSIS — Z124 Encounter for screening for malignant neoplasm of cervix: Secondary | ICD-10-CM | POA: Diagnosis not present

## 2019-02-27 DIAGNOSIS — E785 Hyperlipidemia, unspecified: Secondary | ICD-10-CM | POA: Diagnosis not present

## 2019-02-27 DIAGNOSIS — E1140 Type 2 diabetes mellitus with diabetic neuropathy, unspecified: Secondary | ICD-10-CM | POA: Diagnosis not present

## 2019-03-13 DIAGNOSIS — I10 Essential (primary) hypertension: Secondary | ICD-10-CM | POA: Diagnosis not present

## 2019-03-13 DIAGNOSIS — E1140 Type 2 diabetes mellitus with diabetic neuropathy, unspecified: Secondary | ICD-10-CM | POA: Diagnosis not present

## 2019-03-13 DIAGNOSIS — R87612 Low grade squamous intraepithelial lesion on cytologic smear of cervix (LGSIL): Secondary | ICD-10-CM | POA: Diagnosis not present

## 2019-03-13 DIAGNOSIS — E785 Hyperlipidemia, unspecified: Secondary | ICD-10-CM | POA: Diagnosis not present

## 2019-03-13 DIAGNOSIS — K219 Gastro-esophageal reflux disease without esophagitis: Secondary | ICD-10-CM | POA: Diagnosis not present

## 2019-03-13 DIAGNOSIS — Z720 Tobacco use: Secondary | ICD-10-CM | POA: Diagnosis not present

## 2019-03-13 DIAGNOSIS — E119 Type 2 diabetes mellitus without complications: Secondary | ICD-10-CM | POA: Diagnosis not present

## 2019-03-13 DIAGNOSIS — M549 Dorsalgia, unspecified: Secondary | ICD-10-CM | POA: Diagnosis not present

## 2019-03-17 ENCOUNTER — Other Ambulatory Visit: Payer: Self-pay

## 2019-03-17 ENCOUNTER — Emergency Department (HOSPITAL_COMMUNITY)
Admission: EM | Admit: 2019-03-17 | Discharge: 2019-03-18 | Discharge status: Against Medical Advice | Payer: Medicare Other | Attending: Emergency Medicine | Admitting: Emergency Medicine

## 2019-03-17 DIAGNOSIS — Z5321 Procedure and treatment not carried out due to patient leaving prior to being seen by health care provider: Secondary | ICD-10-CM | POA: Insufficient documentation

## 2019-03-17 DIAGNOSIS — R200 Anesthesia of skin: Secondary | ICD-10-CM | POA: Diagnosis present

## 2019-03-17 NOTE — ED Triage Notes (Signed)
Per pt she has been having some numbness in her left arm that started today. Pt said no headache, no tingling no blurred vision just a spike in her blood pressure. Pt does have Lupus, rheumatoid arthritis, fibroid myalgia,Pt says that she has no chest pain, no sob

## 2019-03-18 DIAGNOSIS — R87612 Low grade squamous intraepithelial lesion on cytologic smear of cervix (LGSIL): Secondary | ICD-10-CM | POA: Diagnosis not present

## 2019-03-18 DIAGNOSIS — K219 Gastro-esophageal reflux disease without esophagitis: Secondary | ICD-10-CM | POA: Diagnosis not present

## 2019-03-18 DIAGNOSIS — I10 Essential (primary) hypertension: Secondary | ICD-10-CM | POA: Diagnosis not present

## 2019-03-18 DIAGNOSIS — Z5321 Procedure and treatment not carried out due to patient leaving prior to being seen by health care provider: Secondary | ICD-10-CM | POA: Diagnosis not present

## 2019-03-18 DIAGNOSIS — Z720 Tobacco use: Secondary | ICD-10-CM | POA: Diagnosis not present

## 2019-03-18 DIAGNOSIS — E1140 Type 2 diabetes mellitus with diabetic neuropathy, unspecified: Secondary | ICD-10-CM | POA: Diagnosis not present

## 2019-03-18 DIAGNOSIS — E119 Type 2 diabetes mellitus without complications: Secondary | ICD-10-CM | POA: Diagnosis not present

## 2019-03-18 DIAGNOSIS — E785 Hyperlipidemia, unspecified: Secondary | ICD-10-CM | POA: Diagnosis not present

## 2019-03-18 LAB — DIFFERENTIAL
Abs Immature Granulocytes: 0.01 10*3/uL (ref 0.00–0.07)
Basophils Absolute: 0 10*3/uL (ref 0.0–0.1)
Basophils Relative: 0 %
Eosinophils Absolute: 0.1 10*3/uL (ref 0.0–0.5)
Eosinophils Relative: 2 %
Immature Granulocytes: 0 %
Lymphocytes Relative: 59 %
Lymphs Abs: 4.2 10*3/uL — ABNORMAL HIGH (ref 0.7–4.0)
Monocytes Absolute: 0.6 10*3/uL (ref 0.1–1.0)
Monocytes Relative: 9 %
Neutro Abs: 2.1 10*3/uL (ref 1.7–7.7)
Neutrophils Relative %: 30 %

## 2019-03-18 LAB — CBC
HCT: 41.7 % (ref 36.0–46.0)
Hemoglobin: 13.4 g/dL (ref 12.0–15.0)
MCH: 27 pg (ref 26.0–34.0)
MCHC: 32.1 g/dL (ref 30.0–36.0)
MCV: 84.1 fL (ref 80.0–100.0)
Platelets: 255 10*3/uL (ref 150–400)
RBC: 4.96 MIL/uL (ref 3.87–5.11)
RDW: 14.6 % (ref 11.5–15.5)
WBC: 7.1 10*3/uL (ref 4.0–10.5)
nRBC: 0 % (ref 0.0–0.2)

## 2019-03-18 LAB — PROTIME-INR
INR: 1 (ref 0.8–1.2)
Prothrombin Time: 13.4 seconds (ref 11.4–15.2)

## 2019-03-18 LAB — I-STAT CHEM 8, ED
BUN: 10 mg/dL (ref 6–20)
Calcium, Ion: 1.21 mmol/L (ref 1.15–1.40)
Chloride: 103 mmol/L (ref 98–111)
Creatinine, Ser: 0.9 mg/dL (ref 0.44–1.00)
Glucose, Bld: 150 mg/dL — ABNORMAL HIGH (ref 70–99)
HCT: 43 % (ref 36.0–46.0)
Hemoglobin: 14.6 g/dL (ref 12.0–15.0)
Potassium: 3.4 mmol/L — ABNORMAL LOW (ref 3.5–5.1)
Sodium: 142 mmol/L (ref 135–145)
TCO2: 29 mmol/L (ref 22–32)

## 2019-03-18 LAB — COMPREHENSIVE METABOLIC PANEL
ALT: 27 U/L (ref 0–44)
AST: 26 U/L (ref 15–41)
Albumin: 4.1 g/dL (ref 3.5–5.0)
Alkaline Phosphatase: 84 U/L (ref 38–126)
Anion gap: 10 (ref 5–15)
BUN: 9 mg/dL (ref 6–20)
CO2: 26 mmol/L (ref 22–32)
Calcium: 9.1 mg/dL (ref 8.9–10.3)
Chloride: 102 mmol/L (ref 98–111)
Creatinine, Ser: 1 mg/dL (ref 0.44–1.00)
GFR calc Af Amer: >60 mL/min (ref >60)
GFR calc non Af Amer: >60 mL/min (ref >60)
Glucose, Bld: 157 mg/dL — ABNORMAL HIGH (ref 70–99)
Potassium: 3.6 mmol/L (ref 3.5–5.1)
Sodium: 138 mmol/L (ref 135–145)
Total Bilirubin: 0.7 mg/dL (ref 0.3–1.2)
Total Protein: 7.8 g/dL (ref 6.5–8.1)

## 2019-03-18 LAB — APTT: aPTT: 30 seconds (ref 24–36)

## 2019-03-18 NOTE — ED Notes (Signed)
Pt states she's feeling better and would like to leave because she's tired. Pulling pt. OTF.

## 2019-03-25 DIAGNOSIS — K219 Gastro-esophageal reflux disease without esophagitis: Secondary | ICD-10-CM | POA: Diagnosis not present

## 2019-03-25 DIAGNOSIS — E119 Type 2 diabetes mellitus without complications: Secondary | ICD-10-CM | POA: Diagnosis not present

## 2019-03-25 DIAGNOSIS — E785 Hyperlipidemia, unspecified: Secondary | ICD-10-CM | POA: Diagnosis not present

## 2019-03-25 DIAGNOSIS — I10 Essential (primary) hypertension: Secondary | ICD-10-CM | POA: Diagnosis not present

## 2019-03-25 DIAGNOSIS — E1140 Type 2 diabetes mellitus with diabetic neuropathy, unspecified: Secondary | ICD-10-CM | POA: Diagnosis not present

## 2019-03-25 DIAGNOSIS — R87612 Low grade squamous intraepithelial lesion on cytologic smear of cervix (LGSIL): Secondary | ICD-10-CM | POA: Diagnosis not present

## 2019-03-25 DIAGNOSIS — Z720 Tobacco use: Secondary | ICD-10-CM | POA: Diagnosis not present

## 2019-04-10 DIAGNOSIS — I10 Essential (primary) hypertension: Secondary | ICD-10-CM | POA: Diagnosis not present

## 2019-04-10 DIAGNOSIS — R0600 Dyspnea, unspecified: Secondary | ICD-10-CM | POA: Diagnosis not present

## 2019-04-15 DIAGNOSIS — B009 Herpesviral infection, unspecified: Secondary | ICD-10-CM | POA: Diagnosis not present

## 2019-04-15 DIAGNOSIS — R87612 Low grade squamous intraepithelial lesion on cytologic smear of cervix (LGSIL): Secondary | ICD-10-CM | POA: Diagnosis not present

## 2019-04-22 DIAGNOSIS — R87612 Low grade squamous intraepithelial lesion on cytologic smear of cervix (LGSIL): Secondary | ICD-10-CM | POA: Diagnosis not present

## 2019-04-22 DIAGNOSIS — E119 Type 2 diabetes mellitus without complications: Secondary | ICD-10-CM | POA: Diagnosis not present

## 2019-04-22 DIAGNOSIS — K219 Gastro-esophageal reflux disease without esophagitis: Secondary | ICD-10-CM | POA: Diagnosis not present

## 2019-04-22 DIAGNOSIS — R079 Chest pain, unspecified: Secondary | ICD-10-CM | POA: Diagnosis not present

## 2019-04-22 DIAGNOSIS — E1140 Type 2 diabetes mellitus with diabetic neuropathy, unspecified: Secondary | ICD-10-CM | POA: Diagnosis not present

## 2019-04-22 DIAGNOSIS — E785 Hyperlipidemia, unspecified: Secondary | ICD-10-CM | POA: Diagnosis not present

## 2019-04-22 DIAGNOSIS — I10 Essential (primary) hypertension: Secondary | ICD-10-CM | POA: Diagnosis not present

## 2019-04-22 DIAGNOSIS — Z720 Tobacco use: Secondary | ICD-10-CM | POA: Diagnosis not present

## 2019-05-01 DIAGNOSIS — M549 Dorsalgia, unspecified: Secondary | ICD-10-CM | POA: Diagnosis not present

## 2019-05-01 DIAGNOSIS — M546 Pain in thoracic spine: Secondary | ICD-10-CM | POA: Diagnosis not present

## 2019-05-01 DIAGNOSIS — S233XXA Sprain of ligaments of thoracic spine, initial encounter: Secondary | ICD-10-CM | POA: Diagnosis not present

## 2019-05-04 ENCOUNTER — Other Ambulatory Visit: Payer: Self-pay | Admitting: Obstetrics and Gynecology

## 2019-05-04 DIAGNOSIS — N870 Mild cervical dysplasia: Secondary | ICD-10-CM | POA: Diagnosis not present

## 2019-05-04 DIAGNOSIS — R87612 Low grade squamous intraepithelial lesion on cytologic smear of cervix (LGSIL): Secondary | ICD-10-CM | POA: Diagnosis not present

## 2019-05-04 DIAGNOSIS — Z3202 Encounter for pregnancy test, result negative: Secondary | ICD-10-CM | POA: Diagnosis not present

## 2019-07-31 ENCOUNTER — Ambulatory Visit: Payer: Medicare Other | Attending: Internal Medicine

## 2019-07-31 DIAGNOSIS — Z20822 Contact with and (suspected) exposure to covid-19: Secondary | ICD-10-CM

## 2019-08-01 LAB — NOVEL CORONAVIRUS, NAA: SARS-CoV-2, NAA: NOT DETECTED

## 2019-09-02 DIAGNOSIS — E785 Hyperlipidemia, unspecified: Secondary | ICD-10-CM | POA: Diagnosis not present

## 2019-09-02 DIAGNOSIS — E1140 Type 2 diabetes mellitus with diabetic neuropathy, unspecified: Secondary | ICD-10-CM | POA: Diagnosis not present

## 2019-09-02 DIAGNOSIS — Z720 Tobacco use: Secondary | ICD-10-CM | POA: Diagnosis not present

## 2019-09-02 DIAGNOSIS — K219 Gastro-esophageal reflux disease without esophagitis: Secondary | ICD-10-CM | POA: Diagnosis not present

## 2019-09-02 DIAGNOSIS — I10 Essential (primary) hypertension: Secondary | ICD-10-CM | POA: Diagnosis not present

## 2019-09-02 DIAGNOSIS — E119 Type 2 diabetes mellitus without complications: Secondary | ICD-10-CM | POA: Diagnosis not present

## 2019-09-02 DIAGNOSIS — F419 Anxiety disorder, unspecified: Secondary | ICD-10-CM | POA: Diagnosis not present

## 2019-11-16 DIAGNOSIS — I10 Essential (primary) hypertension: Secondary | ICD-10-CM | POA: Diagnosis not present

## 2019-11-16 DIAGNOSIS — E119 Type 2 diabetes mellitus without complications: Secondary | ICD-10-CM | POA: Diagnosis not present

## 2019-11-16 DIAGNOSIS — E785 Hyperlipidemia, unspecified: Secondary | ICD-10-CM | POA: Diagnosis not present

## 2019-11-16 DIAGNOSIS — Z720 Tobacco use: Secondary | ICD-10-CM | POA: Diagnosis not present

## 2019-11-16 DIAGNOSIS — H9312 Tinnitus, left ear: Secondary | ICD-10-CM | POA: Diagnosis not present

## 2019-11-16 DIAGNOSIS — E1140 Type 2 diabetes mellitus with diabetic neuropathy, unspecified: Secondary | ICD-10-CM | POA: Diagnosis not present

## 2019-11-16 DIAGNOSIS — K219 Gastro-esophageal reflux disease without esophagitis: Secondary | ICD-10-CM | POA: Diagnosis not present

## 2019-11-16 DIAGNOSIS — F419 Anxiety disorder, unspecified: Secondary | ICD-10-CM | POA: Diagnosis not present

## 2019-11-26 DIAGNOSIS — L659 Nonscarring hair loss, unspecified: Secondary | ICD-10-CM | POA: Diagnosis not present

## 2019-11-26 DIAGNOSIS — L218 Other seborrheic dermatitis: Secondary | ICD-10-CM | POA: Diagnosis not present

## 2019-12-23 DIAGNOSIS — F419 Anxiety disorder, unspecified: Secondary | ICD-10-CM | POA: Diagnosis not present

## 2020-01-04 DIAGNOSIS — G43909 Migraine, unspecified, not intractable, without status migrainosus: Secondary | ICD-10-CM | POA: Diagnosis not present

## 2020-01-04 DIAGNOSIS — E119 Type 2 diabetes mellitus without complications: Secondary | ICD-10-CM | POA: Diagnosis not present

## 2020-01-04 DIAGNOSIS — H40053 Ocular hypertension, bilateral: Secondary | ICD-10-CM | POA: Diagnosis not present

## 2020-01-04 DIAGNOSIS — Z79899 Other long term (current) drug therapy: Secondary | ICD-10-CM | POA: Diagnosis not present

## 2020-02-17 DIAGNOSIS — E119 Type 2 diabetes mellitus without complications: Secondary | ICD-10-CM | POA: Diagnosis not present

## 2020-02-17 DIAGNOSIS — E785 Hyperlipidemia, unspecified: Secondary | ICD-10-CM | POA: Diagnosis not present

## 2020-02-17 DIAGNOSIS — E1140 Type 2 diabetes mellitus with diabetic neuropathy, unspecified: Secondary | ICD-10-CM | POA: Diagnosis not present

## 2020-02-17 DIAGNOSIS — K219 Gastro-esophageal reflux disease without esophagitis: Secondary | ICD-10-CM | POA: Diagnosis not present

## 2020-02-17 DIAGNOSIS — Z720 Tobacco use: Secondary | ICD-10-CM | POA: Diagnosis not present

## 2020-02-17 DIAGNOSIS — F419 Anxiety disorder, unspecified: Secondary | ICD-10-CM | POA: Diagnosis not present

## 2020-02-17 DIAGNOSIS — I10 Essential (primary) hypertension: Secondary | ICD-10-CM | POA: Diagnosis not present

## 2020-03-18 DIAGNOSIS — K219 Gastro-esophageal reflux disease without esophagitis: Secondary | ICD-10-CM | POA: Diagnosis not present

## 2020-03-18 DIAGNOSIS — F419 Anxiety disorder, unspecified: Secondary | ICD-10-CM | POA: Diagnosis not present

## 2020-03-18 DIAGNOSIS — E119 Type 2 diabetes mellitus without complications: Secondary | ICD-10-CM | POA: Diagnosis not present

## 2020-03-18 DIAGNOSIS — Z720 Tobacco use: Secondary | ICD-10-CM | POA: Diagnosis not present

## 2020-03-18 DIAGNOSIS — R002 Palpitations: Secondary | ICD-10-CM | POA: Diagnosis not present

## 2020-03-18 DIAGNOSIS — I10 Essential (primary) hypertension: Secondary | ICD-10-CM | POA: Diagnosis not present

## 2020-03-18 DIAGNOSIS — B351 Tinea unguium: Secondary | ICD-10-CM | POA: Diagnosis not present

## 2020-03-18 DIAGNOSIS — E1140 Type 2 diabetes mellitus with diabetic neuropathy, unspecified: Secondary | ICD-10-CM | POA: Diagnosis not present

## 2020-03-18 DIAGNOSIS — E785 Hyperlipidemia, unspecified: Secondary | ICD-10-CM | POA: Diagnosis not present

## 2020-03-19 DIAGNOSIS — Z23 Encounter for immunization: Secondary | ICD-10-CM | POA: Diagnosis not present

## 2020-04-12 NOTE — Progress Notes (Signed)
Patient referred by Trey Sailors, PA for palpitations  Subjective:   Victoria Holmes, female    DOB: 11/04/1980, 39 y.o.   MRN: [id number]   Chief Complaint  Patient presents with  . Palpitations  . New Patient (Initial Visit)     HPI  39 y.o. African American female with hypertension, hyperlipidemia, type 2 DM, tobacco dependence, lupus, referred for evaluation palpitations  Patient has had couple episodes of palpitations in past month or so. Episodes last for few seconds at a time, are triggered by emotional stress, associated with lightheadedness and shortness of breath, no chest pain. She drinks regular sodas, although does not finish and says, "I waste more than I drink".   Physical activity is limited due to arthritis.   Past Medical History:  Diagnosis Date  . Anemia    no current med.  . Anxiety   . Chronic tonsillitis 03/2012  . Depression   . Diabetes mellitus    diet-controlled  . Fibrocystic breast disease   . Headache(784.0)    migraines  . History of endometriosis   . Hyperlipidemia   . Hypertension    under control, has been on med. x 1 yr.  . Lupus   . Neuritis   . Panic attacks   . Raynauds disease   . Rheumatoid arthritis(714.0)   . Schizoaffective disorder ([place])   . Seizures ([place])    last seizure > 1 yr. ago  . Sinusitis   . Sleep disturbance   . Thyroid nodule      Past Surgical History:  Procedure Laterality Date  . ABDOMINAL HYSTERECTOMY  6 yrs. ago   complete  . CHOLECYSTECTOMY  6-8 yrs. ago  . FOOT SURGERY  05/2011   right great toe  . TONSILLECTOMY  04/22/2012   Procedure: TONSILLECTOMY;  Surgeon: Rozetta Nunnery, MD;  Location: [place];  Service: ENT;  Laterality: N/A;     Social History   Tobacco Use  Smoking Status Current Every Day Smoker  . Years: 3.00  . Types: Cigarettes  Smokeless Tobacco Never Used  Tobacco Comment   3 cig./day    Social History   Substance and Sexual Activity    Alcohol Use Yes  . Alcohol/week: 0.0 standard drinks   Comment: occasionally     Family History  Problem Relation Age of Onset  . Diabetes Paternal Grandmother   . Hypertension Paternal Grandmother   . Kidney disease Paternal Grandmother   . Heart attack Maternal Grandmother        late 75's  . Arthritis Maternal Grandfather   . Cancer Maternal Grandfather        lung  . Lupus Unknown      Current Outpatient Medications on File Prior to Visit  Medication Sig Dispense Refill  . ALPRAZolam (XANAX) 1 MG tablet Take 1 mg by mouth 3 (three) times daily as needed for anxiety.     . diclofenac sodium (VOLTAREN) 1 % GEL Apply 2 g topically 4 (four) times daily as needed (for pain).    Marland Kitchen EPINEPHrine (EPIPEN JR) 0.15 MG/0.3ML injection Inject 0.15 mg into the muscle as needed.    Marland Kitchen FLUoxetine (PROZAC) 20 MG capsule Take 20 mg by mouth daily as needed (for mood).    . furosemide (LASIX) 20 MG tablet Take 20 mg by mouth daily.    Marland Kitchen ibuprofen (ADVIL,MOTRIN) 200 MG tablet Take 200 mg by mouth every 6 (six) hours as needed for fever, headache, mild  pain, moderate pain or cramping.    . lurasidone (LATUDA) 80 MG TABS tablet Take 80 mg by mouth daily with breakfast.    . prazosin (MINIPRESS) 5 MG capsule Take 5 mg by mouth at bedtime.    . pregabalin (LYRICA) 100 MG capsule Take 100 mg by mouth 2 (two) times daily.    . propranolol (INDERAL) 20 MG tablet Take 20 mg by mouth 2 (two) times daily.    Marland Kitchen tiZANidine (ZANAFLEX) 4 MG tablet Take 4 mg by mouth every 6 (six) hours as needed for muscle spasms.     No current facility-administered medications on file prior to visit.    Cardiovascular and other pertinent studies:   EKG 03/18/2020:  Sinus Bradycardia Nonspecific T-abnormality  Recent labs: 12/23/2019-03/18/2020: Glucose 137, BUN/Cr 8/0.79. EGFR 109. Na/K 144/4.9. Rest of the CMP normal HbA1C 6.1%  03/17/2019: Glucose 150, BUN/Cr 10/0.9. Na/K 142/3.4. Rest of the CMP normal H/H  13/41. MCV 84. Platelets 255   Review of Systems  Cardiovascular: Positive for palpitations. Negative for chest pain, dyspnea on exertion, leg swelling and syncope.         Vitals:   04/13/20 1050 04/13/20 1112  BP: (!) 143/100 (!) 151/97  Pulse: 80   Resp: 16   SpO2: 95%       Body mass index is 36.27 kg/m. Filed Weights   04/13/20 1050  Weight: 208 lb (94.3 kg)     Objective:   Physical Exam Vitals and nursing note reviewed.  Constitutional:      General: She is not in acute distress. Neck:     Vascular: No JVD.  Cardiovascular:     Rate and Rhythm: Normal rate and regular rhythm.     Heart sounds: Normal heart sounds. No murmur heard.   Pulmonary:     Effort: Pulmonary effort is normal.     Breath sounds: Normal breath sounds. No wheezing or rales.            Assessment & Recommendations:   39 y.o. African American female with hypertension, hyperlipidemia, type 2 DM, tobacco dependence, lupus, referred for evaluation palpitations  Palpitations: Most likely benign PAC/PVC. Recommend telemetry to rule out any other arrhythmia. Check echocardiogram. Okay to continue propranolol for now, which takes for panic attacks.   Hypertension: BP elevated today. Conitnue f/u w/PCP. Could increase amlodipine or add ACE/ARB.  I will see her on as needed basis, unless significant abnormalities found on above testing.   Thank you for referring the patient to [place]. Please feel free to contact with any questions.   Nigel Mormon, MD Pager: [phone] Office: [phone]

## 2020-04-13 ENCOUNTER — Ambulatory Visit: Payer: Medicare Other | Admitting: Cardiology

## 2020-04-13 ENCOUNTER — Other Ambulatory Visit: Payer: Self-pay

## 2020-04-13 ENCOUNTER — Encounter: Payer: Self-pay | Admitting: Cardiology

## 2020-04-13 ENCOUNTER — Ambulatory Visit: Payer: Medicare Other

## 2020-04-13 VITALS — BP 151/97 | HR 80 | Resp 16 | Ht 63.5 in | Wt 208.0 lb

## 2020-04-13 DIAGNOSIS — I10 Essential (primary) hypertension: Secondary | ICD-10-CM | POA: Diagnosis not present

## 2020-04-13 DIAGNOSIS — R002 Palpitations: Secondary | ICD-10-CM

## 2020-04-16 DIAGNOSIS — Z23 Encounter for immunization: Secondary | ICD-10-CM | POA: Diagnosis not present

## 2020-05-02 ENCOUNTER — Ambulatory Visit: Payer: Medicare Other

## 2020-05-02 ENCOUNTER — Other Ambulatory Visit: Payer: Self-pay

## 2020-05-02 DIAGNOSIS — I10 Essential (primary) hypertension: Secondary | ICD-10-CM | POA: Diagnosis not present

## 2020-05-02 DIAGNOSIS — R002 Palpitations: Secondary | ICD-10-CM | POA: Diagnosis not present

## 2020-05-07 DIAGNOSIS — R002 Palpitations: Secondary | ICD-10-CM | POA: Diagnosis not present

## 2020-05-10 DIAGNOSIS — R002 Palpitations: Secondary | ICD-10-CM | POA: Diagnosis not present

## 2020-05-12 ENCOUNTER — Ambulatory Visit: Payer: Medicare Other | Admitting: Cardiology

## 2020-05-18 ENCOUNTER — Encounter: Payer: Self-pay | Admitting: Cardiology

## 2020-05-18 ENCOUNTER — Other Ambulatory Visit: Payer: Self-pay

## 2020-05-18 ENCOUNTER — Ambulatory Visit: Payer: Medicare Other | Admitting: Cardiology

## 2020-05-18 VITALS — BP 146/96 | HR 72 | Resp 16 | Ht 63.0 in | Wt 212.0 lb

## 2020-05-18 DIAGNOSIS — I10 Essential (primary) hypertension: Secondary | ICD-10-CM | POA: Diagnosis not present

## 2020-05-18 DIAGNOSIS — R072 Precordial pain: Secondary | ICD-10-CM | POA: Diagnosis not present

## 2020-05-18 DIAGNOSIS — R002 Palpitations: Secondary | ICD-10-CM

## 2020-05-18 MED ORDER — AMLODIPINE BESYLATE 5 MG PO TABS: 2.5000 mg | ORAL_TABLET | Freq: Every day | ORAL | 1 refills | Status: DC

## 2020-05-18 MED ORDER — AMLODIPINE BESYLATE 5 MG PO TABS: 5.0000 mg | ORAL_TABLET | Freq: Every day | ORAL | 1 refills | Status: DC

## 2020-05-18 NOTE — Progress Notes (Signed)
Patient referred by Trey Sailors, PA for palpitations  Subjective:   Victoria Holmes, female    DOB: October 31, 1980, 39 y.o.   MRN: [id number]   Chief Complaint  Patient presents with  . Palpitations  . Follow-up  . Results    monitor echo     HPI  39 y.o. African American female with hypertension, hyperlipidemia, type 2 DM, tobacco dependence, lupus, referred for evaluation palpitations  Echocardiogram and monitor results reviewed with the patient, details below. Patient has episodes of, what she now describes as, pain and flutter sensation.   Initial consultation 03/2020: Patient has had couple episodes of palpitations in past month or so. Episodes last for few seconds at a time, are triggered by emotional stress, associated with lightheadedness and shortness of breath, no chest pain. She drinks regular sodas, although does not finish and says, "I waste more than I drink".   Physical activity is limited due to arthritis.    Current Outpatient Medications on File Prior to Visit  Medication Sig Dispense Refill  . amLODipine (NORVASC) 2.5 MG tablet Take 2.5 mg by mouth daily.    . diclofenac sodium (VOLTAREN) 1 % GEL Apply 2 g topically 4 (four) times daily as needed (for pain).    Marland Kitchen EPINEPHrine (EPIPEN JR) 0.15 MG/0.3ML injection Inject 0.15 mg into the muscle as needed.    Marland Kitchen ibuprofen (ADVIL,MOTRIN) 200 MG tablet Take 200 mg by mouth every 6 (six) hours as needed for fever, headache, mild pain, moderate pain or cramping.    . pregabalin (LYRICA) 100 MG capsule Take 100 mg by mouth 2 (two) times daily.    . propranolol (INDERAL) 20 MG tablet Take 20 mg by mouth 2 (two) times daily.    Marland Kitchen tiZANidine (ZANAFLEX) 4 MG tablet Take 4 mg by mouth every 6 (six) hours as needed for muscle spasms.     No current facility-administered medications on file prior to visit.    Cardiovascular and other pertinent studies:  EKG 05/18/2020: Sinus rhythm 81 bpm Nonspecific  T-abnormality  Echocardiogram 05/02/2020:  Left ventricle cavity is normal in size and wall thickness. Normal global  wall motion. Normal LV systolic function with visual EF 50-55%. Normal  diastolic filling pattern. No significant valvular abnormality.  No evidence of pulmonary hypertension.  Mobile cardiac telemetry 14  days 04/13/2020 - 04/27/2020: Dominant rhythm: Sinus. HR 47-151 bpm. Avg HR 66 bpm. <1% SVE, VE (PAV/PVC, couplets) No atrial fibrillation/atrial flutter/SVT/VT/high grade AV block, sinus pause >3sec noted. 28 patient triggered events, correalte with sinus rhythm.    EKG 03/18/2020:  Sinus Bradycardia Nonspecific T-abnormality  Recent labs: 12/23/2019-03/18/2020: Glucose 137, BUN/Cr 8/0.79. EGFR 109. Na/K 144/4.9. Rest of the CMP normal HbA1C 6.1%  03/17/2019: Glucose 150, BUN/Cr 10/0.9. Na/K 142/3.4. Rest of the CMP normal H/H 13/41. MCV 84. Platelets 255   Review of Systems  Cardiovascular: Positive for palpitations. Negative for chest pain, dyspnea on exertion, leg swelling and syncope.         Vitals:   05/18/20 1344  BP: (!) 146/96  Pulse: 72  Resp: 16  SpO2: 95%     Body mass index is 37.55 kg/m. Filed Weights   05/18/20 1344  Weight: 212 lb (96.2 kg)     Objective:   Physical Exam Vitals and nursing note reviewed.  Constitutional:      General: She is not in acute distress. Neck:     Vascular: No JVD.  Cardiovascular:     Rate and Rhythm:  Normal rate and regular rhythm.     Heart sounds: Normal heart sounds. No murmur heard.   Pulmonary:     Effort: Pulmonary effort is normal.     Breath sounds: Normal breath sounds. No wheezing or rales.            Assessment & Recommendations:   39 y.o. African American female with hypertension, hyperlipidemia, type 2 DM, tobacco dependence, lupus, referred for evaluation palpitations  Palpitations: Structurally normal heart. No significant arrhthymias noted. Symptoms correlated  with sinus rhythm.   Hypertension: BP elevated today. Increase amlodipine to 5 mg.  Atypical chest pain: Recommend exercise treadmill stress testing and calcium score scan.    Nigel Mormon, MD Pager: [phone] Office: [phone]

## 2020-05-21 ENCOUNTER — Other Ambulatory Visit (HOSPITAL_COMMUNITY)
Admission: RE | Admit: 2020-05-21 | Discharge: 2020-05-21 | Disposition: A | Discharge status: Home / Self Care | Payer: Medicare Other | Source: Ambulatory Visit | Attending: Cardiology | Admitting: Cardiology

## 2020-05-21 DIAGNOSIS — Z20822 Contact with and (suspected) exposure to covid-19: Secondary | ICD-10-CM | POA: Insufficient documentation

## 2020-05-21 DIAGNOSIS — Z01812 Encounter for preprocedural laboratory examination: Secondary | ICD-10-CM | POA: Diagnosis not present

## 2020-05-22 LAB — SARS CORONAVIRUS 2 (TAT 6-24 HRS): SARS Coronavirus 2: NEGATIVE

## 2020-05-25 ENCOUNTER — Other Ambulatory Visit: Payer: Self-pay

## 2020-05-25 ENCOUNTER — Ambulatory Visit: Payer: Medicare Other

## 2020-05-25 DIAGNOSIS — R072 Precordial pain: Secondary | ICD-10-CM

## 2020-05-27 NOTE — Progress Notes (Signed)
Called and spoke with patient regarding her stress test results.

## 2020-06-06 ENCOUNTER — Encounter (HOSPITAL_COMMUNITY): Payer: Self-pay

## 2020-06-06 ENCOUNTER — Emergency Department (HOSPITAL_COMMUNITY)
Admission: EM | Admit: 2020-06-06 | Discharge: 2020-06-07 | Disposition: A | Discharge status: Home / Self Care | Payer: Medicare Other | Attending: Emergency Medicine | Admitting: Emergency Medicine

## 2020-06-06 DIAGNOSIS — R5383 Other fatigue: Secondary | ICD-10-CM

## 2020-06-06 DIAGNOSIS — F419 Anxiety disorder, unspecified: Secondary | ICD-10-CM | POA: Diagnosis not present

## 2020-06-06 DIAGNOSIS — R3589 Other polyuria: Secondary | ICD-10-CM

## 2020-06-06 DIAGNOSIS — E860 Dehydration: Secondary | ICD-10-CM | POA: Diagnosis not present

## 2020-06-06 DIAGNOSIS — R739 Hyperglycemia, unspecified: Secondary | ICD-10-CM

## 2020-06-06 DIAGNOSIS — Z23 Encounter for immunization: Secondary | ICD-10-CM | POA: Diagnosis not present

## 2020-06-06 DIAGNOSIS — I10 Essential (primary) hypertension: Secondary | ICD-10-CM | POA: Insufficient documentation

## 2020-06-06 DIAGNOSIS — Z87891 Personal history of nicotine dependence: Secondary | ICD-10-CM | POA: Diagnosis not present

## 2020-06-06 DIAGNOSIS — Z91040 Latex allergy status: Secondary | ICD-10-CM | POA: Diagnosis not present

## 2020-06-06 DIAGNOSIS — K219 Gastro-esophageal reflux disease without esophagitis: Secondary | ICD-10-CM | POA: Diagnosis not present

## 2020-06-06 DIAGNOSIS — Z79899 Other long term (current) drug therapy: Secondary | ICD-10-CM | POA: Insufficient documentation

## 2020-06-06 DIAGNOSIS — E785 Hyperlipidemia, unspecified: Secondary | ICD-10-CM | POA: Diagnosis not present

## 2020-06-06 DIAGNOSIS — E1165 Type 2 diabetes mellitus with hyperglycemia: Secondary | ICD-10-CM | POA: Diagnosis not present

## 2020-06-06 DIAGNOSIS — Z720 Tobacco use: Secondary | ICD-10-CM | POA: Diagnosis not present

## 2020-06-06 DIAGNOSIS — E119 Type 2 diabetes mellitus without complications: Secondary | ICD-10-CM | POA: Diagnosis not present

## 2020-06-06 DIAGNOSIS — E1140 Type 2 diabetes mellitus with diabetic neuropathy, unspecified: Secondary | ICD-10-CM | POA: Diagnosis not present

## 2020-06-06 LAB — CBC
HCT: 44.3 % (ref 36.0–46.0)
Hemoglobin: 14.2 g/dL (ref 12.0–15.0)
MCH: 26.1 pg (ref 26.0–34.0)
MCHC: 32.1 g/dL (ref 30.0–36.0)
MCV: 81.3 fL (ref 80.0–100.0)
Platelets: 237 K/uL (ref 150–400)
RBC: 5.45 MIL/uL — ABNORMAL HIGH (ref 3.87–5.11)
RDW: 12.8 % (ref 11.5–15.5)
WBC: 6.4 K/uL (ref 4.0–10.5)
nRBC: 0 % (ref 0.0–0.2)

## 2020-06-06 LAB — I-STAT BETA HCG BLOOD, ED (MC, WL, AP ONLY): I-stat hCG, quantitative: <5 m[IU]/mL (ref <5)

## 2020-06-06 LAB — HEPATIC FUNCTION PANEL
ALT: 57 U/L — ABNORMAL HIGH (ref 0–44)
AST: 41 U/L (ref 15–41)
Albumin: 4.5 g/dL (ref 3.5–5.0)
Alkaline Phosphatase: 99 U/L (ref 38–126)
Bilirubin, Direct: 0.2 mg/dL (ref 0.0–0.2)
Indirect Bilirubin: 0.7 mg/dL (ref 0.3–0.9)
Total Bilirubin: 0.9 mg/dL (ref 0.3–1.2)
Total Protein: 8.5 g/dL — ABNORMAL HIGH (ref 6.5–8.1)

## 2020-06-06 LAB — URINALYSIS, ROUTINE W REFLEX MICROSCOPIC
Bacteria, UA: NONE SEEN
Bilirubin Urine: NEGATIVE
Glucose, UA: >=500 mg/dL — AB
Ketones, ur: 20 mg/dL — AB
Leukocytes,Ua: NEGATIVE
Nitrite: NEGATIVE
Protein, ur: 30 mg/dL — AB
Specific Gravity, Urine: 1.023 (ref 1.005–1.030)
pH: 5 (ref 5.0–8.0)

## 2020-06-06 LAB — BASIC METABOLIC PANEL
Anion gap: 10 (ref 5–15)
BUN: 10 mg/dL (ref 6–20)
CO2: 28 mmol/L (ref 22–32)
Calcium: 9.6 mg/dL (ref 8.9–10.3)
Chloride: 99 mmol/L (ref 98–111)
Creatinine, Ser: 0.77 mg/dL (ref 0.44–1.00)
GFR, Estimated: >60 mL/min (ref >60)
Glucose, Bld: 394 mg/dL — ABNORMAL HIGH (ref 70–99)
Potassium: 4.7 mmol/L (ref 3.5–5.1)
Sodium: 137 mmol/L (ref 135–145)

## 2020-06-06 LAB — LIPASE, BLOOD: Lipase: 27 U/L (ref 11–51)

## 2020-06-06 LAB — CBG MONITORING, ED
Glucose-Capillary: 400 mg/dL — ABNORMAL HIGH (ref 70–99)
Glucose-Capillary: 336 mg/dL — ABNORMAL HIGH (ref 70–99)
Glucose-Capillary: 303 mg/dL — ABNORMAL HIGH (ref 70–99)
Glucose-Capillary: 292 mg/dL — ABNORMAL HIGH (ref 70–99)
Glucose-Capillary: 239 mg/dL — ABNORMAL HIGH (ref 70–99)
Glucose-Capillary: 269 mg/dL — ABNORMAL HIGH (ref 70–99)

## 2020-06-06 MED ORDER — TETANUS-DIPHTH-ACELL PERTUSSIS 5-2.5-18.5 LF-MCG/0.5 IM SUSY
0.5000 mL | PREFILLED_SYRINGE | Freq: Once | INTRAMUSCULAR | Status: AC
  Administered 2020-06-06: 0.5 mL via INTRAMUSCULAR
  Filled 2020-06-06: qty 0.5

## 2020-06-06 MED ORDER — SODIUM CHLORIDE 0.9 % IV BOLUS
1000.0000 mL | Freq: Once | INTRAVENOUS | Status: AC
  Administered 2020-06-06: 1000 mL via INTRAVENOUS

## 2020-06-06 MED ORDER — METFORMIN HCL 850 MG PO TABS: 850.0000 mg | ORAL_TABLET | Freq: Every day | ORAL | 0 refills | Status: DC

## 2020-06-06 NOTE — ED Triage Notes (Addendum)
Pt reports being sent by primary doctor due to blood sugar of 428. Pt reports blurry vision. Pt reports ocasional stomach cramps, pt believes it is associated with her anxiety. Pt has hx of type 2 diabetes, reports not taking any medication for it.

## 2020-06-06 NOTE — ED Provider Notes (Signed)
[facility] EMERGENCY DEPARTMENT Provider Note   CSN: [id number] Arrival date & time: 06/06/20  1115     History Chief Complaint  Patient presents with  . Hyperglycemia    Victoria Holmes is a 39 y.o. female.  The history is provided by the patient and medical records. No language interpreter was used.  Hyperglycemia Blood sugar level PTA:  400s Severity:  Moderate Onset quality:  Gradual Timing:  Constant Progression:  Waxing and waning Chronicity:  New Diabetes status:  Controlled with diet Relieved by:  Nothing Ineffective treatments:  None tried Associated symptoms: blurred vision (intermittnet), dehydration, fatigue, increased thirst and polyuria   Associated symptoms: no abdominal pain, no chest pain, no confusion, no diaphoresis, no dizziness, no dysuria, no fever, no malaise, no nausea, no shortness of breath, no vomiting and no weakness   Risk factors: no hx of DKA        Past Medical History:  Diagnosis Date  . Anemia    no current med.  . Anxiety   . Chronic tonsillitis 03/2012  . Depression   . Diabetes mellitus    diet-controlled  . Fibrocystic breast disease   . Headache(784.0)    migraines  . History of endometriosis   . Hyperlipidemia   . Hypertension    under control, has been on med. x 1 yr.  . Lupus ([place])   . Lupus ([place])   . Neuritis   . Panic attacks   . Raynauds disease   . Rheumatoid arthritis(714.0)   . Schizoaffective disorder ([place])   . Seizures ([place])    last seizure > 1 yr. ago  . Sinusitis   . Sleep disturbance   . Thyroid nodule     Patient Active Problem List   Diagnosis Date Noted  . Precordial pain 05/18/2020  . Essential hypertension 04/13/2020  . Palpitations 04/13/2020  . Carpopedal spasm 02/01/2015  . Epilepsy ([place]) 11/24/2014  . Tobacco use disorder 07/17/2014  . Drug allergy, multiple 07/17/2014    Past Surgical History:  Procedure Laterality Date  . ABDOMINAL HYSTERECTOMY  6 yrs. ago    complete  . CHOLECYSTECTOMY  6-8 yrs. ago  . FOOT SURGERY  05/2011   right great toe  . TONSILLECTOMY  04/22/2012   Procedure: TONSILLECTOMY;  Surgeon: Rozetta Nunnery, MD;  Location: [facility];  Service: ENT;  Laterality: N/A;     OB History   No obstetric history on file.     Family History  Problem Relation Age of Onset  . Diabetes Paternal Grandmother   . Hypertension Paternal Grandmother   . Kidney disease Paternal Grandmother   . Heart attack Maternal Grandmother        late 26's  . Arthritis Maternal Grandfather   . Cancer Maternal Grandfather        lung  . Lupus Other     Social History   Tobacco Use  . Smoking status: Former Smoker    Packs/day: 0.25    Years: 3.00    Pack years: 0.75    Types: Cigarettes    Quit date: 01/2019    Years since quitting: 1.3  . Smokeless tobacco: Never Used  . Tobacco comment: 3 cig./day  Vaping Use  . Vaping Use: Never used  Substance Use Topics  . Alcohol use: Yes    Alcohol/week: 0.0 standard drinks    Comment: occasionally  . Drug use: Yes    Types: Marijuana    Home Medications Prior  to Admission medications   Medication Sig Start Date End Date Taking? Authorizing Provider  amLODipine (NORVASC) 5 MG tablet Take 1 tablet (5 mg total) by mouth daily. 05/18/20   Patwardhan, Reynold Bowen, MD  diclofenac sodium (VOLTAREN) 1 % GEL Apply 2 g topically 4 (four) times daily as needed (for pain).    [provider]  EPINEPHrine (EPIPEN JR) 0.15 MG/0.3ML injection Inject 0.15 mg into the muscle as needed.    [provider]  ibuprofen (ADVIL,MOTRIN) 200 MG tablet Take 200 mg by mouth every 6 (six) hours as needed for fever, headache, mild pain, moderate pain or cramping.    [provider]  pregabalin (LYRICA) 100 MG capsule Take 100 mg by mouth 2 (two) times daily.    [provider]  propranolol (INDERAL) 20 MG tablet Take 20 mg by mouth 2 (two) times daily.    [provider]  tiZANidine (ZANAFLEX) 4 MG tablet Take 4 mg by mouth every 6 (six) hours as needed for muscle spasms.    [provider]  valACYclovir (VALTREX) 1000 MG tablet Take 1,000 mg by mouth daily. 05/03/20   [provider]    Allergies    Amoxicillin, Azithromycin, Darvocet [propoxyphene n-acetaminophen], Grapeseed extract [nutritional supplements], Kiwi extract, Latex, Macrolides and ketolides, Morphine and related, Sulfa antibiotics, Klonopin [clonazepam], Penicillins, Zoloft [sertraline hcl], Contrast media [iodinated diagnostic agents], Flagyl [metronidazole], Adhesive [tape], Concerta [methylphenidate], Lexapro [escitalopram oxalate], and Lortab [hydrocodone-acetaminophen]  Review of Systems   Review of Systems  Constitutional: Positive for fatigue. Negative for chills, diaphoresis and fever.  HENT: Negative for congestion.   Eyes: Positive for blurred vision (intermittnet) and visual disturbance (improved now). Negative for photophobia, pain and redness.  Respiratory: Negative for cough, choking, chest tightness, shortness of breath and wheezing.   Cardiovascular: Negative for chest pain.  Gastrointestinal: Negative for abdominal pain, constipation, diarrhea, nausea and vomiting.  Endocrine: Positive for polydipsia and polyuria.  Genitourinary: Positive for frequency. Negative for dysuria.  Musculoskeletal: Negative for back pain, neck pain and neck stiffness.  Skin: Negative for rash and wound.  Neurological: Negative for dizziness, syncope, speech difficulty, weakness, light-headedness and headaches.  Psychiatric/Behavioral: Negative for agitation and confusion.  All other systems reviewed and are negative.   Physical Exam Updated Vital Signs BP (!) 158/111 (BP Location: Right Arm)   Pulse 86   Temp 98.1 F (36.7 C) (Oral)   Resp 16   Ht 5\' 3"  (1.6 m)   Wt 94.8 kg   SpO2 100%   BMI 37.02 kg/m   Physical Exam Vitals and nursing note reviewed.    Constitutional:      General: She is not in acute distress.    Appearance: She is well-developed. She is not ill-appearing, toxic-appearing or diaphoretic.  HENT:     Head: Normocephalic and atraumatic.     Right Ear: External ear normal.     Left Ear: External ear normal.     Nose: Nose normal. No congestion or rhinorrhea.     Mouth/Throat:     Mouth: Mucous membranes are dry.     Pharynx: No oropharyngeal exudate or posterior oropharyngeal erythema.  Eyes:     Extraocular Movements: Extraocular movements intact.     Conjunctiva/sclera: Conjunctivae normal.     Pupils: Pupils are equal, round, and reactive to light.  Cardiovascular:     Rate and Rhythm: Normal rate.     Pulses: Normal pulses.     Heart sounds: No murmur heard.  Pulmonary:     Effort: No respiratory distress.     Breath sounds: No stridor. No wheezing, rhonchi or rales.  Chest:     Chest wall: No tenderness.  Abdominal:     General: Abdomen is flat. There is no distension.     Tenderness: There is no abdominal tenderness. There is no right CVA tenderness, left CVA tenderness or rebound.  Musculoskeletal:        General: No tenderness.     Cervical back: Normal range of motion and neck supple. No tenderness.     Right lower leg: No edema.     Left lower leg: No edema.  Skin:    General: Skin is warm.     Capillary Refill: Capillary refill takes less than 2 seconds.     Findings: No erythema or rash.  Neurological:     General: No focal deficit present.     Mental Status: She is alert and oriented to person, place, and time.     Cranial Nerves: No cranial nerve deficit.     Sensory: No sensory deficit.     Motor: No weakness or abnormal muscle tone.     Coordination: Coordination normal.     Deep Tendon Reflexes: Reflexes are normal and symmetric.  Psychiatric:        Mood and Affect: Mood normal.     ED Results / Procedures / Treatments   Labs (all labs ordered are listed, but only abnormal  results are displayed) Labs Reviewed  BASIC METABOLIC PANEL - Abnormal; Notable for the following components:      Result Value   Glucose, Bld 394 (*)    All other components within normal limits  CBC - Abnormal; Notable for the following components:   RBC 5.45 (*)    All other components within normal limits  URINALYSIS, ROUTINE W REFLEX MICROSCOPIC - Abnormal; Notable for the following components:   Glucose, UA >=500 (*)    Hgb urine dipstick SMALL (*)    Ketones, ur 20 (*)    Protein, ur 30 (*)    All other components within normal limits  HEPATIC FUNCTION PANEL - Abnormal; Notable for the following components:   Total Protein 8.5 (*)    ALT 57 (*)    All other components within normal limits  CBG MONITORING, ED - Abnormal; Notable for the following components:   Glucose-Capillary 400 (*)    All other components within normal limits  CBG MONITORING, ED - Abnormal; Notable for the following components:   Glucose-Capillary 336 (*)    All other components within normal limits  CBG MONITORING, ED - Abnormal; Notable for the following components:   Glucose-Capillary 303 (*)    All other components within normal limits  CBG MONITORING, ED - Abnormal; Notable for the following components:   Glucose-Capillary 292 (*)    All other components within normal limits  CBG MONITORING, ED - Abnormal; Notable for the following components:   Glucose-Capillary 239 (*)    All other components within normal limits  URINE CULTURE  URINE CULTURE  LIPASE, BLOOD  I-STAT BETA HCG BLOOD, ED ([facility], [facility], [facility] ONLY)  CBG MONITORING, ED    EKG None  Radiology No results found.  Procedures Procedures (including critical care time)  Medications Ordered in ED Medications  sodium chloride 0.9 % bolus 1,000 mL (0 mLs Intravenous Stopped 06/06/20 2329)  Tdap (BOOSTRIX) injection 0.5 mL (0.5 mLs Intramuscular Given 06/06/20 2328)    ED Course  I have reviewed the triage vital signs and the nursing  notes.  Pertinent labs & imaging results that were available during my care of the patient were reviewed by me and considered in my medical decision making (see chart for details).    MDM Rules/Calculators/A&P                          Luvada Salamone is a 39 y.o. female with a past medical history significant for rheumatoid arthritis, lupus, Raynaud's, seizures, endometriosis, hypertension, hyperlipidemia, anxiety, depression, and diabetes currently managed with diet and exercise who presents with hyperglycemia, urinary frequency, and some slightly worsened blurry vision.  Patient reports that for the last few days, she has had blurry vision in both her eyes as well as feeling more dehydrated and with increased thirst.  She reports has been urinating more and having some frequency.  She reports that she has had frequent UTIs in the past.  She says that she went see her PCP for a check of the day and was found to have glucose in the 400s.  When I found this and she reports she had some mild blurry vision, they told her to come to emergency emergency department for evaluation.  Patient reports no current headache or eye pain.  She reports her blurry vision comes and goes and is mild.  She denies any neurologic deficit otherwise with no numbness, tingling, weakness.  She denies nausea, vomiting, constipation, or diarrhea.  She does report the urinary frequency is similar to when she had UTIs in the past.  She says that she has never needed to be on any glucose medications.  She does report she had increase in stress and thinks that could be contributing as well.  She denies any current abdominal pain or chest pain.  No back pain.  No other complaints.  On exam, lungs are clear and chest is nontender.  Abdomen is nontender.  Normal bowel sounds.  No focal neurologic deficits on my exam with normal extraocular movements, visual fields, and pupil exam.  Patient resting comfortably.  Patient waited for  approximately 9 and half hours prior to my initial evaluation in the emergency department.  Patient had some labs performed throughout her ED stay in the waiting room showing some hyperglycemia but no evidence of DKA.  Patient had not yet had a urinalysis sent.  After discussion with the patient, we do want to give her some fluids due to the hyperglycemia and evidence of dry mucous membranes on exam.  We will recheck her glucose after.  We will also get a lipase and hepatic function panel assessed and check urinalysis given the frequency.  If work-up is reassuring, and her glucoses started to improve, dissipate discharge to follow-up with her PCP to discuss further glucose medications.  If it is elevated, will consider initiation of Metformin and then following up with her PCP as well.  Patient agrees with plan of care, anticipate reassessment after work.  Patient's glucose remained in the 200s.  Her lipase was nonelevated and hepatic function was not significant elevated.  Urinalysis does not show infection.  Patient will be p.o. challenged and then have a CBG checked.  Anticipate will start her on Metformin and have her follow-up with her PCP to discuss further management.  Patient reports she is feeling better in regards to the waxing waning blurry vision abnormalities and fatigue after the fluids.  She is feeling better hydrated.  Anticipate  discharge after p.o. challenge. She also reports her tetanus is out of date and she would like it updated. This was ordered for her.  11:45 PM Patient's glucose rose appropriately after some food but did not go above 300. Patient is feeling much better after fluids. She denies any other complaints.  We will start Metformin and have her follow-up with her PCP this week. She agrees with plan of care and follow-up instructions. She no questions or concerns and was discharged in good condition.   Final Clinical Impression(s) / ED Diagnoses Final diagnoses:   Hyperglycemia  Polyuria  Fatigue, unspecified type  Dehydration    Rx / DC Orders ED Discharge Orders         Ordered    metFORMIN (GLUCOPHAGE) 850 MG tablet  Daily with breakfast        06/06/20 2306         Clinical Impression: 1. Hyperglycemia   2. Polyuria   3. Fatigue, unspecified type   4. Dehydration     Disposition: Discharge  Condition: Good  I have discussed the results, Dx and Tx plan with the pt(& family if present). He/she/they expressed understanding and agree(s) with the plan. Discharge instructions discussed at great length. Strict return precautions discussed and pt &/or family have verbalized understanding of the instructions. No further questions at time of discharge.    New Prescriptions   METFORMIN (GLUCOPHAGE) 850 MG TABLET    Take 1 tablet (850 mg total) by mouth daily with breakfast.    Follow Up: Trey Sailors, PA [address] [address] [phone]     [facility] EMERGENCY DEPARTMENT [address] [id number] mc [place]       Fatumata Kashani, Gwenyth Allegra, MD 06/06/20 [phone]

## 2020-06-06 NOTE — ED Notes (Signed)
Pt requesting tdap vaccine states she is due.

## 2020-06-06 NOTE — Discharge Instructions (Signed)
Your work-up today did show elevated glucose numbers today although your other work-up is reassuring.  There is no evidence of UTI but a culture was sent as well.  Your kidney function was normal.  Your liver function was not significantly elevated.  Your lipase was not elevated for your pancreas.  I suspect this is all related to your worsening diabetes and some weight gain as we discussed.  Please start the Metformin once daily and call your primary doctor to get seen in the next few days to discuss further management.  Please rest and stay hydrated.  If any symptoms change or worsen including new neurologic troubles or worsened vision troubles, please return to the nearest emergency department.

## 2020-06-07 LAB — URINE CULTURE

## 2020-06-14 DIAGNOSIS — Z9189 Other specified personal risk factors, not elsewhere classified: Secondary | ICD-10-CM | POA: Diagnosis not present

## 2020-06-14 DIAGNOSIS — R87612 Low grade squamous intraepithelial lesion on cytologic smear of cervix (LGSIL): Secondary | ICD-10-CM | POA: Diagnosis not present

## 2020-06-14 DIAGNOSIS — Z8041 Family history of malignant neoplasm of ovary: Secondary | ICD-10-CM | POA: Diagnosis not present

## 2020-06-14 DIAGNOSIS — Z01419 Encounter for gynecological examination (general) (routine) without abnormal findings: Secondary | ICD-10-CM | POA: Diagnosis not present

## 2020-06-15 ENCOUNTER — Other Ambulatory Visit: Payer: Self-pay | Admitting: Obstetrics and Gynecology

## 2020-06-15 DIAGNOSIS — Z1231 Encounter for screening mammogram for malignant neoplasm of breast: Secondary | ICD-10-CM

## 2020-06-21 DIAGNOSIS — K219 Gastro-esophageal reflux disease without esophagitis: Secondary | ICD-10-CM | POA: Diagnosis not present

## 2020-06-21 DIAGNOSIS — I10 Essential (primary) hypertension: Secondary | ICD-10-CM | POA: Diagnosis not present

## 2020-06-21 DIAGNOSIS — F419 Anxiety disorder, unspecified: Secondary | ICD-10-CM | POA: Diagnosis not present

## 2020-06-21 DIAGNOSIS — Z720 Tobacco use: Secondary | ICD-10-CM | POA: Diagnosis not present

## 2020-06-21 DIAGNOSIS — E119 Type 2 diabetes mellitus without complications: Secondary | ICD-10-CM | POA: Diagnosis not present

## 2020-06-21 DIAGNOSIS — E1140 Type 2 diabetes mellitus with diabetic neuropathy, unspecified: Secondary | ICD-10-CM | POA: Diagnosis not present

## 2020-06-21 DIAGNOSIS — E785 Hyperlipidemia, unspecified: Secondary | ICD-10-CM | POA: Diagnosis not present

## 2020-06-22 ENCOUNTER — Other Ambulatory Visit: Payer: Self-pay | Admitting: Obstetrics and Gynecology

## 2020-06-22 DIAGNOSIS — N644 Mastodynia: Secondary | ICD-10-CM

## 2020-08-10 ENCOUNTER — Ambulatory Visit: Payer: Medicare Other | Admitting: Podiatry

## 2020-08-25 ENCOUNTER — Ambulatory Visit: Payer: Medicare Other | Admitting: Family Medicine

## 2020-11-09 ENCOUNTER — Ambulatory Visit: Payer: Medicare Other | Attending: Family Medicine | Admitting: Family Medicine

## 2020-11-09 ENCOUNTER — Encounter: Payer: Self-pay | Admitting: Family Medicine

## 2020-11-09 ENCOUNTER — Other Ambulatory Visit: Payer: Self-pay

## 2020-11-09 VITALS — BP 140/90 | HR 89 | Resp 18 | Ht 64.0 in | Wt 203.8 lb

## 2020-11-09 DIAGNOSIS — I7300 Raynaud's syndrome without gangrene: Secondary | ICD-10-CM

## 2020-11-09 DIAGNOSIS — M79672 Pain in left foot: Secondary | ICD-10-CM | POA: Diagnosis not present

## 2020-11-09 DIAGNOSIS — R399 Unspecified symptoms and signs involving the genitourinary system: Secondary | ICD-10-CM | POA: Diagnosis not present

## 2020-11-09 DIAGNOSIS — B009 Herpesviral infection, unspecified: Secondary | ICD-10-CM | POA: Insufficient documentation

## 2020-11-09 DIAGNOSIS — Z7984 Long term (current) use of oral hypoglycemic drugs: Secondary | ICD-10-CM | POA: Diagnosis not present

## 2020-11-09 DIAGNOSIS — G8929 Other chronic pain: Secondary | ICD-10-CM | POA: Diagnosis not present

## 2020-11-09 DIAGNOSIS — M25562 Pain in left knee: Secondary | ICD-10-CM

## 2020-11-09 DIAGNOSIS — I10 Essential (primary) hypertension: Secondary | ICD-10-CM

## 2020-11-09 DIAGNOSIS — M79671 Pain in right foot: Secondary | ICD-10-CM

## 2020-11-09 DIAGNOSIS — Z79899 Other long term (current) drug therapy: Secondary | ICD-10-CM | POA: Insufficient documentation

## 2020-11-09 DIAGNOSIS — E1169 Type 2 diabetes mellitus with other specified complication: Secondary | ICD-10-CM | POA: Diagnosis present

## 2020-11-09 DIAGNOSIS — Z7901 Long term (current) use of anticoagulants: Secondary | ICD-10-CM | POA: Diagnosis not present

## 2020-11-09 DIAGNOSIS — M3500 Sicca syndrome, unspecified: Secondary | ICD-10-CM | POA: Insufficient documentation

## 2020-11-09 DIAGNOSIS — M069 Rheumatoid arthritis, unspecified: Secondary | ICD-10-CM

## 2020-11-09 DIAGNOSIS — Z1231 Encounter for screening mammogram for malignant neoplasm of breast: Secondary | ICD-10-CM | POA: Diagnosis not present

## 2020-11-09 LAB — POCT URINALYSIS DIP (CLINITEK)
Bilirubin, UA: NEGATIVE
Glucose, UA: NEGATIVE mg/dL
Ketones, POC UA: NEGATIVE mg/dL
Leukocytes, UA: NEGATIVE
Nitrite, UA: NEGATIVE
POC PROTEIN,UA: NEGATIVE
Spec Grav, UA: 1.02 (ref 1.010–1.025)
Urobilinogen, UA: 0.2 E.U./dL
pH, UA: 6 (ref 5.0–8.0)

## 2020-11-09 LAB — POCT GLYCOSYLATED HEMOGLOBIN (HGB A1C): HbA1c, POC (controlled diabetic range): 6 % (ref 0.0–7.0)

## 2020-11-09 LAB — GLUCOSE, POCT (MANUAL RESULT ENTRY): POC Glucose: 118 mg/dl — AB (ref 70–99)

## 2020-11-09 MED ORDER — METFORMIN HCL 850 MG PO TABS: 850.0000 mg | ORAL_TABLET | Freq: Every day | ORAL | 1 refills | Status: DC

## 2020-11-09 MED ORDER — ATORVASTATIN CALCIUM 20 MG PO TABS: 20.0000 mg | ORAL_TABLET | Freq: Every day | ORAL | 1 refills | Status: DC

## 2020-11-09 MED ORDER — AMLODIPINE BESYLATE 5 MG PO TABS: 5.0000 mg | ORAL_TABLET | Freq: Every day | ORAL | 1 refills | Status: DC

## 2020-11-09 NOTE — Patient Instructions (Signed)
Chronic Knee Pain, Adult Knee pain that lasts longer than 3 months is called chronic knee pain. You may have pain in one or both knees. Symptoms of chronic knee pain may also include swelling and stiffness. The most common cause is age-related wear and tear (osteoarthritis) of your knee joint. Many conditions can cause chronic knee pain. Treatment depends on the cause. The main treatments are physical therapy and weight loss. It may also be treated with medicines, injections, a knee sleeve or brace, and by using crutches. Rest, ice, pressure (compression), and elevation, also known as RICE therapy, may also be recommended. Follow these instructions at home: If you have a knee sleeve or brace:  Wear the knee sleeve or brace as told by your doctor. Take it off only as told by your doctor.  Loosen it if your toes: ? Tingle. ? Become numb. ? Turn cold and blue.  Keep it clean.  If the sleeve or brace is not waterproof: ? Do not let it get wet. ? Ask your doctor if you may take it off when you take a bath or shower. If not, cover it with a watertight covering.   Managing pain, stiffness, and swelling  If told, put heat on your knee. Do this as often as told by your doctor. Use the heat source that your doctor recommends, such as a moist heat pack or a heating pad. ? If you have a removable knee sleeve or brace, take it off as told by your doctor. ? Place a towel between your skin and the heat source. ? Leave the heat on for 20-30 minutes. ? Take off the heat if your skin turns bright red. This is very important. If you cannot feel pain, heat, or cold, you have a greater risk of getting burned.  If told, put ice on your knee. To do this: ? If you have a removable knee sleeve or brace, take it off as told by your doctor. ? Put ice in a plastic bag. ? Place a towel between your skin and the bag. ? Leave the ice on for 20 minutes, 2-3 times a day. ? Take off the ice if your skin turns bright  red. This is very important. If you cannot feel pain, heat, or cold, you have a greater risk of damage to the area.  Move your toes often.  Raise the injured area above the level of your heart while you are sitting or lying down.      Activity  Avoid activities where both feet leave the ground at the same time (high-impact activities). Examples are running, jumping rope, and doing jumping jacks.  Follow the exercise plan that your doctor makes for you. Your doctor may suggest that you: ? Avoid activities that make knee pain worse. You may need to change the exercises that you do, the sports that you participate in, or your job duties. ? Wear shoes with cushioned soles. ? Avoid sports that require running and sudden changes in direction. ? Do exercises or physical therapy. This is planned to match your needs and your abilities. ? Do exercises that increase your balance and strength, such as tai chi and yoga.  Do not use your injured knee to support your body weight until your doctor says that you can. Use crutches as told by your doctor.  Return to your normal activities when your doctor says that it is safe. General instructions  Take over-the-counter and prescription medicines only as told by your   doctor.  If you are overweight, work with your doctor and a food expert (dietitian) to set goals to lose weight. Being overweight can make your knee hurt more.  Do not smoke or use any products that contain nicotine or tobacco. If you need help quitting, ask your doctor.  Keep all follow-up visits. Contact a doctor if:  You have knee pain that is not getting better or gets worse.  You are not able to do your exercises due to knee pain. Get help right away if:  Your knee swells and the swelling gets worse.  You cannot move your knee.  You have very bad knee pain. Summary  Knee pain that lasts more than 3 months is called chronic knee pain.  The main treatments for chronic knee  pain are physical therapy and weight loss. You may also need to take medicines, wear a knee sleeve or brace, use crutches, and put ice or heat on your knee.  Lose weight if you are overweight. Work with your doctor and a food expert (dietitian) to help you set goals to lose weight. Being overweight can make your knee hurt more.  Follow the exercise plan that your doctor makes for you. This information is not intended to replace advice given to you by your health care provider. Make sure you discuss any questions you have with your health care provider. Document Revised: 12/23/2019 Document Reviewed: 12/23/2019 Elsevier Patient Education  2021 [employer]  

## 2020-11-09 NOTE — Progress Notes (Signed)
Subjective:  Patient ID: Victoria Holmes, female    DOB: Aug 08, 1980  Age: 40 y.o. MRN: [id number]  CC: Establish Care   HPI Victoria Holmes is a 40 year old female with a history of type 2 diabetes mellitus (A1c 6.0) rheumatoid arthritis, lupus, Raynaud's syndrome, Schizoaffective disorder, PTSD, on Valtrex for Herpes Simplex prophylaxis here to establish care. Followed by Dr Macky Lower of Rheumatology for her Lupus and is on Plaquenil. She is looking for a new Rheumatologist.  Informs me she also has a diagnosis of Sjogren's even though it is not listed in her chart.  Per patient when she reviewed her labs results were in keeping with Sjogren's. Previously followed by [facility].  She would like to be checked for UTI as her urine has had a strong odor but she denies dysuria or hematuria.  Also complains of a protrusion in her left knee with associated pain on flexing her knee.  Currently does not use any analgesic for her symptoms. She also has pain in her feet for which she has seen podiatry in the past and would like a new referral. Past Medical History:  Diagnosis Date  . Anemia    no current med.  . Anxiety   . Chronic tonsillitis 03/2012  . Depression   . Diabetes mellitus    diet-controlled  . Fibrocystic breast disease   . Headache(784.0)    migraines  . History of endometriosis   . Hyperlipidemia   . Hypertension    under control, has been on med. x 1 yr.  . Lupus ([place])   . Lupus ([place])   . Neuritis   . Panic attacks   . Raynauds disease   . Rheumatoid arthritis(714.0)   . Schizoaffective disorder ([place])   . Seizures ([place])    last seizure > 1 yr. ago  . Sinusitis   . Sleep disturbance   . Thyroid nodule     Past Surgical History:  Procedure Laterality Date  . ABDOMINAL HYSTERECTOMY  6 yrs. ago   complete  . CHOLECYSTECTOMY  6-8 yrs. ago  . FOOT SURGERY  05/2011   right great toe  . TONSILLECTOMY  04/22/2012   Procedure: TONSILLECTOMY;  Surgeon: Rozetta Nunnery, MD;  Location: [facility];  Service: ENT;  Laterality: N/A;    Family History  Problem Relation Age of Onset  . Diabetes Paternal Grandmother   . Hypertension Paternal Grandmother   . Kidney disease Paternal Grandmother   . Heart attack Maternal Grandmother        late 57's  . Arthritis Maternal Grandfather   . Cancer Maternal Grandfather        lung  . Lupus Other     Allergies  Allergen Reactions  . Amoxicillin Hives and Rash    Has patient had a PCN reaction causing immediate rash, facial/tongue/throat swelling, SOB or lightheadedness with hypotension: Yes Has patient had a PCN reaction causing severe rash involving mucus membranes or skin necrosis: Yes Has patient had a PCN reaction that required hospitalization: No Has patient had a PCN reaction occurring within the last 10 years: No If all of the above answers are "NO", then may proceed with Cephalosporin use.   . Azithromycin Shortness Of Breath and Rash  . Darvocet [Propoxyphene N-Acetaminophen] Shortness Of Breath and Rash  . Grapeseed Extract [Nutritional Supplements] Shortness Of Breath, Swelling and Rash    GRAPES  . Kiwi Extract Shortness Of Breath, Swelling and Rash  . Latex Shortness Of  Breath  . Macrolides And Ketolides Shortness Of Breath and Rash  . Morphine And Related Shortness Of Breath, Rash and Other (See Comments)    MUSCLE RIGIDITY  . Sulfa Antibiotics Shortness Of Breath    MUSCLE RIGIDITY  . Klonopin [Clonazepam] Rash and Other (See Comments)    HOMICIDAL THOUGHTS  . Penicillins Itching and Swelling    Swelling of mouth Has patient had a PCN reaction causing immediate rash, facial/tongue/throat swelling, SOB or lightheadedness with hypotension: Yes Has patient had a PCN reaction causing severe rash involving mucus membranes or skin necrosis: No Has patient had a PCN reaction that required hospitalization: No Has patient had a PCN reaction occurring within the last 10  years: No If all of the above answers are "NO", then may proceed with Cephalosporin use.   Marland Kitchen Zoloft [Sertraline Hcl] Other (See Comments)    TARDIVE DYSKINESIA  . Contrast Media [Iodinated Diagnostic Agents] Swelling    Pt CAN NOT have drinking contrast; She CAN have IV contrast; when pt drinks water soluble contrast, she develops shallow breathing and hives along with Herpes Type 2 around her eyes  . Flagyl [Metronidazole] Nausea And Vomiting  . Adhesive [Tape] Rash  . Concerta [Methylphenidate] Nausea Only  . Lexapro [Escitalopram Oxalate] Diarrhea and Rash  . Lortab [Hydrocodone-Acetaminophen] Rash    Outpatient Medications Prior to Visit  Medication Sig Dispense Refill  . EPINEPHrine (EPIPEN JR) 0.15 MG/0.3ML injection Inject 0.15 mg into the muscle as needed.    . hydroxychloroquine (PLAQUENIL) 200 MG tablet Take 200 mg by mouth 2 (two) times daily.    Marland Kitchen ibuprofen (ADVIL,MOTRIN) 200 MG tablet Take 200 mg by mouth every 6 (six) hours as needed for fever, headache, mild pain, moderate pain or cramping.    . pregabalin (LYRICA) 100 MG capsule Take 100 mg by mouth 2 (two) times daily.    . propranolol (INDERAL) 20 MG tablet Take 20 mg by mouth 2 (two) times daily.    Marland Kitchen tiZANidine (ZANAFLEX) 4 MG tablet Take 4 mg by mouth every 6 (six) hours as needed for muscle spasms.    . valACYclovir (VALTREX) 1000 MG tablet Take 1,000 mg by mouth daily.    Marland Kitchen amLODipine (NORVASC) 5 MG tablet Take 1 tablet (5 mg total) by mouth daily. 90 tablet 1  . metFORMIN (GLUCOPHAGE) 850 MG tablet Take 1 tablet (850 mg total) by mouth daily with breakfast. 30 tablet 0  . diclofenac sodium (VOLTAREN) 1 % GEL Apply 2 g topically 4 (four) times daily as needed (for pain). (Patient not taking: Reported on 11/09/2020)     No facility-administered medications prior to visit.     ROS Review of Systems  Constitutional: Negative for activity change, appetite change and fatigue.  HENT: Negative for congestion, sinus  pressure and sore throat.   Eyes: Negative for visual disturbance.  Respiratory: Negative for cough, chest tightness, shortness of breath and wheezing.   Cardiovascular: Negative for chest pain and palpitations.  Gastrointestinal: Negative for abdominal distention, abdominal pain and constipation.  Endocrine: Negative for polydipsia.  Genitourinary: Negative for dysuria and frequency.  Musculoskeletal:       See HPI  Skin: Negative for rash.  Neurological: Negative for tremors, light-headedness and numbness.  Hematological: Does not bruise/bleed easily.  Psychiatric/Behavioral: Negative for agitation and behavioral problems.    Objective:  BP 140/90   Pulse 89   Resp 18   Ht _0  (1.626 m)   Wt 203 lb 12.8 oz (92.4 kg)  SpO2 98%   BMI 34.98 kg/m   BP/Weight 11/09/2020 06/06/2020 62/13/0865  Systolic BP 784 696 295  Diastolic BP 90 87 96  Wt. (Lbs) 203.8 209 212  BMI 34.98 37.02 37.55      Physical Exam Constitutional:      Appearance: She is well-developed.  Neck:     Vascular: No JVD.  Cardiovascular:     Rate and Rhythm: Normal rate.     Heart sounds: Normal heart sounds. No murmur heard.   Pulmonary:     Effort: Pulmonary effort is normal.     Breath sounds: Normal breath sounds. No wheezing or rales.  Chest:     Chest wall: No tenderness.  Abdominal:     General: Bowel sounds are normal. There is no distension.     Palpations: Abdomen is soft. There is no mass.     Tenderness: There is no abdominal tenderness.  Musculoskeletal:        General: Normal range of motion.     Right lower leg: No edema.     Left lower leg: No edema.     Comments: Protrusion of left tibial tubercle  Neurological:     Mental Status: She is alert and oriented to person, place, and time.  Psychiatric:        Mood and Affect: Mood normal.     CMP Latest Ref Rng & Units 06/06/2020 03/18/2019 03/17/2019  Glucose 70 - 99 mg/dL 394(H) 150(H) 157(H)  BUN 6 - 20 mg/dL _0 Creatinine 0.44 - 1.00 mg/dL 0.77 0.90 1.00  Sodium 135 - 145 mmol/L 137 142 138  Potassium 3.5 - 5.1 mmol/L 4.7 3.4(L) 3.6  Chloride 98 - 111 mmol/L 99 103 102  CO2 22 - 32 mmol/L 28 - 26  Calcium 8.9 - 10.3 mg/dL 9.6 - 9.1  Total Protein 6.5 - 8.1 g/dL 8.5(H) - 7.8  Total Bilirubin 0.3 - 1.2 mg/dL 0.9 - 0.7  Alkaline Phos 38 - 126 U/L 99 - 84  AST 15 - 41 U/L 41 - 26  ALT 0 - 44 U/L 57(H) - 27    Lipid Panel  No results found for: CHOL, TRIG, HDL, CHOLHDL, VLDL, LDLCALC, LDLDIRECT  CBC    Component Value Date/Time   WBC 6.4 06/06/2020 1235   RBC 5.45 (H) 06/06/2020 1235   HGB 14.2 06/06/2020 1235   HCT 44.3 06/06/2020 1235   PLT 237 06/06/2020 1235   MCV 81.3 06/06/2020 1235   MCH 26.1 06/06/2020 1235   MCHC 32.1 06/06/2020 1235   RDW 12.8 06/06/2020 1235   LYMPHSABS 4.2 (H) 03/17/2019 2358   MONOABS 0.6 03/17/2019 2358   EOSABS 0.1 03/17/2019 2358   BASOSABS 0.0 03/17/2019 2358    Lab Results  Component Value Date   HGBA1C 6.0 11/09/2020     Assessment & Plan:  1. Type 2 diabetes mellitus with other specified complication, without long-term current use of insulin (HCC) Controlled Continue current regimen Counseled on Diabetic diet, my plate method, 284 minutes of moderate intensity exercise/week Blood sugar logs with fasting goals of 80-120 mg/dl, random of less than 180 and in the event of sugars less than 60 mg/dl or greater than 400 mg/dl encouraged to notify the clinic. Advised on the need for annual eye exams, annual foot exams, Pneumonia vaccine. - POCT glucose (manual entry) - POCT glycosylated hemoglobin (Hb A1C) - metFORMIN (GLUCOPHAGE) 850 MG tablet; Take 1 tablet (850 mg total) by mouth daily with breakfast.  Dispense: 90 tablet; Refill: 1 - CMP14+EGFR; Future - Lipid panel; Future - Microalbumin / creatinine urine ratio; Future - Microalbumin / creatinine urine ratio - Lipid panel - CMP14+EGFR  2. Rheumatoid arthritis involving multiple sites,  unspecified whether rheumatoid factor present (HCC) No acute flares - hydroxychloroquine (PLAQUENIL) 200 MG tablet; Take 200 mg by mouth 2 (two) times daily. - Ambulatory referral to Rheumatology  3. Raynaud's disease without gangrene Stable - Ambulatory referral to Rheumatology  4. Essential hypertension Controlled Counseled on blood pressure goal of less than 130/80, low-sodium, DASH diet, medication compliance, 150 minutes of moderate intensity exercise per week. Discussed medication compliance, adverse effects. - amLODipine (NORVASC) 5 MG tablet; Take 1 tablet (5 mg total) by mouth daily.  Dispense: 90 tablet; Refill: 1  5. Chronic pain of left knee Advised to use OTC Voltaren gel Weight loss will be beneficial Consider imaging if symptoms persist  6. Encounter for screening mammogram for malignant neoplasm of breast - MM 3D SCREEN BREAST BILATERAL; Future  7. Pain in both feet - Ambulatory referral to Podiatry  8. Urinary symptom or sign UA is negative for UTI - POCT URINALYSIS DIP (CLINITEK)  Meds ordered this encounter  Medications  . atorvastatin (LIPITOR) 20 MG tablet    Sig: Take 1 tablet (20 mg total) by mouth daily.    Dispense:  90 tablet    Refill:  1  . amLODipine (NORVASC) 5 MG tablet    Sig: Take 1 tablet (5 mg total) by mouth daily.    Dispense:  90 tablet    Refill:  1  . metFORMIN (GLUCOPHAGE) 850 MG tablet    Sig: Take 1 tablet (850 mg total) by mouth daily with breakfast.    Dispense:  90 tablet    Refill:  1          Charlott Rakes, MD, FAAFP. [facility] [place], [place]   11/10/2020, 2:33 PM

## 2020-11-10 LAB — MICROALBUMIN / CREATININE URINE RATIO
Creatinine, Urine: 23.3 mg/dL
Microalb/Creat Ratio: 23 mg/g creat (ref 0–29)
Microalbumin, Urine: 5.4 ug/mL

## 2020-11-11 LAB — CMP14+EGFR
ALT: 30 IU/L (ref 0–32)
AST: 21 IU/L (ref 0–40)
Albumin/Globulin Ratio: 1.3 (ref 1.2–2.2)
Albumin: 4.4 g/dL (ref 3.8–4.8)
Alkaline Phosphatase: 92 IU/L (ref 44–121)
BUN/Creatinine Ratio: 12 (ref 9–23)
BUN: 9 mg/dL (ref 6–24)
Bilirubin Total: 0.3 mg/dL (ref 0.0–1.2)
CO2: 23 mmol/L (ref 20–29)
Calcium: 9.8 mg/dL (ref 8.7–10.2)
Chloride: 104 mmol/L (ref 96–106)
Creatinine, Ser: 0.74 mg/dL (ref 0.57–1.00)
Globulin, Total: 3.4 g/dL (ref 1.5–4.5)
Glucose: 110 mg/dL — ABNORMAL HIGH (ref 65–99)
Potassium: 4.7 mmol/L (ref 3.5–5.2)
Sodium: 145 mmol/L — ABNORMAL HIGH (ref 134–144)
Total Protein: 7.8 g/dL (ref 6.0–8.5)
eGFR: 105 mL/min/{1.73_m2} (ref >59)

## 2020-11-11 LAB — LIPID PANEL
Chol/HDL Ratio: 4.3 ratio (ref 0.0–4.4)
Cholesterol, Total: 189 mg/dL (ref 100–199)
HDL: 44 mg/dL (ref >39)
LDL Chol Calc (NIH): 118 mg/dL — ABNORMAL HIGH (ref 0–99)
Triglycerides: 150 mg/dL — ABNORMAL HIGH (ref 0–149)
VLDL Cholesterol Cal: 27 mg/dL (ref 5–40)

## 2020-11-15 ENCOUNTER — Ambulatory Visit (INDEPENDENT_AMBULATORY_CARE_PROVIDER_SITE_OTHER): Payer: Medicare Other | Admitting: Podiatry

## 2020-11-15 ENCOUNTER — Ambulatory Visit (INDEPENDENT_AMBULATORY_CARE_PROVIDER_SITE_OTHER): Payer: Medicare Other

## 2020-11-15 ENCOUNTER — Other Ambulatory Visit: Payer: Self-pay

## 2020-11-15 DIAGNOSIS — M778 Other enthesopathies, not elsewhere classified: Secondary | ICD-10-CM | POA: Diagnosis not present

## 2020-11-15 DIAGNOSIS — M069 Rheumatoid arthritis, unspecified: Secondary | ICD-10-CM | POA: Diagnosis not present

## 2020-11-15 NOTE — Progress Notes (Signed)
Subjective:  Patient ID: Victoria Holmes, female    DOB: 08/14/1980,  MRN: [id number] HPI Chief Complaint  Patient presents with  . Foot Pain    Bilateral foot pain     40 y.o. female presents with the above complaint.   ROS: Denies fever chills nausea vomiting muscle pain does relate joint pain leg pain foot pain.  Goes on to say that she needs a new rheumatologist and she has been sparing her Plaquenil.  Feels like she has been having flares in her legs hands.  Past Medical History:  Diagnosis Date  . Anemia    no current med.  . Anxiety   . Chronic tonsillitis 03/2012  . Depression   . Diabetes mellitus    diet-controlled  . Fibrocystic breast disease   . Headache(784.0)    migraines  . History of endometriosis   . Hyperlipidemia   . Hypertension    under control, has been on med. x 1 yr.  . Lupus ([place])   . Lupus ([place])   . Neuritis   . Panic attacks   . Raynauds disease   . Rheumatoid arthritis(714.0)   . Schizoaffective disorder ([place])   . Seizures ([place])    last seizure > 1 yr. ago  . Sinusitis   . Sleep disturbance   . Thyroid nodule    Past Surgical History:  Procedure Laterality Date  . ABDOMINAL HYSTERECTOMY  6 yrs. ago   complete  . CHOLECYSTECTOMY  6-8 yrs. ago  . FOOT SURGERY  05/2011   right great toe  . TONSILLECTOMY  04/22/2012   Procedure: TONSILLECTOMY;  Surgeon: Rozetta Nunnery, MD;  Location: [facility];  Service: ENT;  Laterality: N/A;    Current Outpatient Medications:  .  amLODipine (NORVASC) 5 MG tablet, Take 1 tablet (5 mg total) by mouth daily., Disp: 90 tablet, Rfl: 1 .  atorvastatin (LIPITOR) 20 MG tablet, Take 1 tablet (20 mg total) by mouth daily., Disp: 90 tablet, Rfl: 1 .  diclofenac sodium (VOLTAREN) 1 % GEL, Apply 2 g topically 4 (four) times daily as needed (for pain). (Patient not taking: Reported on 11/09/2020), Disp: , Rfl:  .  EPINEPHrine (EPIPEN JR) 0.15 MG/0.3ML injection, Inject 0.15 mg into the muscle as  needed., Disp: , Rfl:  .  hydroxychloroquine (PLAQUENIL) 200 MG tablet, Take 200 mg by mouth 2 (two) times daily., Disp: , Rfl:  .  ibuprofen (ADVIL,MOTRIN) 200 MG tablet, Take 200 mg by mouth every 6 (six) hours as needed for fever, headache, mild pain, moderate pain or cramping., Disp: , Rfl:  .  metFORMIN (GLUCOPHAGE) 850 MG tablet, Take 1 tablet (850 mg total) by mouth daily with breakfast., Disp: 90 tablet, Rfl: 1 .  pregabalin (LYRICA) 100 MG capsule, Take 100 mg by mouth 2 (two) times daily., Disp: , Rfl:  .  propranolol (INDERAL) 20 MG tablet, Take 20 mg by mouth 2 (two) times daily., Disp: , Rfl:  .  tiZANidine (ZANAFLEX) 4 MG tablet, Take 4 mg by mouth every 6 (six) hours as needed for muscle spasms., Disp: , Rfl:  .  valACYclovir (VALTREX) 1000 MG tablet, Take 1,000 mg by mouth daily., Disp: , Rfl:   Allergies  Allergen Reactions  . Amoxicillin Hives and Rash    Has patient had a PCN reaction causing immediate rash, facial/tongue/throat swelling, SOB or lightheadedness with hypotension: Yes Has patient had a PCN reaction causing severe rash involving mucus membranes or skin necrosis: Yes Has patient had  a PCN reaction that required hospitalization: No Has patient had a PCN reaction occurring within the last 10 years: No If all of the above answers are "NO", then may proceed with Cephalosporin use.   . Azithromycin Shortness Of Breath and Rash  . Darvocet [Propoxyphene N-Acetaminophen] Shortness Of Breath and Rash  . Grapeseed Extract [Nutritional Supplements] Shortness Of Breath, Swelling and Rash    GRAPES  . Kiwi Extract Shortness Of Breath, Swelling and Rash  . Latex Shortness Of Breath  . Macrolides And Ketolides Shortness Of Breath and Rash  . Morphine And Related Shortness Of Breath, Rash and Other (See Comments)    MUSCLE RIGIDITY  . Sulfa Antibiotics Shortness Of Breath    MUSCLE RIGIDITY  . Klonopin [Clonazepam] Rash and Other (See Comments)    HOMICIDAL THOUGHTS  .  Penicillins Itching and Swelling    Swelling of mouth Has patient had a PCN reaction causing immediate rash, facial/tongue/throat swelling, SOB or lightheadedness with hypotension: Yes Has patient had a PCN reaction causing severe rash involving mucus membranes or skin necrosis: No Has patient had a PCN reaction that required hospitalization: No Has patient had a PCN reaction occurring within the last 10 years: No If all of the above answers are "NO", then may proceed with Cephalosporin use.   Marland Kitchen Zoloft [Sertraline Hcl] Other (See Comments)    TARDIVE DYSKINESIA  . Contrast Media [Iodinated Diagnostic Agents] Swelling    Pt CAN NOT have drinking contrast; She CAN have IV contrast; when pt drinks water soluble contrast, she develops shallow breathing and hives along with Herpes Type 2 around her eyes  . Flagyl [Metronidazole] Nausea And Vomiting  . Adhesive [Tape] Rash  . Concerta [Methylphenidate] Nausea Only  . Lexapro [Escitalopram Oxalate] Diarrhea and Rash  . Lortab [Hydrocodone-Acetaminophen] Rash   Review of Systems Objective:  There were no vitals filed for this visit.  General: Well developed, nourished, in no acute distress, alert and oriented x3   Dermatological: Skin is warm, dry and supple bilateral. Nails x 10 are well maintained; remaining integument appears unremarkable at this time. There are no open sores, no preulcerative lesions, no rash or signs of infection present.  Vascular: Dorsalis Pedis artery and Posterior Tibial artery pedal pulses are 2/4 bilateral with immedate capillary fill time. Pedal hair growth present. No varicosities and no lower extremity edema present bilateral.   Neruologic: Grossly intact via light touch bilateral. Vibratory intact via tuning fork bilateral. Protective threshold with Semmes Wienstein monofilament intact to all pedal sites bilateral. Patellar and Achilles deep tendon reflexes 2+ bilateral. No Babinski or clonus noted bilateral.    Musculoskeletal: No gross boney pedal deformities bilateral. No pain, crepitus, or limitation noted with foot and ankle range of motion bilateral. Muscular strength 5/5 in all groups tested bilateral.  Pain on palpation bilateral foot.  Pretty much everyplace with exception of her heel is painful on palpation or range of motion.  They are warm to the touch and minimally edematous.  Gait: Unassisted, Nonantalgic.    Radiographs:  Radiographs taken today demonstrate an osseously mature individual no acute findings are noted.  Mild pes planus is noted.  Soft tissue margins appear normal.  Assessment & Plan:   Assessment: Probable rheumatoid or lupus flare.  Plan: Follow-up with rheumatology and discussed the use of diclofenac gel for symptoms.     Daiya Tamer T. [place], [place]

## 2020-11-24 ENCOUNTER — Other Ambulatory Visit: Payer: Self-pay | Admitting: Family Medicine

## 2020-11-24 DIAGNOSIS — I10 Essential (primary) hypertension: Secondary | ICD-10-CM

## 2020-11-24 NOTE — Progress Notes (Unsigned)
Can you please refer to [facility] for second opinion. She was previously seeing Dr Twanna Hy. [facility] will not schedule an appointment for her until they receive her records. Thanks

## 2020-12-26 ENCOUNTER — Other Ambulatory Visit: Payer: Self-pay | Admitting: Family Medicine

## 2020-12-26 NOTE — Telephone Encounter (Signed)
Requested medication (s) are due for refill today- not sure  Requested medication (s) are on the active medication list -yes  Future visit scheduled yes  Last refill: 10/26/20  Notes to clinic: Request medication listed as historical provider  Requested Prescriptions  Pending Prescriptions Disp Refills   propranolol (INDERAL) 20 MG tablet [Pharmacy Med Name: propranolol 20 mg tablet] 120 tablet 1    Sig: 2 tab(s) orally 2 times a day 30 day(s)      Cardiovascular:  Beta Blockers Failed - 12/26/2020  4:15 PM      Failed - Last BP in normal range    BP Readings from Last 1 Encounters:  11/09/20 140/90          Passed - Last Heart Rate in normal range    Pulse Readings from Last 1 Encounters:  11/09/20 89          Passed - Valid encounter within last 6 months    Recent Outpatient Visits           1 month ago Urinary symptom or sign   Limon, Charlane Ferretti, MD       Future Appointments             In 1 month Charlott Rakes, MD [place]                 Requested Prescriptions  Pending Prescriptions Disp Refills   propranolol (INDERAL) 20 MG tablet [Pharmacy Med Name: propranolol 20 mg tablet] 120 tablet 1    Sig: 2 tab(s) orally 2 times a day 30 day(s)      Cardiovascular:  Beta Blockers Failed - 12/26/2020  4:15 PM      Failed - Last BP in normal range    BP Readings from Last 1 Encounters:  11/09/20 140/90          Passed - Last Heart Rate in normal range    Pulse Readings from Last 1 Encounters:  11/09/20 89          Passed - Valid encounter within last 6 months    Recent Outpatient Visits           1 month ago Urinary symptom or sign   Kerrick, MD       Future Appointments             In 1 month Charlott Rakes, MD [place]

## 2020-12-27 NOTE — Telephone Encounter (Signed)
Patient called in about the status of her medication refill, pt states she will be out of her medication tonight. Pt request a call back. Please advise

## 2021-01-04 ENCOUNTER — Telehealth: Payer: Self-pay | Admitting: Family Medicine

## 2021-01-04 NOTE — Telephone Encounter (Signed)
I sent a second referral to another Rheumatologist below: '[place] [facility]  Ph# [phone] address [place] [address]'  She can call them for an appointment and does not have to wait until she receives her records prior to doing so since [facility] would not see her without receiving records.

## 2021-01-04 NOTE — Telephone Encounter (Signed)
Copied from [place] [id number]. Topic: General - Other >> Dec 29, 2020  8:21 AM Alanda Slim E wrote: Reason for CRM: Pt wanted to let Dr. Margarita Rana know that she is still trying to get her records from her old rheumatologist to the new location/ FYI

## 2021-01-04 NOTE — Telephone Encounter (Signed)
FYI

## 2021-01-05 NOTE — Telephone Encounter (Signed)
Patient was called and informed of new referral being placed.

## 2021-01-06 ENCOUNTER — Telehealth: Payer: Self-pay | Admitting: Family Medicine

## 2021-01-06 ENCOUNTER — Other Ambulatory Visit: Payer: Self-pay

## 2021-01-06 DIAGNOSIS — E1169 Type 2 diabetes mellitus with other specified complication: Secondary | ICD-10-CM

## 2021-01-06 MED ORDER — METFORMIN HCL 850 MG PO TABS: 850.0000 mg | ORAL_TABLET | Freq: Every day | ORAL | 1 refills | Status: DC

## 2021-01-06 NOTE — Telephone Encounter (Signed)
Pt called saying [place] called the office yesterday about her prescription of Metformin. I do not see where it was requested.  It was requested in April but not sent in.  Pt is completely out and needs the refill asap.  [phone]

## 2021-01-06 NOTE — Telephone Encounter (Signed)
Medication has been refilled and sent to pharmacy  

## 2021-01-24 ENCOUNTER — Other Ambulatory Visit: Payer: Medicare Other

## 2021-01-24 ENCOUNTER — Other Ambulatory Visit: Payer: Self-pay | Admitting: Family Medicine

## 2021-01-24 NOTE — Telephone Encounter (Signed)
Last RF 12/28/20  #120 1 RF

## 2021-02-06 ENCOUNTER — Other Ambulatory Visit: Payer: Self-pay | Admitting: Family Medicine

## 2021-02-07 ENCOUNTER — Encounter: Payer: Self-pay | Admitting: Family Medicine

## 2021-02-07 ENCOUNTER — Ambulatory Visit: Payer: Medicare Other | Attending: Family Medicine | Admitting: Family Medicine

## 2021-02-07 ENCOUNTER — Other Ambulatory Visit: Payer: Self-pay

## 2021-02-07 DIAGNOSIS — I10 Essential (primary) hypertension: Secondary | ICD-10-CM | POA: Diagnosis not present

## 2021-02-07 DIAGNOSIS — M069 Rheumatoid arthritis, unspecified: Secondary | ICD-10-CM

## 2021-02-07 DIAGNOSIS — E1169 Type 2 diabetes mellitus with other specified complication: Secondary | ICD-10-CM

## 2021-02-07 MED ORDER — ACCU-CHEK GUIDE VI STRP: ORAL_STRIP | 12 refills | Status: DC

## 2021-02-07 MED ORDER — DAPAGLIFLOZIN PROPANEDIOL 5 MG PO TABS: 5.0000 mg | ORAL_TABLET | Freq: Every day | ORAL | 3 refills | Status: DC

## 2021-02-07 MED ORDER — PROPRANOLOL HCL 20 MG PO TABS: ORAL_TABLET | ORAL | 3 refills | Status: DC

## 2021-02-07 NOTE — Progress Notes (Signed)
Virtual Visit via Telephone Note  I connected with Victoria Holmes, on 02/07/2021 at 10:16 AM by telephone due to the COVID-19 pandemic and verified that I am speaking with the correct person using two identifiers.   Consent: I discussed the limitations, risks, security and privacy concerns of performing an evaluation and management service by telephone and the availability of in person appointments. I also discussed with the patient that there may be a patient responsible charge related to this service. The patient expressed understanding and agreed to proceed.   Location of Patient: Home  Location of Provider: Clinic   Persons participating in Telemedicine visit: Victoria Holmes Dr. Margarita Rana     History of Present Illness: Victoria Holmes is a 40 year old female with a history of type 2 diabetes mellitus (A1c 6.0) rheumatoid arthritis, lupus, Raynaud's syndrome, Schizoaffective disorder, PTSD, on Valtrex for Herpes Simplex prophylaxis   She has been sick on Metformin but is on Plaquenil as well and is unsure if symptoms or from Plaquenil She has been stretching her Plaquenil as she has been unable to see Rheumatology. [facility] would not see her until she got her medical records and I referred her to [place] and was also informed that she will need to obtain her records from her previous Rheumatologist Dr Dossie Der but she is unable to do this as she is required to pay prior to obtaining her records and she is on a fixed income.    Blood sugars are 84 fasting. She has had no hypoglycemic episodes. Past Medical History:  Diagnosis Date   Anemia    no current med.   Anxiety    Chronic tonsillitis 03/2012   Depression    Diabetes mellitus    diet-controlled   Fibrocystic breast disease    Headache(784.0)    migraines   History of endometriosis    Hyperlipidemia    Hypertension    under control, has been on med. x 1 yr.   Lupus (HCC)    Lupus (HCC)    Neuritis     Panic attacks    Raynauds disease    Rheumatoid arthritis(714.0)    Schizoaffective disorder (HCC)    Seizures (HCC)    last seizure > 1 yr. ago   Sinusitis    Sleep disturbance    Thyroid nodule    Allergies  Allergen Reactions   Amoxicillin Hives and Rash    Has patient had a PCN reaction causing immediate rash, facial/tongue/throat swelling, SOB or lightheadedness with hypotension: Yes Has patient had a PCN reaction causing severe rash involving mucus membranes or skin necrosis: Yes Has patient had a PCN reaction that required hospitalization: No Has patient had a PCN reaction occurring within the last 10 years: No If all of the above answers are "NO", then may proceed with Cephalosporin use.    Azithromycin Shortness Of Breath and Rash   Darvocet [Propoxyphene N-Acetaminophen] Shortness Of Breath and Rash   Grapeseed Extract [Nutritional Supplements] Shortness Of Breath, Swelling and Rash    GRAPES   Kiwi Extract Shortness Of Breath, Swelling and Rash   Latex Shortness Of Breath   Macrolides And Ketolides Shortness Of Breath and Rash   Morphine And Related Shortness Of Breath, Rash and Other (See Comments)    MUSCLE RIGIDITY   Sulfa Antibiotics Shortness Of Breath    MUSCLE RIGIDITY   Klonopin [Clonazepam] Rash and Other (See Comments)    HOMICIDAL THOUGHTS   Penicillins Itching and Swelling    Swelling  of mouth Has patient had a PCN reaction causing immediate rash, facial/tongue/throat swelling, SOB or lightheadedness with hypotension: Yes Has patient had a PCN reaction causing severe rash involving mucus membranes or skin necrosis: No Has patient had a PCN reaction that required hospitalization: No Has patient had a PCN reaction occurring within the last 10 years: No If all of the above answers are "NO", then may proceed with Cephalosporin use.    Zoloft [Sertraline Hcl] Other (See Comments)    TARDIVE DYSKINESIA   Contrast Media [Iodinated Diagnostic Agents] Swelling     Pt CAN NOT have drinking contrast; She CAN have IV contrast; when pt drinks water soluble contrast, she develops shallow breathing and hives along with Herpes Type 2 around her eyes   Flagyl [Metronidazole] Nausea And Vomiting   Adhesive [Tape] Rash   Concerta [Methylphenidate] Nausea Only   Lexapro [Escitalopram Oxalate] Diarrhea and Rash   Lortab [Hydrocodone-Acetaminophen] Rash    Current Outpatient Medications on File Prior to Visit  Medication Sig Dispense Refill   amLODipine (NORVASC) 5 MG tablet Take 1 tablet (5 mg total) by mouth daily. 90 tablet 1   atorvastatin (LIPITOR) 20 MG tablet Take 1 tablet (20 mg total) by mouth daily. 90 tablet 2   diclofenac sodium (VOLTAREN) 1 % GEL Apply 2 g topically 4 (four) times daily as needed (for pain). (Patient not taking: Reported on 11/09/2020)     EPINEPHrine (EPIPEN JR) 0.15 MG/0.3ML injection Inject 0.15 mg into the muscle as needed.     hydroxychloroquine (PLAQUENIL) 200 MG tablet Take 200 mg by mouth 2 (two) times daily.     ibuprofen (ADVIL,MOTRIN) 200 MG tablet Take 200 mg by mouth every 6 (six) hours as needed for fever, headache, mild pain, moderate pain or cramping.     metFORMIN (GLUCOPHAGE) 850 MG tablet Take 1 tablet (850 mg total) by mouth daily with breakfast. 90 tablet 1   pregabalin (LYRICA) 100 MG capsule Take 100 mg by mouth 2 (two) times daily.     propranolol (INDERAL) 20 MG tablet 2 tab(s) orally 2 times a day 30 day(s) 120 tablet 1   tiZANidine (ZANAFLEX) 4 MG tablet Take 4 mg by mouth every 6 (six) hours as needed for muscle spasms.     valACYclovir (VALTREX) 1000 MG tablet Take 1,000 mg by mouth daily.     No current facility-administered medications on file prior to visit.    ROS: See HPI Awake, alert, ranted x3 Not in acute distress Normal mood  Observations/Objective: Awake, alert, ranted x3 Not in acute distress Normal mood   Lab Results  Component Value Date   HGBA1C 6.0 11/09/2020    CMP  Latest Ref Rng & Units 11/10/2020 06/06/2020 03/18/2019  Glucose 65 - 99 mg/dL 110(H) 394(H) 150(H)  BUN 6 - 24 mg/dL 9 10 10   Creatinine 0.57 - 1.00 mg/dL 0.74 0.77 0.90  Sodium 134 - 144 mmol/L 145(H) 137 142  Potassium 3.5 - 5.2 mmol/L 4.7 4.7 3.4(L)  Chloride 96 - 106 mmol/L 104 99 103  CO2 20 - 29 mmol/L 23 28 -  Calcium 8.7 - 10.2 mg/dL 9.8 9.6 -  Total Protein 6.0 - 8.5 g/dL 7.8 8.5(H) -  Total Bilirubin 0.0 - 1.2 mg/dL 0.3 0.9 -  Alkaline Phos 44 - 121 IU/L 92 99 -  AST 0 - 40 IU/L 21 41 -  ALT 0 - 32 IU/L 30 57(H) -    Assessment and Plan: 1. Type 2 diabetes mellitus with  other specified complication, without long-term current use of insulin (HCC) Controlled with A1c of 6.0 Due to GI side effects of metformin I have substituted with [place] Counseled on blood pressure goal of less than 130/80, low-sodium, DASH diet, medication compliance, 150 minutes of moderate intensity exercise per week. Discussed medication compliance, adverse effects. - dapagliflozin propanediol (FARXIGA) 5 MG TABS tablet; Take 1 tablet (5 mg total) by mouth daily before breakfast.  Dispense: 30 tablet; Refill: 3 - glucose blood (ACCU-CHEK GUIDE) test strip; Use as instructed tid  Dispense: 100 each; Refill: 12  2. Rheumatoid arthritis involving multiple sites, unspecified whether rheumatoid factor present ([place]) Uncontrolled due to having to stretch out her Plaquenil She needs to be seen by Rheumatology and needs to obtain her old records to secure an appointment with a new rheumatologist Currently on Plaquenil  3. Essential hypertension Stable Continue amlodipine Counseled on blood pressure goal of less than 130/80, low-sodium, DASH diet, medication compliance, 150 minutes of moderate intensity exercise per week. Discussed medication compliance, adverse effects.   Follow Up Instructions: 3 months   I discussed the assessment and treatment plan with the patient. The patient was provided an  opportunity to ask questions and all were answered. The patient agreed with the plan and demonstrated an understanding of the instructions.   The patient was advised to call back or seek an in-person evaluation if the symptoms worsen or if the condition fails to improve as anticipated.     I provided 13 minutes total of non-face-to-face time during this encounter.   Charlott Rakes, MD, FAAFP. [facility] and [place], [place]   02/07/2021, 10:16 AM

## 2021-02-22 ENCOUNTER — Other Ambulatory Visit: Payer: Self-pay

## 2021-02-22 ENCOUNTER — Ambulatory Visit
Admission: RE | Admit: 2021-02-22 | Discharge: 2021-02-22 | Disposition: A | Discharge status: Home / Self Care | Payer: Medicare Other | Source: Ambulatory Visit | Attending: Family Medicine | Admitting: Family Medicine

## 2021-02-22 DIAGNOSIS — Z1231 Encounter for screening mammogram for malignant neoplasm of breast: Secondary | ICD-10-CM

## 2021-02-23 ENCOUNTER — Other Ambulatory Visit: Payer: Medicare Other

## 2021-02-27 NOTE — Telephone Encounter (Signed)
Pt wanted the referral for rheumatology to be sent to a facility in [place] if possible, please advise.

## 2021-02-27 NOTE — Telephone Encounter (Signed)
Pt called this morning asking for a referral to a rheumotology in [place].  Pt also stated she has a broken tooth and wants to know if she can be referred to a dentist?  Cb#  [phone]

## 2021-03-14 LAB — HM DIABETES EYE EXAM

## 2021-03-15 ENCOUNTER — Other Ambulatory Visit: Payer: Self-pay

## 2021-03-15 ENCOUNTER — Other Ambulatory Visit: Payer: Self-pay | Admitting: Family Medicine

## 2021-03-15 DIAGNOSIS — E1169 Type 2 diabetes mellitus with other specified complication: Secondary | ICD-10-CM

## 2021-03-15 MED ORDER — METFORMIN HCL 850 MG PO TABS: 850.0000 mg | ORAL_TABLET | Freq: Every day | ORAL | 1 refills | Status: DC

## 2021-03-15 MED ORDER — ACCU-CHEK GUIDE VI STRP: ORAL_STRIP | 12 refills | Status: DC

## 2021-03-15 NOTE — Telephone Encounter (Signed)
}  Notes to clinic:  Patient has appt on 05/11/2021 Review for 90 day supply Failed protocol: AA eGFR in normal range and within 360 days   Requested Prescriptions  Pending Prescriptions Disp Refills   metFORMIN (GLUCOPHAGE) 850 MG tablet 90 tablet 1    Sig: Take 1 tablet (850 mg total) by mouth daily with breakfast.     Endocrinology:  Diabetes - Biguanides Failed - 03/15/2021 10:34 AM      Failed - AA eGFR in normal range and within 360 days    GFR calc Af Amer  Date Value Ref Range Status  03/17/2019 >60 >60 mL/min Final   GFR, Estimated  Date Value Ref Range Status  06/06/2020 >60 >60 mL/min Final    Comment:    (NOTE) Calculated using the CKD-EPI Creatinine Equation (2021)    GFR  Date Value Ref Range Status  02/01/2015 110.19 >60.00 mL/min Final   eGFR  Date Value Ref Range Status  11/10/2020 105 >59 mL/min/1.73 Final          Passed - Cr in normal range and within 360 days    Creatinine, Ser  Date Value Ref Range Status  11/10/2020 0.74 0.57 - 1.00 mg/dL Final          Passed - HBA1C is between 0 and 7.9 and within 180 days    HbA1c, POC (controlled diabetic range)  Date Value Ref Range Status  11/09/2020 6.0 0.0 - 7.0 % Final          Passed - Valid encounter within last 6 months    Recent Outpatient Visits           1 month ago Type 2 diabetes mellitus with other specified complication, without long-term current use of insulin ([place])   [place], Charlane Ferretti, MD   4 months ago Urinary symptom or sign   [place], Enobong, MD       Future Appointments             In 1 month Charlott Rakes, MD [address]

## 2021-03-15 NOTE — Telephone Encounter (Signed)
Copied from [place] [id number]. Topic: Quick Communication - Rx Refill/Question >> Mar 15, 2021 10:29 AM Tessa Lerner A wrote: Medication: metFORMIN (GLUCOPHAGE) 850 MG tablet [id number]   Has the patient contacted their pharmacy? No. (Agent: If no, request that the patient contact the pharmacy for the refill.) (Agent: If yes, when and what did the pharmacy advise?)  Preferred Pharmacy (with phone number or street name): [place], [place]  Phone:  [phone] Fax:  [phone]  Agent: Please be advised that RX refills may take up to 3 business days. We ask that you follow-up with your pharmacy.

## 2021-03-15 NOTE — Telephone Encounter (Signed)
Oley Balm, from [employer], calling on behalf of pt. She states that pt called them to have her Accu Check Guide Test Strips sent to them. Oley Balm states that they are not able to give pt this prescription due to her having part B for her insurance. She states that the pt will need to have this sent instead to [facility] so that insurance will cover. Please advise.   [facility] [place], [address] AT [facility] [address]  [place] [address]  Phone: [phone] Fax: [phone]  Hours: Not open 24 hours

## 2021-03-15 NOTE — Telephone Encounter (Signed)
Patient would like glucose blood (ACCU-CHEK GUIDE) test strip sent to the pharmacy below due to her insurance covering the strips only at the below. Patient states she has been out for 1 week. She had to purchase OTC strips now she is out. Patient would like request expedited.   [address], [place], [place] [address]  [phone]

## 2021-03-15 NOTE — Telephone Encounter (Signed)
Called patient and reviewed insurance coverage for Accu Check Guide Test Strips and patient would like refill request sent to [facility].

## 2021-03-16 ENCOUNTER — Telehealth: Payer: Self-pay | Admitting: Family Medicine

## 2021-03-16 DIAGNOSIS — M069 Rheumatoid arthritis, unspecified: Secondary | ICD-10-CM

## 2021-03-16 DIAGNOSIS — S025XXA Fracture of tooth (traumatic), initial encounter for closed fracture: Secondary | ICD-10-CM

## 2021-03-16 NOTE — Telephone Encounter (Signed)
Copied from [place] [id number]. Topic: Referral - Status >> Mar 15, 2021  1:18 PM Scherrie Gerlach wrote: Reason for CRM: pt states [facility] not [facility] referral that was laced back in April. It was closed.  Can you resend?  In addition, pt needs urgent referral for broken tooth at the gums w/ nerves exposed? Pt has medicare/medicaid.   Dorma Russell would a new referral need to be sent by Dr. Margarita Rana for Rheum or can you work from the one back in April?

## 2021-03-17 ENCOUNTER — Encounter: Payer: Self-pay | Admitting: Family Medicine

## 2021-03-17 DIAGNOSIS — E118 Type 2 diabetes mellitus with unspecified complications: Secondary | ICD-10-CM | POA: Insufficient documentation

## 2021-03-20 ENCOUNTER — Other Ambulatory Visit: Payer: Self-pay | Admitting: Family Medicine

## 2021-03-20 DIAGNOSIS — I10 Essential (primary) hypertension: Secondary | ICD-10-CM

## 2021-03-20 NOTE — Telephone Encounter (Signed)
Referral to Rheumatology and Dentist have been placed.

## 2021-03-20 NOTE — Telephone Encounter (Signed)
Pt needs new referral.

## 2021-03-22 ENCOUNTER — Telehealth: Payer: Self-pay | Admitting: Family Medicine

## 2021-03-22 NOTE — Telephone Encounter (Signed)
I called patient regarding her Rheumatology referral and she said that her insurance don't cover the meter and strips  and need a new prescription she use  [facility] [place]   Thank You .

## 2021-03-22 NOTE — Telephone Encounter (Signed)
Pt was called and a VM was left informing patient to return phone call.

## 2021-03-23 ENCOUNTER — Other Ambulatory Visit: Payer: Self-pay | Admitting: Pharmacist

## 2021-03-23 ENCOUNTER — Other Ambulatory Visit: Payer: Self-pay

## 2021-03-23 DIAGNOSIS — E1169 Type 2 diabetes mellitus with other specified complication: Secondary | ICD-10-CM

## 2021-03-23 MED ORDER — ACCU-CHEK GUIDE VI STRP: ORAL_STRIP | 3 refills | Status: DC

## 2021-03-23 MED ORDER — ACCU-CHEK GUIDE ME W/DEVICE KIT: PACK | 0 refills | Status: DC

## 2021-03-23 MED ORDER — ACCU-CHEK SOFTCLIX LANCETS MISC: 3 refills | Status: AC

## 2021-03-24 ENCOUNTER — Telehealth: Payer: Self-pay | Admitting: Family Medicine

## 2021-03-27 ENCOUNTER — Emergency Department (HOSPITAL_COMMUNITY)
Admission: EM | Admit: 2021-03-27 | Discharge: 2021-03-27 | Disposition: A | Discharge status: Home / Self Care | Payer: Medicare Other | Attending: Emergency Medicine | Admitting: Emergency Medicine

## 2021-03-27 ENCOUNTER — Emergency Department (HOSPITAL_COMMUNITY): Payer: Medicare Other

## 2021-03-27 ENCOUNTER — Other Ambulatory Visit: Payer: Self-pay

## 2021-03-27 DIAGNOSIS — R109 Unspecified abdominal pain: Secondary | ICD-10-CM | POA: Diagnosis not present

## 2021-03-27 DIAGNOSIS — R319 Hematuria, unspecified: Secondary | ICD-10-CM

## 2021-03-27 DIAGNOSIS — E119 Type 2 diabetes mellitus without complications: Secondary | ICD-10-CM | POA: Diagnosis not present

## 2021-03-27 DIAGNOSIS — I10 Essential (primary) hypertension: Secondary | ICD-10-CM | POA: Diagnosis not present

## 2021-03-27 DIAGNOSIS — Z7984 Long term (current) use of oral hypoglycemic drugs: Secondary | ICD-10-CM | POA: Insufficient documentation

## 2021-03-27 DIAGNOSIS — Z79899 Other long term (current) drug therapy: Secondary | ICD-10-CM | POA: Diagnosis not present

## 2021-03-27 DIAGNOSIS — R519 Headache, unspecified: Secondary | ICD-10-CM | POA: Insufficient documentation

## 2021-03-27 DIAGNOSIS — Z87891 Personal history of nicotine dependence: Secondary | ICD-10-CM | POA: Insufficient documentation

## 2021-03-27 DIAGNOSIS — Z91040 Latex allergy status: Secondary | ICD-10-CM | POA: Insufficient documentation

## 2021-03-27 LAB — CBC WITH DIFFERENTIAL/PLATELET
Abs Immature Granulocytes: 0.02 10*3/uL (ref 0.00–0.07)
Basophils Absolute: 0 10*3/uL (ref 0.0–0.1)
Basophils Relative: 0 %
Eosinophils Absolute: 0.1 10*3/uL (ref 0.0–0.5)
Eosinophils Relative: 1 %
HCT: 42.9 % (ref 36.0–46.0)
Hemoglobin: 14 g/dL (ref 12.0–15.0)
Immature Granulocytes: 0 %
Lymphocytes Relative: 53 %
Lymphs Abs: 4.3 10*3/uL — ABNORMAL HIGH (ref 0.7–4.0)
MCH: 26.9 pg (ref 26.0–34.0)
MCHC: 32.6 g/dL (ref 30.0–36.0)
MCV: 82.3 fL (ref 80.0–100.0)
Monocytes Absolute: 0.5 10*3/uL (ref 0.1–1.0)
Monocytes Relative: 6 %
Neutro Abs: 3.3 10*3/uL (ref 1.7–7.7)
Neutrophils Relative %: 40 %
Platelets: 265 10*3/uL (ref 150–400)
RBC: 5.21 MIL/uL — ABNORMAL HIGH (ref 3.87–5.11)
RDW: 14.3 % (ref 11.5–15.5)
WBC: 8.2 10*3/uL (ref 4.0–10.5)
nRBC: 0 % (ref 0.0–0.2)

## 2021-03-27 LAB — COMPREHENSIVE METABOLIC PANEL
ALT: 34 U/L (ref 0–44)
AST: 29 U/L (ref 15–41)
Albumin: 3.9 g/dL (ref 3.5–5.0)
Alkaline Phosphatase: 66 U/L (ref 38–126)
Anion gap: 10 (ref 5–15)
BUN: 11 mg/dL (ref 6–20)
CO2: 27 mmol/L (ref 22–32)
Calcium: 9.3 mg/dL (ref 8.9–10.3)
Chloride: 103 mmol/L (ref 98–111)
Creatinine, Ser: 0.74 mg/dL (ref 0.44–1.00)
GFR, Estimated: >60 mL/min (ref >60)
Glucose, Bld: 116 mg/dL — ABNORMAL HIGH (ref 70–99)
Potassium: 4 mmol/L (ref 3.5–5.1)
Sodium: 140 mmol/L (ref 135–145)
Total Bilirubin: 0.8 mg/dL (ref 0.3–1.2)
Total Protein: 7.5 g/dL (ref 6.5–8.1)

## 2021-03-27 LAB — URINALYSIS, ROUTINE W REFLEX MICROSCOPIC
Bacteria, UA: NONE SEEN
Bilirubin Urine: NEGATIVE
Glucose, UA: NEGATIVE mg/dL
Ketones, ur: NEGATIVE mg/dL
Leukocytes,Ua: NEGATIVE
Nitrite: NEGATIVE
Protein, ur: NEGATIVE mg/dL
Specific Gravity, Urine: 1.005 (ref 1.005–1.030)
pH: 6 (ref 5.0–8.0)

## 2021-03-27 LAB — CBG MONITORING, ED: Glucose-Capillary: 88 mg/dL (ref 70–99)

## 2021-03-27 MED ORDER — ACETAMINOPHEN 500 MG PO TABS
1000.0000 mg | ORAL_TABLET | Freq: Once | ORAL | Status: AC
  Administered 2021-03-27: 1000 mg via ORAL
  Filled 2021-03-27: qty 2

## 2021-03-27 MED ORDER — PROCHLORPERAZINE EDISYLATE 10 MG/2ML IJ SOLN
10.0000 mg | Freq: Once | INTRAMUSCULAR | Status: AC
  Administered 2021-03-27: 10 mg via INTRAVENOUS
  Filled 2021-03-27: qty 2

## 2021-03-27 MED ORDER — CYCLOBENZAPRINE HCL 10 MG PO TABS
5.0000 mg | ORAL_TABLET | Freq: Once | ORAL | Status: AC
  Administered 2021-03-27: 5 mg via ORAL
  Filled 2021-03-27: qty 1

## 2021-03-27 MED ORDER — DIPHENHYDRAMINE HCL 50 MG/ML IJ SOLN
25.0000 mg | Freq: Once | INTRAMUSCULAR | Status: AC
  Administered 2021-03-27: 25 mg via INTRAVENOUS
  Filled 2021-03-27: qty 1

## 2021-03-27 NOTE — ED Notes (Signed)
Patient transported to CT 
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
## 2021-03-27 NOTE — Discharge Instructions (Addendum)
Incidental findings were noted on your CT imaging.  We noted a hypodense 2.3 cm left thyroid nodule. Recommend further evaluation with nonemergent thyroid ultrasound.  This can be coordinated by your PCP.

## 2021-04-05 ENCOUNTER — Encounter: Payer: Self-pay | Admitting: Family Medicine

## 2021-04-05 ENCOUNTER — Ambulatory Visit: Payer: Medicare Other | Attending: Family Medicine | Admitting: Family Medicine

## 2021-04-05 ENCOUNTER — Other Ambulatory Visit: Payer: Self-pay

## 2021-04-05 ENCOUNTER — Telehealth: Payer: Self-pay

## 2021-04-05 VITALS — BP 123/75 | HR 69 | Ht 62.0 in | Wt 196.6 lb

## 2021-04-05 DIAGNOSIS — Z79899 Other long term (current) drug therapy: Secondary | ICD-10-CM | POA: Diagnosis not present

## 2021-04-05 DIAGNOSIS — Z7984 Long term (current) use of oral hypoglycemic drugs: Secondary | ICD-10-CM | POA: Insufficient documentation

## 2021-04-05 DIAGNOSIS — E1169 Type 2 diabetes mellitus with other specified complication: Secondary | ICD-10-CM | POA: Diagnosis not present

## 2021-04-05 DIAGNOSIS — M549 Dorsalgia, unspecified: Secondary | ICD-10-CM

## 2021-04-05 DIAGNOSIS — I7300 Raynaud's syndrome without gangrene: Secondary | ICD-10-CM | POA: Insufficient documentation

## 2021-04-05 DIAGNOSIS — G932 Benign intracranial hypertension: Secondary | ICD-10-CM | POA: Diagnosis not present

## 2021-04-05 DIAGNOSIS — Z7901 Long term (current) use of anticoagulants: Secondary | ICD-10-CM | POA: Diagnosis not present

## 2021-04-05 DIAGNOSIS — M069 Rheumatoid arthritis, unspecified: Secondary | ICD-10-CM | POA: Diagnosis not present

## 2021-04-05 DIAGNOSIS — Z888 Allergy status to other drugs, medicaments and biological substances status: Secondary | ICD-10-CM | POA: Insufficient documentation

## 2021-04-05 DIAGNOSIS — E041 Nontoxic single thyroid nodule: Secondary | ICD-10-CM

## 2021-04-05 DIAGNOSIS — R3129 Other microscopic hematuria: Secondary | ICD-10-CM | POA: Diagnosis present

## 2021-04-05 DIAGNOSIS — B009 Herpesviral infection, unspecified: Secondary | ICD-10-CM | POA: Insufficient documentation

## 2021-04-05 DIAGNOSIS — Z833 Family history of diabetes mellitus: Secondary | ICD-10-CM | POA: Diagnosis not present

## 2021-04-05 DIAGNOSIS — R519 Headache, unspecified: Secondary | ICD-10-CM

## 2021-04-05 LAB — POCT URINALYSIS DIP (CLINITEK)
Bilirubin, UA: NEGATIVE
Glucose, UA: NEGATIVE mg/dL
Ketones, POC UA: NEGATIVE mg/dL
Leukocytes, UA: NEGATIVE
Nitrite, UA: NEGATIVE
POC PROTEIN,UA: NEGATIVE
Spec Grav, UA: 1.02 (ref 1.010–1.025)
Urobilinogen, UA: 0.2 E.U./dL
pH, UA: 6 (ref 5.0–8.0)

## 2021-04-05 LAB — POCT GLYCOSYLATED HEMOGLOBIN (HGB A1C): HbA1c, POC (controlled diabetic range): 6.1 % (ref 0.0–7.0)

## 2021-04-05 MED ORDER — BUTALBITAL-APAP-CAFFEINE 50-325-40 MG PO TABS: 1.0000 | ORAL_TABLET | Freq: Four times a day (QID) | ORAL | 0 refills | Status: DC | PRN

## 2021-04-05 MED ORDER — TIZANIDINE HCL 4 MG PO TABS: 4.0000 mg | ORAL_TABLET | Freq: Four times a day (QID) | ORAL | 1 refills | Status: DC | PRN

## 2021-04-05 NOTE — Telephone Encounter (Signed)
Can you check on Rheumatology referral patient states that she has dropped of requested records and has not been set an appointment.
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
## 2021-04-05 NOTE — Patient Instructions (Signed)

## 2021-04-07 NOTE — Telephone Encounter (Signed)
Noted  

## 2021-04-10 ENCOUNTER — Ambulatory Visit: Payer: No Typology Code available for payment source

## 2021-04-13 ENCOUNTER — Ambulatory Visit (HOSPITAL_COMMUNITY): Admission: RE | Admit: 2021-04-13 | Payer: Medicare Other | Source: Ambulatory Visit

## 2021-04-14 ENCOUNTER — Ambulatory Visit: Payer: No Typology Code available for payment source

## 2021-04-17 NOTE — Telephone Encounter (Signed)
Pt called saying she does not want to go to [facility].  Can only go somewhere in [place]  CB# [phone]

## 2021-05-03 ENCOUNTER — Telehealth: Payer: Self-pay | Admitting: *Deleted

## 2021-05-03 NOTE — Telephone Encounter (Signed)
Copied from [place] [phone]. Topic: General - Other >> Apr 28, 2021  9:47 AM Leward Quan A wrote: Reason for CRM: Patient called in to inquire of dr Margarita Rana as to why she have not yet been contacted for an ultrasound since she was seen in the hospital and in the office on 04/05/21 but have not heard anything since. Asking for a call back at Ph# [phone] >> Apr 28, 2021  9:54 AM Alanda Slim E wrote: Pt called back and stated you can disregard this message she found the date where she missed the ultrasound appt and is calling to reschedule it/ please advise if needed

## 2021-05-04 ENCOUNTER — Other Ambulatory Visit: Payer: Self-pay | Admitting: Family Medicine

## 2021-05-04 NOTE — Telephone Encounter (Signed)
Appointment has been scheduled for 05/08/2021 by scheduling office.

## 2021-05-05 NOTE — Telephone Encounter (Signed)
Requested medication (s) are due for refill today: Yes  Requested medication (s) are on the active medication list: Yes  Last refill:  04/05/21  Future visit scheduled: Yes  Notes to clinic:  See request.    Requested Prescriptions  Pending Prescriptions Disp Refills   tiZANidine (ZANAFLEX) 4 MG tablet [Pharmacy Med Name: tizanidine 4 mg tablet] 60 tablet 1    Sig: Take 1 tablet (4 mg total) by mouth every 6 (six) hours as needed for muscle spasms.     Not Delegated - Cardiovascular:  Alpha-2 Agonists - tizanidine Failed - 05/04/2021  3:59 PM      Failed - This refill cannot be delegated      Passed - Valid encounter within last 6 months    Recent Outpatient Visits           1 month ago Other microscopic hematuria   [place], [place], MD   2 months ago Type 2 diabetes mellitus with other specified complication, without long-term current use of insulin ([place])   [place], Charlane Ferretti, MD   5 months ago Urinary symptom or sign   [place], MD       Future Appointments             In 6 days Charlott Rakes, MD [place]

## 2021-05-08 ENCOUNTER — Ambulatory Visit (HOSPITAL_COMMUNITY)
Admission: RE | Admit: 2021-05-08 | Discharge: 2021-05-08 | Disposition: A | Discharge status: Home / Self Care | Payer: Medicare Other | Source: Ambulatory Visit | Attending: Family Medicine | Admitting: Family Medicine

## 2021-05-08 ENCOUNTER — Other Ambulatory Visit: Payer: Self-pay

## 2021-05-08 DIAGNOSIS — E041 Nontoxic single thyroid nodule: Secondary | ICD-10-CM | POA: Diagnosis present

## 2021-05-09 ENCOUNTER — Other Ambulatory Visit: Payer: Self-pay | Admitting: Family Medicine

## 2021-05-09 DIAGNOSIS — E041 Nontoxic single thyroid nodule: Secondary | ICD-10-CM

## 2021-05-11 ENCOUNTER — Encounter: Payer: Self-pay | Admitting: Family Medicine

## 2021-05-11 ENCOUNTER — Other Ambulatory Visit: Payer: Self-pay

## 2021-05-11 ENCOUNTER — Ambulatory Visit: Payer: Medicare Other | Attending: Family Medicine | Admitting: Family Medicine

## 2021-05-11 VITALS — BP 119/81 | HR 73 | Ht 62.0 in | Wt 200.2 lb

## 2021-05-11 DIAGNOSIS — E1169 Type 2 diabetes mellitus with other specified complication: Secondary | ICD-10-CM

## 2021-05-11 DIAGNOSIS — M069 Rheumatoid arthritis, unspecified: Secondary | ICD-10-CM

## 2021-05-11 DIAGNOSIS — M797 Fibromyalgia: Secondary | ICD-10-CM

## 2021-05-11 DIAGNOSIS — E041 Nontoxic single thyroid nodule: Secondary | ICD-10-CM | POA: Diagnosis not present

## 2021-05-11 DIAGNOSIS — Z23 Encounter for immunization: Secondary | ICD-10-CM

## 2021-05-11 MED ORDER — METFORMIN HCL 500 MG PO TABS: 500.0000 mg | ORAL_TABLET | Freq: Every day | ORAL | 1 refills | Status: DC

## 2021-05-11 MED ORDER — DULOXETINE HCL 60 MG PO CPEP: 60.0000 mg | ORAL_CAPSULE | Freq: Every day | ORAL | 6 refills | Status: DC

## 2021-05-11 MED ORDER — OZEMPIC (0.25 OR 0.5 MG/DOSE) 2 MG/1.5ML ~~LOC~~ SOPN: 0.5000 mg | PEN_INJECTOR | SUBCUTANEOUS | 6 refills | Status: DC

## 2021-05-11 MED ORDER — PREDNISONE 20 MG PO TABS: 20.0000 mg | ORAL_TABLET | Freq: Every day | ORAL | 0 refills | Status: DC

## 2021-05-11 NOTE — Progress Notes (Signed)
Subjective:  Patient ID: Victoria Holmes, female    DOB: 1981-07-02  Age: 40 y.o. MRN: [id number]  CC: Hypertension   HPI Victoria Holmes is a 40 y.o. year old female with a history of type 2 diabetes mellitus (A1c 6.0) rheumatoid arthritis, lupus, Raynaud's syndrome, Schizoaffective disorder, PTSD, on Valtrex for Herpes Simplex prophylaxis, pseudotumor cerebri.    Interval History: She would like to discuss her thyroid ultrasound results.  I have sent her a message on her MyChart account including a referral to endocrinology for thyroid nodule biopsy but she never reviewed the message.  Thyroid ultrasound 05/08/2021: IMPRESSION: Solitary left-sided thyroid nodule, correlating with the nodule seen on preceding cervical spine CT, meets imaging criteria to recommend percutaneous sampling as indicated.   The above is in keeping with the ACR TI-RADS recommendations - J Am Coll Radiol 2017;14:587-595.    She was referred to Rheumatology, [place] who initially needed her medical records for review but when she brought her medical records they declined to see her. I had also referred her to [place] but she states she has no transportation to get to [facility] as her car might breakdown on the way they are. Previously followed by Dr Macky Lower but wanted to change Rheumatologist.  She is unsure if the pain in her knees, ankles, hips lower back are from Rheumatoid, Raynauds or Fibromyalgia. Her knees are always popping but she has no joint swelling.  She has been stretching her Plaquenil due to not having a rheumatologist.  She is up to date on PAP smear from last year and has been advised to bring in documentation so we can update her chart.  Past Medical History:  Diagnosis Date   Anemia    no current med.   Anxiety    Chronic tonsillitis 03/2012   Depression    Diabetes mellitus    diet-controlled   Fibrocystic breast disease    Headache(784.0)    migraines   History of  endometriosis    Hyperlipidemia    Hypertension    under control, has been on med. x 1 yr.   Lupus (HCC)    Lupus (HCC)    Neuritis    Panic attacks    Raynauds disease    Rheumatoid arthritis(714.0)    Schizoaffective disorder (HCC)    Seizures (HCC)    last seizure > 1 yr. ago   Sinusitis    Sleep disturbance    Thyroid nodule     Past Surgical History:  Procedure Laterality Date   ABDOMINAL HYSTERECTOMY  6 yrs. ago   complete   CHOLECYSTECTOMY  6-8 yrs. ago   FOOT SURGERY  05/2011   right great toe   TONSILLECTOMY  04/22/2012   Procedure: TONSILLECTOMY;  Surgeon: Rozetta Nunnery, MD;  Location: [place];  Service: ENT;  Laterality: N/A;    Family History  Problem Relation Age of Onset   Diabetes Paternal Grandmother    Hypertension Paternal Grandmother    Kidney disease Paternal Grandmother    Heart attack Maternal Grandmother        late 40's   Arthritis Maternal Grandfather    Cancer Maternal Grandfather        lung   Lupus Other     Allergies  Allergen Reactions   Amoxicillin Hives and Rash    Has patient had a PCN reaction causing immediate rash, facial/tongue/throat swelling, SOB or lightheadedness with hypotension: Yes Has patient had a PCN reaction causing severe rash  involving mucus membranes or skin necrosis: Yes Has patient had a PCN reaction that required hospitalization: No Has patient had a PCN reaction occurring within the last 10 years: No If all of the above answers are "NO", then may proceed with Cephalosporin use.    Azithromycin Shortness Of Breath and Rash   Darvocet [Propoxyphene N-Acetaminophen] Shortness Of Breath and Rash   Grapeseed Extract [Nutritional Supplements] Shortness Of Breath, Swelling and Rash    GRAPES   Kiwi Extract Shortness Of Breath, Swelling and Rash   Latex Shortness Of Breath   Macrolides And Ketolides Shortness Of Breath and Rash   Morphine And Related Shortness Of Breath, Rash and Other  (See Comments)    MUSCLE RIGIDITY   Sulfa Antibiotics Shortness Of Breath    MUSCLE RIGIDITY   Klonopin [Clonazepam] Rash and Other (See Comments)    HOMICIDAL THOUGHTS   Penicillins Itching and Swelling    Swelling of mouth Has patient had a PCN reaction causing immediate rash, facial/tongue/throat swelling, SOB or lightheadedness with hypotension: Yes Has patient had a PCN reaction causing severe rash involving mucus membranes or skin necrosis: No Has patient had a PCN reaction that required hospitalization: No Has patient had a PCN reaction occurring within the last 10 years: No If all of the above answers are "NO", then may proceed with Cephalosporin use.    Zoloft [Sertraline Hcl] Other (See Comments)    TARDIVE DYSKINESIA   Contrast Media [Iodinated Diagnostic Agents] Swelling    Pt CAN NOT have drinking contrast; She CAN have IV contrast; when pt drinks water soluble contrast, she develops shallow breathing and hives along with Herpes Type 2 around her eyes   Flagyl [Metronidazole] Nausea And Vomiting   Adhesive [Tape] Rash   Concerta [Methylphenidate] Nausea Only   Lexapro [Escitalopram Oxalate] Diarrhea and Rash   Lortab [Hydrocodone-Acetaminophen] Rash    Outpatient Medications Prior to Visit  Medication Sig Dispense Refill   Accu-Chek Softclix Lancets lancets Use to check blood sugar three times daily. E11.65 100 each 3   amLODipine (NORVASC) 5 MG tablet Take 1 tablet (5 mg total) by mouth daily. 90 tablet 1   atorvastatin (LIPITOR) 20 MG tablet Take 1 tablet (20 mg total) by mouth daily. 90 tablet 2   Blood Glucose Monitoring Suppl (ACCU-CHEK GUIDE ME) w/Device KIT Use to check blood sugar three times daily. E11.65 1 kit 0   butalbital-acetaminophen-caffeine (FIORICET) 50-325-40 MG tablet Take 1-2 tablets by mouth every 6 (six) hours as needed for headache. 20 tablet 0   dapagliflozin propanediol (FARXIGA) 5 MG TABS tablet Take 1 tablet (5 mg total) by mouth daily before  breakfast. 30 tablet 3   diclofenac sodium (VOLTAREN) 1 % GEL Apply 2 g topically 4 (four) times daily as needed (for pain).     EPINEPHrine (EPIPEN JR) 0.15 MG/0.3ML injection Inject 0.15 mg into the muscle as needed.     glucose blood (ACCU-CHEK GUIDE) test strip Use to check blood sugar three times daily. E11.65 100 each 3   hydroxychloroquine (PLAQUENIL) 200 MG tablet Take 200 mg by mouth 2 (two) times daily.     ibuprofen (ADVIL,MOTRIN) 200 MG tablet Take 200 mg by mouth every 6 (six) hours as needed for fever, headache, mild pain, moderate pain or cramping.     pregabalin (LYRICA) 100 MG capsule Take 100 mg by mouth 2 (two) times daily.     propranolol (INDERAL) 20 MG tablet 2 tab(s) orally 2 times a day 30 day(s) 120  tablet 3   tiZANidine (ZANAFLEX) 4 MG tablet Take 1 tablet (4 mg total) by mouth every 6 (six) hours as needed for muscle spasms. 60 tablet 1   valACYclovir (VALTREX) 1000 MG tablet Take 1,000 mg by mouth daily.     metFORMIN (GLUCOPHAGE) 850 MG tablet Take 1 tablet (850 mg total) by mouth daily with breakfast. 90 tablet 1   No facility-administered medications prior to visit.     ROS Review of Systems  Constitutional:  Negative for activity change, appetite change and fatigue.  HENT:  Negative for congestion, sinus pressure and sore throat.   Eyes:  Negative for visual disturbance.  Respiratory:  Negative for cough, chest tightness, shortness of breath and wheezing.   Cardiovascular:  Negative for chest pain and palpitations.  Gastrointestinal:  Negative for abdominal distention, abdominal pain and constipation.  Endocrine: Negative for polydipsia.  Genitourinary:  Negative for dysuria and frequency.  Musculoskeletal:  Negative for arthralgias and back pain.       See HPI  Skin:  Negative for rash.  Neurological:  Negative for tremors, light-headedness and numbness.  Hematological:  Does not bruise/bleed easily.  Psychiatric/Behavioral:  Negative for agitation and  behavioral problems.    Objective:  BP 119/81   Pulse 73   Ht 5' 2"  (1.575 m)   Wt 200 lb 3.2 oz (90.8 kg)   SpO2 99%   BMI 36.62 kg/m   BP/Weight 05/11/2021 2/53/6644 0/09/4740  Systolic BP 595 638 756  Diastolic BP 81 75 98  Wt. (Lbs) 200.2 196.6 -  BMI 36.62 35.96 -      Physical Exam Constitutional:      Appearance: She is well-developed.  Cardiovascular:     Rate and Rhythm: Normal rate.     Heart sounds: Normal heart sounds. No murmur heard. Pulmonary:     Effort: Pulmonary effort is normal.     Breath sounds: Normal breath sounds. No wheezing or rales.  Chest:     Chest wall: No tenderness.  Abdominal:     General: Bowel sounds are normal. There is no distension.     Palpations: Abdomen is soft. There is no mass.     Tenderness: There is no abdominal tenderness.  Musculoskeletal:     Right lower leg: No edema.     Left lower leg: No edema.     Comments: Tenderness on range of motion of knees and hips  Neurological:     Mental Status: She is alert and oriented to person, place, and time.  Psychiatric:        Mood and Affect: Mood normal.    CMP Latest Ref Rng & Units 03/27/2021 11/10/2020 06/06/2020  Glucose 70 - 99 mg/dL 116(H) 110(H) 394(H)  BUN 6 - 20 mg/dL 11 9 10   Creatinine 0.44 - 1.00 mg/dL 0.74 0.74 0.77  Sodium 135 - 145 mmol/L 140 145(H) 137  Potassium 3.5 - 5.1 mmol/L 4.0 4.7 4.7  Chloride 98 - 111 mmol/L 103 104 99  CO2 22 - 32 mmol/L 27 23 28   Calcium 8.9 - 10.3 mg/dL 9.3 9.8 9.6  Total Protein 6.5 - 8.1 g/dL 7.5 7.8 8.5(H)  Total Bilirubin 0.3 - 1.2 mg/dL 0.8 0.3 0.9  Alkaline Phos 38 - 126 U/L 66 92 99  AST 15 - 41 U/L 29 21 41  ALT 0 - 44 U/L 34 30 57(H)    Lipid Panel     Component Value Date/Time   CHOL 189 11/10/2020 0928  TRIG 150 (H) 11/10/2020 0928   HDL 44 11/10/2020 0928   CHOLHDL 4.3 11/10/2020 0928   LDLCALC 118 (H) 11/10/2020 0928    CBC    Component Value Date/Time   WBC 8.2 03/27/2021 2040   RBC 5.21 (H)  03/27/2021 2040   HGB 14.0 03/27/2021 2040   HCT 42.9 03/27/2021 2040   PLT 265 03/27/2021 2040   MCV 82.3 03/27/2021 2040   MCH 26.9 03/27/2021 2040   MCHC 32.6 03/27/2021 2040   RDW 14.3 03/27/2021 2040   LYMPHSABS 4.3 (H) 03/27/2021 2040   MONOABS 0.5 03/27/2021 2040   EOSABS 0.1 03/27/2021 2040   BASOSABS 0.0 03/27/2021 2040    Lab Results  Component Value Date   HGBA1C 6.1 04/05/2021    Assessment & Plan:  1. Type 2 diabetes mellitus with other specified complication, without long-term current use of insulin (HCC) Controlled with A1c of 6.1 Will add on GLP-1 due to weight loss benefit and cardiovascular benefit Decreased dose of metformin from 850 to 500 mg to prevent hypoglycemia Counseled on Diabetic diet, my plate method, 295 minutes of moderate intensity exercise/week Blood sugar logs with fasting goals of 80-120 mg/dl, random of less than 180 and in the event of sugars less than 60 mg/dl or greater than 400 mg/dl encouraged to notify the clinic. Advised on the need for annual eye exams, annual foot exams, Pneumonia vaccine. - metFORMIN (GLUCOPHAGE) 500 MG tablet; Take 1 tablet (500 mg total) by mouth daily with breakfast.  Dispense: 90 tablet; Refill: 1 - Semaglutide,0.25 or 0.5MG/DOS, (OZEMPIC, 0.25 OR 0.5 MG/DOSE,) 2 MG/1.5ML SOPN; Inject 0.5 mg into the skin once a week.  Dispense: 2 mL; Refill: 6  2. Rheumatoid arthritis involving multiple sites, unspecified whether rheumatoid factor present ([place]) Uncontrolled Placed on short course of prednisone She does have Plaquenil but has been stretching this out Will send a message to referral coordinator for what other rheumatology referral options available I have checked with the case manager and Medicaid transportation would not take her out of the county to appointments in [facility] - DULoxetine (CYMBALTA) 60 MG capsule; Take 1 capsule (60 mg total) by mouth daily.  Dispense: 30 capsule; Refill: 6 - Ambulatory  referral to Physical Therapy - predniSONE (DELTASONE) 20 MG tablet; Take 1 tablet (20 mg total) by mouth daily with breakfast.  Dispense: 5 tablet; Refill: 0  3. Thyroid nodule Discussed thyroid ultrasound findings with her Referred to endocrinology to coordinate thyroid nodule biopsy  4. Fibromyalgia Uncontrolled Cymbalta added to regimen Continue tizanidine and Lyrica Physical therapy, water aerobics, yoga will be beneficial.  I have referred her for water aerobics - DULoxetine (CYMBALTA) 60 MG capsule; Take 1 capsule (60 mg total) by mouth daily.  Dispense: 30 capsule; Refill: 6 - Ambulatory referral to Physical Therapy  5. Need for pneumococcal vaccine - Pneumococcal conjugate vaccine 20-valent    Meds ordered this encounter  Medications   metFORMIN (GLUCOPHAGE) 500 MG tablet    Sig: Take 1 tablet (500 mg total) by mouth daily with breakfast.    Dispense:  90 tablet    Refill:  1    Dose change   Semaglutide,0.25 or 0.5MG/DOS, (OZEMPIC, 0.25 OR 0.5 MG/DOSE,) 2 MG/1.5ML SOPN    Sig: Inject 0.5 mg into the skin once a week.    Dispense:  2 mL    Refill:  6   DULoxetine (CYMBALTA) 60 MG capsule    Sig: Take 1 capsule (60 mg total) by mouth daily.  Dispense:  30 capsule    Refill:  6   predniSONE (DELTASONE) 20 MG tablet    Sig: Take 1 tablet (20 mg total) by mouth daily with breakfast.    Dispense:  5 tablet    Refill:  0     Follow-up: Return in about 3 months (around 08/11/2021) for Medical condition.       Charlott Rakes, MD, FAAFP. [facility] and [place], [place]   05/11/2021, 1:56 PM

## 2021-05-11 NOTE — Progress Notes (Signed)
Discuss thyroid US Discuss rheumatology referral.

## 2021-05-17 ENCOUNTER — Other Ambulatory Visit: Payer: Self-pay | Admitting: Family Medicine

## 2021-05-17 NOTE — Telephone Encounter (Signed)
Requested Prescriptions  Pending Prescriptions Disp Refills  . propranolol (INDERAL) 20 MG tablet [Pharmacy Med Name: propranolol 20 mg tablet] 120 tablet 2    Sig: 2 tab(s) orally 2 times a day 30 day(s)     Cardiovascular:  Beta Blockers Passed - 05/17/2021 10:46 AM      Passed - Last BP in normal range    BP Readings from Last 1 Encounters:  05/11/21 119/81         Passed - Last Heart Rate in normal range    Pulse Readings from Last 1 Encounters:  05/11/21 73         Passed - Valid encounter within last 6 months    Recent Outpatient Visits          6 days ago Rheumatoid arthritis involving multiple sites, unspecified whether rheumatoid factor present [facility])   [place], Charlane Ferretti, MD   1 month ago Other microscopic hematuria   [place], Charlane Ferretti, MD   3 months ago Type 2 diabetes mellitus with other specified complication, without long-term current use of insulin ([place])   [place], Charlane Ferretti, MD   6 months ago Urinary symptom or sign   [place], MD      Future Appointments            In 3 months Charlott Rakes, MD [place]

## 2021-05-26 ENCOUNTER — Encounter: Payer: Self-pay | Admitting: Family Medicine

## 2021-05-26 ENCOUNTER — Telehealth: Payer: Self-pay | Admitting: Family Medicine

## 2021-05-26 NOTE — Telephone Encounter (Signed)
Do not see results

## 2021-05-26 NOTE — Telephone Encounter (Signed)
Patient called about being told a biopsy would be done of her thyroid. Please call back for further assistance

## 2021-05-27 NOTE — Telephone Encounter (Signed)
There are no results to be viewed. I referred her to Endocrine for a thyroid biopsy. She sent me a my chart message as well to which I have responded.

## 2021-05-31 ENCOUNTER — Encounter (HOSPITAL_BASED_OUTPATIENT_CLINIC_OR_DEPARTMENT_OTHER): Payer: Self-pay | Admitting: Physical Therapy

## 2021-05-31 ENCOUNTER — Other Ambulatory Visit: Payer: Self-pay

## 2021-05-31 ENCOUNTER — Ambulatory Visit (HOSPITAL_BASED_OUTPATIENT_CLINIC_OR_DEPARTMENT_OTHER): Payer: Medicare Other | Attending: Family Medicine | Admitting: Physical Therapy

## 2021-05-31 DIAGNOSIS — G8929 Other chronic pain: Secondary | ICD-10-CM | POA: Diagnosis present

## 2021-05-31 DIAGNOSIS — M25561 Pain in right knee: Secondary | ICD-10-CM | POA: Diagnosis present

## 2021-05-31 DIAGNOSIS — R2689 Other abnormalities of gait and mobility: Secondary | ICD-10-CM | POA: Diagnosis present

## 2021-05-31 DIAGNOSIS — R2681 Unsteadiness on feet: Secondary | ICD-10-CM | POA: Diagnosis present

## 2021-05-31 DIAGNOSIS — R262 Difficulty in walking, not elsewhere classified: Secondary | ICD-10-CM | POA: Diagnosis present

## 2021-05-31 DIAGNOSIS — M6281 Muscle weakness (generalized): Secondary | ICD-10-CM | POA: Insufficient documentation

## 2021-05-31 DIAGNOSIS — M069 Rheumatoid arthritis, unspecified: Secondary | ICD-10-CM | POA: Diagnosis not present

## 2021-05-31 DIAGNOSIS — M25562 Pain in left knee: Secondary | ICD-10-CM | POA: Insufficient documentation

## 2021-05-31 DIAGNOSIS — M797 Fibromyalgia: Secondary | ICD-10-CM | POA: Insufficient documentation

## 2021-05-31 DIAGNOSIS — M25552 Pain in left hip: Secondary | ICD-10-CM | POA: Insufficient documentation

## 2021-05-31 DIAGNOSIS — M25551 Pain in right hip: Secondary | ICD-10-CM | POA: Diagnosis present

## 2021-05-31 DIAGNOSIS — R293 Abnormal posture: Secondary | ICD-10-CM | POA: Diagnosis present

## 2021-05-31 DIAGNOSIS — M545 Low back pain, unspecified: Secondary | ICD-10-CM | POA: Diagnosis present

## 2021-05-31 NOTE — Therapy (Signed)
OUTPATIENT PHYSICAL THERAPY THORACOLUMBAR EVALUATION   Patient Name: Victoria Holmes MRN: [id number] DOB:27-Dec-1980, 40 y.o., female Today's Date: 05/31/2021   PT End of Session - 05/31/21 1259     Visit Number 1    Number of Visits 17    Date for PT Re-Evaluation 08/29/21    Authorization Type Medicare A and B    PT Start Time 1100    PT Stop Time 1145    PT Time Calculation (min) 45 min    Activity Tolerance Patient tolerated treatment well;Patient limited by pain    Behavior During Therapy WFL for tasks assessed/performed             Past Medical History:  Diagnosis Date   Anemia    no current med.   Anxiety    Chronic tonsillitis 03/2012   Depression    Diabetes mellitus    diet-controlled   Fibrocystic breast disease    Headache(784.0)    migraines   History of endometriosis    Hyperlipidemia    Hypertension    under control, has been on med. x 1 yr.   Lupus (HCC)    Lupus (HCC)    Neuritis    Panic attacks    Raynauds disease    Rheumatoid arthritis(714.0)    Schizoaffective disorder (HCC)    Seizures (HCC)    last seizure > 1 yr. ago   Sinusitis    Sleep disturbance    Thyroid nodule    Past Surgical History:  Procedure Laterality Date   ABDOMINAL HYSTERECTOMY  6 yrs. ago   complete   CHOLECYSTECTOMY  6-8 yrs. ago   FOOT SURGERY  05/2011   right great toe   TONSILLECTOMY  04/22/2012   Procedure: TONSILLECTOMY;  Surgeon: Rozetta Nunnery, MD;  Location: [facility];  Service: ENT;  Laterality: N/A;   Patient Active Problem List   Diagnosis Date Noted   Controlled diabetes mellitus type 2 with complications ([place]) 76/73/4193   Rheumatoid arthritis ([place])    Raynauds disease    Precordial pain 05/18/2020   Essential hypertension 04/13/2020   Palpitations 04/13/2020   Carpopedal spasm 02/01/2015   Epilepsy ([place]) 11/24/2014   Tobacco use disorder 07/17/2014   Drug allergy, multiple 07/17/2014    PCP: Charlott Rakes,  MD  REFERRING PROVIDER: Charlott Rakes, MD  REFERRING DIAG: M06.9 (ICD-10-CM) - Rheumatoid arthritis involving multiple sites, unspecified whether rheumatoid factor present ([place]) M79.7 (ICD-10-CM) - Fibromyalgia   THERAPY DIAG:  Bilateral hip pain - Plan: PT plan of care cert/re-cert  Chronic pain of both knees - Plan: PT plan of care cert/re-cert  Pain, lumbar region - Plan: PT plan of care cert/re-cert  Muscle weakness (generalized) - Plan: PT plan of care cert/re-cert  Difficulty walking - Plan: PT plan of care cert/re-cert  ONSET DATE: 7902  SUBJECTIVE:  SUBJECTIVE STATEMENT: Pt states she has had pain for years at this point. Her hip and knee pain feels like "bone on bone" pain. She states they feel stiff and "without cartilage." Pt reports diagnosis of seizures was due to a mold exposure multiple years ago while working. She has not had a seizure recently (>3 years). She is unsure if the pain in her knees, ankles, hips lower back are from Rheumatoid, Raynauds or Fibromyalgia. She has had pain for multiple years. She states she constantly feels tight and she needs to stretch. She states that squatting down will hurt when she tried to open them up. She states the pain is sensitive to touch at the skin levels. 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 She states she has tried chiro treatments before and it has not lasted. She states the treatment will only last for a few hours. Pt states that going upstairs is much more aggravating. The pain will come out of nowhere. She states that the legs/knees will feel like they give out and she is not able to hold herself up. They never give out at the same time. Has happened 3 times last year. Pt does endorse NT into bilat feet as  well as arms. Eventually does subside. Pt denies systemic sx.   PERTINENT HISTORY:  Lupus, HTN, Fibromyalgia, Depression, DM2, RA, anxiety, schizoaffective disorder, seizures (last time had happened over 3-4)  PAIN:  Are you having pain? Yes VAS scale: 6/10 Pain location: anterior knees Pain orientation: Bilateral  Pain description: burning, dull, and stabbing  Aggravating factors: no pattern, sitting still for too long, standing for too long, lifting, walking, laying flat, stairs Relieving factors: ice/heat  PRECAUTIONS: history of seizures  FALLS:  Has patient fallen in last 6 months? No, Number of falls: 0  LIVING ENVIRONMENT: Lives with: lives alone Lives in: House/apartment Stairs: Yes; 3rd floor Has following equipment at home: None  OCCUPATION: not currently; due to pandemic, she stays at home most days. She is walking less that 6k steps a day. She will sometimes do some 5lb DB workouts but is not consistent.   PLOF: Independent  PATIENT GOALS : Pt states she would like to reduce pain, improve joint stiffness, and improve her mobility.    OBJECTIVE:   DIAGNOSTIC FINDINGS:  IMPRESSION: 1. No acute fracture or traumatic listhesis of the cervical spine. 2. Hypodense 2.3 cm left thyroid nodule. Recommend further evaluation with nonemergent thyroid US (ref: J Am Coll Radiol. 2015 Feb;12(2): 143-50).  IMPRESSION: No evidence of acute intracranial abnormality.   Redemonstrated partially empty sella turcica. This finding is very commonly incidental, but can be associated with idiopathic intracranial hypertension.   Left sphenoid and right ethmoid sinusitis.   Trace left mastoid effusion.  PATIENT SURVEYS:  FOTO 47  54 at DC MCII 5 pts  SCREENING FOR RED FLAGS: Bowel or bladder incontinence: No Spinal tumors: No Cauda equina syndrome: No Compression fracture: No   COGNITION:  Overall cognitive status: Within functional limits for tasks  assessed     SENSATION:  Light touch: Deficits paresthesia in bilat UE and LE    POSTURE:  Exaggerated lumbar lordosis, toe out,   LUMBARAROM/PROM  A/PROM A/PROM  05/31/2021  Flexion 50% p!  Extension 10% p!  Right lateral flexion 50%  Left lateral flexion 60%  Right rotation 70%  Left rotation 70%   (Blank rows = not tested)  LE AROM/PROM:  A/PROM Right 05/31/2021 Left  05/31/2021  Hip flexion 110 100  Hip extension 0 0  Hip abduction    Hip adduction    Hip internal rotation 15 15  Hip external rotation 30 30  Knee flexion 120 120  Knee extension [facility] WFL  Ankle dorsiflexion    Ankle plantarflexion    Ankle inversion    Ankle eversion     (Blank rows = not tested)    LUMBAR SPECIAL TESTS:  Straight leg raise test: Negative, Slump test: Negative, and Trendelenburg sign: Positive  PALPATION ASSESSMENT:  TTP bilat paraspinals, especially L> R QL, bilat knee joint line  FUNCTIONAL TESTS:  5 times sit to stand: unable to perform without UE, stiff/upright trunk position, decreased hip hinge and extension Stairs: pain with ascend and descend, decreased eccentric lowering control, reciprocal pattern without UE  GAIT: Distance walked: 30 Assistive device utilized: None Level of assistance: Complete Independence Comments: bilat Trendelenberg, exaggerated trunk rotation, increased hip hike during swing    TODAY'S TREATMENT  Exercises Supine Lower Trunk Rotation - 2 x daily - 7 x weekly - 2 sets - 10 reps - 3 hold Supine Posterior Pelvic Tilt - 2 x daily - 7 x weekly - 2 sets - 10 reps Seated Long Arc Quad - 2 x daily - 7 x weekly - 2 sets - 10 reps Supine Bridge - 2 x daily - 7 x weekly - 2 sets - 10 reps   PATIENT EDUCATION:  Education details: diagnosis, prognosis, anatomy, exercise progression, DOMS expectations, muscle firing,  envelope of function, HEP, POC  Person educated: Patient Education method: Explanation, Demonstration, Tactile cues, Verbal cues,  and Handouts Education comprehension: verbalized understanding, returned demonstration, verbal cues required, and tactile cues required   HOME EXERCISE PROGRAM: Access Code: [email or web address] URL: [email or web address] Date: 05/31/2021 Prepared by: Daleen Bo    ASSESSMENT:  CLINICAL IMPRESSION: Patient is a 40 y.o. female who was seen today for physical therapy evaluation and treatment for fibromyalgia, bilat knee pain, and generalized LBP. Pt's s/s appear consistent with mechanical joint stiffness and soft tissue restriction likely due to sedentary lifestyle. Pt's PMH of systemic processes are also large contributing factors to pain- in addition her resting level of anxiety/depression about her current physical condition. Objective impairments include Abnormal gait, decreased activity tolerance, decreased balance, decreased endurance, decreased knowledge of condition, decreased mobility, difficulty walking, decreased ROM, decreased strength, hypomobility, increased muscle spasms, impaired flexibility, improper body mechanics, postural dysfunction, obesity, and pain. These impairments are limiting patient from cleaning, community activity, driving, yard work, and shopping. Personal factors including Age, Fitness, Past/current experiences, Time since onset of injury/illness/exacerbation, and 3+ comorbidities:    are also affecting patient's functional outcome. Patient will benefit from skilled PT to address above impairments and improve overall function.  REHAB POTENTIAL: Fair    CLINICAL DECISION MAKING: Evolving/moderate complexity  EVALUATION COMPLEXITY: Moderate   GOALS:   SHORT TERM GOALS:  STG Name Target Date Goal status  1 Pt will become independent with HEP in order to demonstrate synthesis of PT education.   06/14/2021 INITIAL  2 Pt will report at least 2 pt reduction on NPRS scale for pain in order to demonstrate functional improvement with household activity, self  care, and ADL.    06/28/2021 INITIAL  3 Pt will be able to demonstrate ability to perform hip hinge and STS without UE in order to demonstrate functional improvement in lumbar/LE function for daily mobility and house hold duties.  06/28/2021 INITIAL  4 Pt will score at  least 5 pt increase on FOTO to demonstrate functional improvement in MCII and pt perceived function.   06/28/2021 INITIAL   LONG TERM GOALS:   LTG Name Target Date Goal status  1 Pt  will become independent with final HEP in order to demonstrate synthesis of PT education.  07/26/2021 INITIAL  2 Pt will be able to lift/squat/hold >20 lbs in order to demonstrate functional improvement in lumbopelvic strength for return to exercise and regular household activity.  07/26/2021 INITIAL  3 Pt will be able to demonstrate/report ability to sit/stand/sleep for extended periods of time without pain in order to demonstrate functional improvement and tolerance to static positioning.  07/26/2021 INITIAL  4 Pt will be able to demonstrate/report ability to walk >15 mins without pain in order to demonstrate functional improvement and tolerance to exercise and community mobility.  07/26/2021 INITIAL   PLAN: PT FREQUENCY: 2x/week  PT DURATION: 8 weeks  PLANNED INTERVENTIONS: Therapeutic exercises, Therapeutic activity, Neuro Muscular re-education, Balance training, Gait training, Patient/Family education, Joint mobilization, Stair training, Aquatic Therapy, Dry Needling, Electrical stimulation, Spinal mobilization, Cryotherapy, Moist heat, scar mobilization, Taping, Vasopneumatic device, Traction, Ultrasound, Ionotophoresis 4mg /ml Dexamethasone, and Manual therapy  PLAN FOR NEXT SESSION: intro to [employer] PT, DPT 05/31/21 1:00 PM

## 2021-06-06 ENCOUNTER — Ambulatory Visit (HOSPITAL_BASED_OUTPATIENT_CLINIC_OR_DEPARTMENT_OTHER): Payer: Medicare Other | Admitting: Physical Therapy

## 2021-06-13 ENCOUNTER — Encounter (HOSPITAL_BASED_OUTPATIENT_CLINIC_OR_DEPARTMENT_OTHER): Payer: Self-pay | Admitting: Physical Therapy

## 2021-06-13 ENCOUNTER — Ambulatory Visit (HOSPITAL_BASED_OUTPATIENT_CLINIC_OR_DEPARTMENT_OTHER): Payer: Medicare Other | Admitting: Physical Therapy

## 2021-06-13 ENCOUNTER — Other Ambulatory Visit: Payer: Self-pay

## 2021-06-13 DIAGNOSIS — G8929 Other chronic pain: Secondary | ICD-10-CM

## 2021-06-13 DIAGNOSIS — R293 Abnormal posture: Secondary | ICD-10-CM

## 2021-06-13 DIAGNOSIS — M6281 Muscle weakness (generalized): Secondary | ICD-10-CM

## 2021-06-13 DIAGNOSIS — R262 Difficulty in walking, not elsewhere classified: Secondary | ICD-10-CM

## 2021-06-13 DIAGNOSIS — R2681 Unsteadiness on feet: Secondary | ICD-10-CM

## 2021-06-13 DIAGNOSIS — M25551 Pain in right hip: Secondary | ICD-10-CM | POA: Diagnosis not present

## 2021-06-13 DIAGNOSIS — R2689 Other abnormalities of gait and mobility: Secondary | ICD-10-CM

## 2021-06-13 NOTE — Therapy (Signed)
OUTPATIENT PHYSICAL THERAPY TREATMENT NOTE   Patient Name: Victoria Holmes MRN: [id number] DOB:03/12/1981, 40 y.o., female Today's Date: 06/13/2021  PCP: Charlott Rakes, MD REFERRING PROVIDER: Charlott Rakes, MD   PT End of Session - 06/13/21 1449     Visit Number 2    Number of Visits 17    Date for PT Re-Evaluation 08/29/21    Authorization Type Medicare A and B    PT Start Time 1410    PT Stop Time 1448    PT Time Calculation (min) 38 min    Activity Tolerance Patient tolerated treatment well;Patient limited by pain    Behavior During Therapy WFL for tasks assessed/performed             Past Medical History:  Diagnosis Date   Anemia    no current med.   Anxiety    Chronic tonsillitis 03/2012   Depression    Diabetes mellitus    diet-controlled   Fibrocystic breast disease    Headache(784.0)    migraines   History of endometriosis    Hyperlipidemia    Hypertension    under control, has been on med. x 1 yr.   Lupus (HCC)    Lupus (HCC)    Neuritis    Panic attacks    Raynauds disease    Rheumatoid arthritis(714.0)    Schizoaffective disorder (HCC)    Seizures (HCC)    last seizure > 1 yr. ago   Sinusitis    Sleep disturbance    Thyroid nodule    Past Surgical History:  Procedure Laterality Date   ABDOMINAL HYSTERECTOMY  6 yrs. ago   complete   CHOLECYSTECTOMY  6-8 yrs. ago   FOOT SURGERY  05/2011   right great toe   TONSILLECTOMY  04/22/2012   Procedure: TONSILLECTOMY;  Surgeon: Rozetta Nunnery, MD;  Location: [facility];  Service: ENT;  Laterality: N/A;   Patient Active Problem List   Diagnosis Date Noted   Controlled diabetes mellitus type 2 with complications ([place]) 40/98/1191   Rheumatoid arthritis ([place])    Raynauds disease    Precordial pain 05/18/2020   Essential hypertension 04/13/2020   Palpitations 04/13/2020   Carpopedal spasm 02/01/2015   Epilepsy ([place]) 11/24/2014   Tobacco use disorder 07/17/2014   Drug  allergy, multiple 07/17/2014    REFERRING DIAG: M06.9 (ICD-10-CM) - Rheumatoid arthritis involving multiple sites, unspecified whether rheumatoid factor present ([place]) M79.7 (ICD-10-CM) - Fibromyalgia   THERAPY DIAG:  Abnormal posture  Chronic pain of right knee  Difficulty in walking, not elsewhere classified  Muscle weakness (generalized)  Unsteadiness on feet  Other abnormalities of gait and mobility  PERTINENT HISTORY: Lupus, HTN, Fibromyalgia, Depression, DM2, RA, anxiety, schizoaffective disorder, seizures (last time had happened over 3-4)  PRECAUTIONS: history of seizures  SUBJECTIVE: My hips aren't hurting today; Its a good day today; Pain is bearable"  OBJECTIVE:     PAIN:  Are you having pain? Yes VAS scale: 6/10 Pain location: anterior knees Pain orientation: Bilateral  Pain description: burning, dull, and stabbing  Aggravating factors: no pattern, sitting still for too long, standing for too long, lifting, walking, laying flat, stairs Relieving factors: ice/heat       HOME EXERCISE PROGRAM: Access Code: [email or web address] URL: [email or web address] Date: 05/31/2021 Prepared by: Daleen Bo   PAIN:  Are you having pain? Yes VAS scale: 3/10 11/22 Pain location: knees Pain orientation: Right and Left  PAIN TYPE: aching Pain description: intermittent  Aggravating factors: no pattern,  sitting still for too long, standing for too long, lifting, walking, laying flat, stairs Relieving factors: ice/heat    ASSESSMENT:   CLINICAL IMPRESSION: Pt reporting "good day" pain manageable.  She is introduced to setting. Demonstrates some apprehension upon entering although less as session continues.  Pt instructed through various stretching and strengthening exercises with reports of slight knee discomfort, mostly in shallow water (68ft).  Pt edu on importance of core/proximal strength for distal control. She is a good candidate for aquatic rehab to improve strength  with decreased pain improving overall functional mobility and safety.     Objective impairments include Abnormal gait, decreased activity tolerance, decreased balance, decreased endurance, decreased knowledge of condition, decreased mobility, difficulty walking, decreased ROM, decreased strength, hypomobility, increased muscle spasms, impaired flexibility, improper body mechanics, postural dysfunction, obesity, and pain. These impairments are limiting patient from cleaning, community activity, driving, yard work, and shopping. Personal factors including Age, Fitness, Past/current experiences, Time since onset of injury/illness/exacerbation, and 3+ comorbidities:    are also affecting patient's functional outcome. Patient will benefit from skilled PT to address above impairments and improve overall function.   REHAB POTENTIAL: Fair     CLINICAL DECISION MAKING: Evolving/moderate complexity   EVALUATION COMPLEXITY: Moderate     GOALS:     SHORT TERM GOALS:   STG Name Target Date Goal status  1 Pt will become independent with HEP in order to demonstrate synthesis of PT education.   06/14/2021 INITIAL  2 Pt will report at least 2 pt reduction on NPRS scale for pain in order to demonstrate functional improvement with household activity, self care, and ADL.    06/28/2021 INITIAL  3 Pt will be able to demonstrate ability to perform hip hinge and STS without UE in order to demonstrate functional improvement in lumbar/LE function for daily mobility and house hold duties.   06/28/2021 INITIAL  4 Pt will score at least 5 pt increase on FOTO to demonstrate functional improvement in MCII and pt perceived function.    06/28/2021 INITIAL    LONG TERM GOALS:    LTG Name Target Date Goal status  1 Pt  will become independent with final HEP in order to demonstrate synthesis of PT education.   07/26/2021 INITIAL  2 Pt will be able to lift/squat/hold >20 lbs in order to demonstrate functional improvement in  lumbopelvic strength for return to exercise and regular household activity.  07/26/2021 INITIAL  3 Pt will be able to demonstrate/report ability to sit/stand/sleep for extended periods of time without pain in order to demonstrate functional improvement and tolerance to static positioning.   07/26/2021 INITIAL  4 Pt will be able to demonstrate/report ability to walk >15 mins without pain in order to demonstrate functional improvement and tolerance to exercise and community mobility.   07/26/2021 INITIAL    PLAN: PT FREQUENCY: 2x/week   PT DURATION: 8 weeks   PLANNED INTERVENTIONS: Therapeutic exercises, Therapeutic activity, Neuro Muscular re-education, Balance training, Gait training, Patient/Family education, Joint mobilization, Stair training, Aquatic Therapy, Dry Needling, Electrical stimulation, Spinal mobilization, Cryotherapy, Moist heat, scar mobilization, Taping, Vasopneumatic device, Traction, Ultrasound, Ionotophoresis 4mg /ml Dexamethasone, and Manual therapy       TODAY'S TREATMENT: 11/22 Pt seen for aquatic therapy today.  Treatment took place in water 3.25-4.8 ft in depth at the [facility] pool. Temp of water was 91.  Pt entered/exited the pool via stairs (step through pattern) independently with bilat rail.  Pt entered water for aquatic therapy for first time and  was introduced to principles and therapeutic effects of water as she ambulated and acclimated to pool.  Warm up: forward, backward and side stepping multiple widths of pool supported by yellow noodle with supervision  Seated stretching: gastroc, adductors and hamstring 3 x 30 sec hold Seated exercises: STS from 3rd water bench.  Cues for execution; hand placement and weight shifting  Standing exercises Gastroc stretch on bottom step Step ups initiated in 3 ft, moved to submerged 80% due to slight knee pain, x12 R/L forward and sidestepping. Holding to wall then progressed to unsupported.  Cues for tightened  core.   Squats on bottom water step verbal cues and demonstration for weight bearing through heels for quad, hamstring and glut engagement  Walking holding hand buoys submerged to sides x 6 widths for core engagement. Pt requires buoyancy for support and to offload joints with strengthening exercises. Viscosity of the water is needed for resistance of strengthening; water current perturbations provides challenge to standing balance unsupported, requiring increased core activation.    PLAN FOR NEXT SESSION: toleration of last session; add/modify le knee exercises, core and proximal strengthening  PATIENT EDUCATION:  Properties of water; benefits of aquatic therapy/advantages.     Jama Mcmiller Tharon Aquas) Chanoch Mccleery MPT  06/13/2021, 3:52 PM

## 2021-06-22 ENCOUNTER — Ambulatory Visit (HOSPITAL_BASED_OUTPATIENT_CLINIC_OR_DEPARTMENT_OTHER): Payer: Medicare Other | Admitting: Physical Therapy

## 2021-06-28 ENCOUNTER — Other Ambulatory Visit: Payer: Self-pay

## 2021-06-28 ENCOUNTER — Encounter (HOSPITAL_BASED_OUTPATIENT_CLINIC_OR_DEPARTMENT_OTHER): Payer: Self-pay | Admitting: Physical Therapy

## 2021-06-28 ENCOUNTER — Ambulatory Visit (HOSPITAL_BASED_OUTPATIENT_CLINIC_OR_DEPARTMENT_OTHER): Payer: Medicare Other | Attending: Family Medicine | Admitting: Physical Therapy

## 2021-06-28 DIAGNOSIS — R2681 Unsteadiness on feet: Secondary | ICD-10-CM | POA: Diagnosis present

## 2021-06-28 DIAGNOSIS — R262 Difficulty in walking, not elsewhere classified: Secondary | ICD-10-CM | POA: Insufficient documentation

## 2021-06-28 DIAGNOSIS — R293 Abnormal posture: Secondary | ICD-10-CM | POA: Insufficient documentation

## 2021-06-28 DIAGNOSIS — G8929 Other chronic pain: Secondary | ICD-10-CM | POA: Diagnosis present

## 2021-06-28 DIAGNOSIS — M25561 Pain in right knee: Secondary | ICD-10-CM | POA: Insufficient documentation

## 2021-06-28 DIAGNOSIS — M6281 Muscle weakness (generalized): Secondary | ICD-10-CM | POA: Insufficient documentation

## 2021-06-28 NOTE — Therapy (Signed)
OUTPATIENT PHYSICAL THERAPY TREATMENT NOTE   Patient Name: Victoria Holmes MRN: [id number] DOB:[birth year], 40 y.o., female Today's Date: 06/28/2021  PCP: Charlott Rakes, MD REFERRING PROVIDER: Charlott Rakes, MD   PT End of Session - 06/28/21 1614     Visit Number 3    Number of Visits 17    Date for PT Re-Evaluation 08/29/21    Authorization Type Medicare A and B    PT Start Time 1510    PT Stop Time 4163    PT Time Calculation (min) 38 min              Past Medical History:  Diagnosis Date   Anemia    no current med.   Anxiety    Chronic tonsillitis 03/2012   Depression    Diabetes mellitus    diet-controlled   Fibrocystic breast disease    Headache(784.0)    migraines   History of endometriosis    Hyperlipidemia    Hypertension    under control, has been on med. x 1 yr.   Lupus (HCC)    Lupus (HCC)    Neuritis    Panic attacks    Raynauds disease    Rheumatoid arthritis(714.0)    Schizoaffective disorder (HCC)    Seizures (HCC)    last seizure > 1 yr. ago   Sinusitis    Sleep disturbance    Thyroid nodule    Past Surgical History:  Procedure Laterality Date   ABDOMINAL HYSTERECTOMY  6 yrs. ago   complete   CHOLECYSTECTOMY  6-8 yrs. ago   FOOT SURGERY  05/2011   right great toe   TONSILLECTOMY  04/22/2012   Procedure: TONSILLECTOMY;  Surgeon: Rozetta Nunnery, MD;  Location: [facility];  Service: ENT;  Laterality: N/A;   Patient Active Problem List   Diagnosis Date Noted   Controlled diabetes mellitus type 2 with complications ([place]) 84/53/6468   Rheumatoid arthritis ([place])    Raynauds disease    Precordial pain 05/18/2020   Essential hypertension 04/13/2020   Palpitations 04/13/2020   Carpopedal spasm 02/01/2015   Epilepsy ([place]) 11/24/2014   Tobacco use disorder 07/17/2014   Drug allergy, multiple 07/17/2014    REFERRING DIAG: M06.9 (ICD-10-CM) - Rheumatoid arthritis involving multiple sites, unspecified whether  rheumatoid factor present ([place]) M79.7 (ICD-10-CM) - Fibromyalgia   THERAPY DIAG:  Abnormal posture  Chronic pain of right knee  Difficulty in walking, not elsewhere classified  Muscle weakness (generalized)  Unsteadiness on feet  PERTINENT HISTORY: Lupus, HTN, Fibromyalgia, Depression, DM2, RA, anxiety, schizoaffective disorder, seizures (last time had happened over 3-4)  PRECAUTIONS: history of seizures  SUBJECTIVE: "Bad day, everything hurting and I have a mograine  OBJECTIVE:     PAIN:  Are you having pain? Yes VAS scale: 6/10 Pain location: anterior knees Pain orientation: Bilateral  Pain description: burning, dull, and stabbing  Aggravating factors: no pattern, sitting still for too long, standing for too long, lifting, walking, laying flat, stairs Relieving factors: ice/heat       HOME EXERCISE PROGRAM: Access Code: [email or web address] URL: [email or web address] Date: 05/31/2021 Prepared by: Daleen Bo   PAIN:  Are you having pain? Yes VAS scale: 3/10 11/22 Pain location: knees Pain orientation: Right and Left  PAIN TYPE: aching Pain description: intermittent  Aggravating factors: no pattern, sitting still for too long, standing for too long, lifting, walking, laying flat, stairs Relieving factors: ice/heat    ASSESSMENT:   CLINICAL IMPRESSION: Pt presents with general pain along with  headache. She didn't want to miss therapy.  Pt completes exercises fair. Bad Ragaz technique used for strengthening of core as well as relaxation through stretching and repetitive movement in water. Pt states she was very uncomfortable after last session in that evening but the following day felt as though it has helped. She had cancelled last visit.     Objective impairments include Abnormal gait, decreased activity tolerance, decreased balance, decreased endurance, decreased knowledge of condition, decreased mobility, difficulty walking, decreased ROM, decreased  strength, hypomobility, increased muscle spasms, impaired flexibility, improper body mechanics, postural dysfunction, obesity, and pain. These impairments are limiting patient from cleaning, community activity, driving, yard work, and shopping. Personal factors including Age, Fitness, Past/current experiences, Time since onset of injury/illness/exacerbation, and 3+ comorbidities:    are also affecting patient's functional outcome. Patient will benefit from skilled PT to address above impairments and improve overall function.   REHAB POTENTIAL: Fair     CLINICAL DECISION MAKING: Evolving/moderate complexity   EVALUATION COMPLEXITY: Moderate     GOALS:     SHORT TERM GOALS:   STG Name Target Date Goal status  1 Pt will become independent with HEP in order to demonstrate synthesis of PT education.   06/14/2021 INITIAL  2 Pt will report at least 2 pt reduction on NPRS scale for pain in order to demonstrate functional improvement with household activity, self care, and ADL.    06/28/2021 INITIAL  3 Pt will be able to demonstrate ability to perform hip hinge and STS without UE in order to demonstrate functional improvement in lumbar/LE function for daily mobility and house hold duties.   06/28/2021 INITIAL  4 Pt will score at least 5 pt increase on FOTO to demonstrate functional improvement in MCII and pt perceived function.    06/28/2021 INITIAL    LONG TERM GOALS:    LTG Name Target Date Goal status  1 Pt  will become independent with final HEP in order to demonstrate synthesis of PT education.   07/26/2021 INITIAL  2 Pt will be able to lift/squat/hold >20 lbs in order to demonstrate functional improvement in lumbopelvic strength for return to exercise and regular household activity.  07/26/2021 INITIAL  3 Pt will be able to demonstrate/report ability to sit/stand/sleep for extended periods of time without pain in order to demonstrate functional improvement and tolerance to static positioning.    07/26/2021 INITIAL  4 Pt will be able to demonstrate/report ability to walk >15 mins without pain in order to demonstrate functional improvement and tolerance to exercise and community mobility.   07/26/2021 INITIAL    PLAN: PT FREQUENCY: 2x/week   PT DURATION: 8 weeks   PLANNED INTERVENTIONS: Therapeutic exercises, Therapeutic activity, Neuro Muscular re-education, Balance training, Gait training, Patient/Family education, Joint mobilization, Stair training, Aquatic Therapy, Dry Needling, Electrical stimulation, Spinal mobilization, Cryotherapy, Moist heat, scar mobilization, Taping, Vasopneumatic device, Traction, Ultrasound, Ionotophoresis 4mg /ml Dexamethasone, and Manual therapy       TODAY'S TREATMENT: 12/7 Pt seen for aquatic therapy today.  Treatment took place in water 3.25-4.8 ft in depth at the [facility] pool. Temp of water was 91.  Pt entered/exited the pool via stairs (step through pattern) independently with bilat rail.  Walking fwd,retro and side x 8 widths warmup  Bad Ragaz, Pt with lumbar belt around hips and nek doodle for neck support, ankle buoys and noodles. Pt assisted into supine floating position by PT . PT at torso and assisting with trunk left to right and vice versa to  engage trunk muscles. PT then rotated trunk in order to engage abdomnal (internal and external obliques).   -posterior core strengthening: hip extension 2x5; knee flex 2x5 -knee flex/extension therapist securing feet (supine squat) x 10 -LB stretching in above position/knees to chest with slight overpressure by therapist.   Pt requires buoyancy for support and to offload joints with strengthening exercises. Viscosity of the water is needed for resistance of strengthening; water current perturbations provides challenge to standing balance unsupported, requiring increased core activation.   11/22 Pt seen for aquatic therapy today.  Treatment took place in water 3.25-4.8 ft in depth at the  [facility] pool. Temp of water was 91.  Pt entered/exited the pool via stairs (step through pattern) independently with bilat rail.  Pt entered water for aquatic therapy for first time and was introduced to principles and therapeutic effects of water as she ambulated and acclimated to pool.  Warm up: forward, backward and side stepping multiple widths of pool supported by yellow noodle with supervision  Seated stretching: gastroc, adductors and hamstring 3 x 30 sec hold Seated exercises: STS from 3rd water bench.  Cues for execution; hand placement and weight shifting  Standing exercises Gastroc stretch on bottom step Step ups initiated in 3 ft, moved to submerged 80% due to slight knee pain, x12 R/L forward and sidestepping. Holding to wall then progressed to unsupported.  Cues for tightened core.   Squats on bottom water step verbal cues and demonstration for weight bearing through heels for quad, hamstring and glut engagement  Walking holding hand buoys submerged to sides x 6 widths for core engagement. Pt requires buoyancy for support and to offload joints with strengthening exercises. Viscosity of the water is needed for resistance of strengthening; water current perturbations provides challenge to standing balance unsupported, requiring increased core activation.    PLAN FOR NEXT SESSION: toleration of last session; add/modify le knee exercises, core and proximal strengthening  PATIENT EDUCATION:  Properties of water; benefits of aquatic therapy/advantages.     Stanton Kidney Tharon Aquas) Trenden Hazelrigg MPT  06/28/2021, 6:35 PM

## 2021-07-03 ENCOUNTER — Encounter (HOSPITAL_BASED_OUTPATIENT_CLINIC_OR_DEPARTMENT_OTHER): Payer: Self-pay

## 2021-07-03 ENCOUNTER — Ambulatory Visit (HOSPITAL_BASED_OUTPATIENT_CLINIC_OR_DEPARTMENT_OTHER): Payer: Medicare Other | Admitting: Physical Therapy

## 2021-07-06 ENCOUNTER — Encounter (HOSPITAL_BASED_OUTPATIENT_CLINIC_OR_DEPARTMENT_OTHER): Payer: Self-pay

## 2021-07-06 ENCOUNTER — Ambulatory Visit (HOSPITAL_BASED_OUTPATIENT_CLINIC_OR_DEPARTMENT_OTHER): Payer: Medicare Other | Admitting: Physical Therapy

## 2021-07-25 ENCOUNTER — Ambulatory Visit (HOSPITAL_BASED_OUTPATIENT_CLINIC_OR_DEPARTMENT_OTHER): Payer: Medicare Other | Attending: Family Medicine | Admitting: Physical Therapy

## 2021-07-25 ENCOUNTER — Encounter (HOSPITAL_BASED_OUTPATIENT_CLINIC_OR_DEPARTMENT_OTHER): Payer: Self-pay | Admitting: Physical Therapy

## 2021-07-25 ENCOUNTER — Other Ambulatory Visit: Payer: Self-pay

## 2021-07-25 DIAGNOSIS — R2681 Unsteadiness on feet: Secondary | ICD-10-CM | POA: Insufficient documentation

## 2021-07-25 DIAGNOSIS — M25561 Pain in right knee: Secondary | ICD-10-CM | POA: Insufficient documentation

## 2021-07-25 DIAGNOSIS — R293 Abnormal posture: Secondary | ICD-10-CM | POA: Insufficient documentation

## 2021-07-25 DIAGNOSIS — M6281 Muscle weakness (generalized): Secondary | ICD-10-CM | POA: Insufficient documentation

## 2021-07-25 DIAGNOSIS — R262 Difficulty in walking, not elsewhere classified: Secondary | ICD-10-CM | POA: Insufficient documentation

## 2021-07-25 DIAGNOSIS — G8929 Other chronic pain: Secondary | ICD-10-CM | POA: Insufficient documentation

## 2021-07-25 NOTE — Therapy (Signed)
OUTPATIENT PHYSICAL THERAPY TREATMENT NOTE   Patient Name: Victoria Holmes MRN: [id number] DOB:1980-08-13, 41 y.o., female Today's Date: 07/25/2021  PCP: Charlott Rakes, MD REFERRING PROVIDER: Charlott Rakes, MD   PT End of Session - 07/25/21 0906     Visit Number 4    Number of Visits 17    Date for PT Re-Evaluation 08/29/21    Authorization Type Medicare A and B    PT Start Time 0906    PT Stop Time 0948    PT Time Calculation (min) 42 min    Activity Tolerance Patient tolerated treatment well;Patient limited by pain              Past Medical History:  Diagnosis Date   Anemia    no current med.   Anxiety    Chronic tonsillitis 03/2012   Depression    Diabetes mellitus    diet-controlled   Fibrocystic breast disease    Headache(784.0)    migraines   History of endometriosis    Hyperlipidemia    Hypertension    under control, has been on med. x 1 yr.   Lupus (HCC)    Lupus (HCC)    Neuritis    Panic attacks    Raynauds disease    Rheumatoid arthritis(714.0)    Schizoaffective disorder (HCC)    Seizures (HCC)    last seizure > 1 yr. ago   Sinusitis    Sleep disturbance    Thyroid nodule    Past Surgical History:  Procedure Laterality Date   ABDOMINAL HYSTERECTOMY  6 yrs. ago   complete   CHOLECYSTECTOMY  6-8 yrs. ago   FOOT SURGERY  05/2011   right great toe   TONSILLECTOMY  04/22/2012   Procedure: TONSILLECTOMY;  Surgeon: Rozetta Nunnery, MD;  Location: [facility];  Service: ENT;  Laterality: N/A;   Patient Active Problem List   Diagnosis Date Noted   Controlled diabetes mellitus type 2 with complications ([place]) 75/64/3329   Rheumatoid arthritis ([place])    Raynauds disease    Precordial pain 05/18/2020   Essential hypertension 04/13/2020   Palpitations 04/13/2020   Carpopedal spasm 02/01/2015   Epilepsy ([place]) 11/24/2014   Tobacco use disorder 07/17/2014   Drug allergy, multiple 07/17/2014    REFERRING DIAG: M06.9 (ICD-10-CM)  - Rheumatoid arthritis involving multiple sites, unspecified whether rheumatoid factor present ([place]) M79.7 (ICD-10-CM) - Fibromyalgia   THERAPY DIAG:  Abnormal posture  Chronic pain of right knee  Difficulty in walking, not elsewhere classified  Muscle weakness (generalized)  Unsteadiness on feet  PERTINENT HISTORY: Lupus, HTN, Fibromyalgia, Depression, DM2, RA, anxiety, schizoaffective disorder, seizures (last time had happened over 3-4)  PRECAUTIONS: history of seizures  SUBJECTIVE: "In a Lupus and fibromyalgia flare.  Skin is burning.  OBJECTIVE:     PAIN:  Are you having pain? Yes VAS scale: 6/10 Pain location: all joints Pain orientation: Bilateral  Pain description: burning, dull, and stabbing  Aggravating factors: no pattern, sitting still for too long, standing for too long, lifting, walking, laying flat, stairs Relieving factors: ice/heat       HOME EXERCISE PROGRAM: Access Code: [email or web address] URL: [email or web address] Date: 05/31/2021 Prepared by: Daleen Bo    ASSESSMENT:   CLINICAL IMPRESSION: Pt in exacerbatiuon of Lupus as per pt.  Pain 6/10 as in prior sessions.  She is directed through hip hinges and STS submerged to focus on a goal set.  Pt edu and instruction on Importance of core strength/alignment of spine to  improve posture and back pain. Worked on maintaining tight core through all exercises. Pt benefitting from hydrostatic pressure improving circulation and decreasing inflamation.  Pt stayed aftersession x 10 minutes walking at chest level submersion.     Objective impairments include Abnormal gait, decreased activity tolerance, decreased balance, decreased endurance, decreased knowledge of condition, decreased mobility, difficulty walking, decreased ROM, decreased strength, hypomobility, increased muscle spasms, impaired flexibility, improper body mechanics, postural dysfunction, obesity, and pain. These impairments are limiting patient  from cleaning, community activity, driving, yard work, and shopping. Personal factors including Age, Fitness, Past/current experiences, Time since onset of injury/illness/exacerbation, and 3+ comorbidities:    are also affecting patient's functional outcome. Patient will benefit from skilled PT to address above impairments and improve overall function.   REHAB POTENTIAL: Fair     CLINICAL DECISION MAKING: Evolving/moderate complexity   EVALUATION COMPLEXITY: Moderate     GOALS:     SHORT TERM GOALS:   STG Name Target Date Goal status  1 Pt will become independent with HEP in order to demonstrate synthesis of PT education.   06/14/2021 INITIAL  2 Pt will report at least 2 pt reduction on NPRS scale for pain in order to demonstrate functional improvement with household activity, self care, and ADL.    06/28/2021 INITIAL  3 Pt will be able to demonstrate ability to perform hip hinge and STS without UE in order to demonstrate functional improvement in lumbar/LE function for daily mobility and house hold duties.   06/28/2021 INITIAL  4 Pt will score at least 5 pt increase on FOTO to demonstrate functional improvement in MCII and pt perceived function.    06/28/2021 INITIAL    LONG TERM GOALS:    LTG Name Target Date Goal status  1 Pt  will become independent with final HEP in order to demonstrate synthesis of PT education.   07/26/2021 INITIAL  2 Pt will be able to lift/squat/hold >20 lbs in order to demonstrate functional improvement in lumbopelvic strength for return to exercise and regular household activity.  07/26/2021 INITIAL  3 Pt will be able to demonstrate/report ability to sit/stand/sleep for extended periods of time without pain in order to demonstrate functional improvement and tolerance to static positioning.   07/26/2021 INITIAL  4 Pt will be able to demonstrate/report ability to walk >15 mins without pain in order to demonstrate functional improvement and tolerance to exercise and  community mobility.   07/26/2021 INITIAL    PLAN: PT FREQUENCY: 2x/week   PT DURATION: 8 weeks   PLANNED INTERVENTIONS: Therapeutic exercises, Therapeutic activity, Neuro Muscular re-education, Balance training, Gait training, Patient/Family education, Joint mobilization, Stair training, Aquatic Therapy, Dry Needling, Electrical stimulation, Spinal mobilization, Cryotherapy, Moist heat, scar mobilization, Taping, Vasopneumatic device, Traction, Ultrasound, Ionotophoresis 4mg /ml Dexamethasone, and Manual therapy       TODAY'S TREATMENT: 07/25/21 Pt seen for aquatic therapy today.  Treatment took place in water 3.25-4.8 ft in depth at the [facility] pool. Temp of water was 91.  Pt entered/exited the pool via stairs (step through pattern) independently with bilat rail.  Walking fwd,retro and side x 8 widths warmup  Seated stretching: gastroc, adductors and hamstring 3 x 30 sec hold Standing hip hinges 2x10 . Cues for TA tightness STS water bench 2x5. Instruction on TA tightness and importance cues for glute squeeze one standing   Standing exercises   Mini squats 3 ft x5   Shoulder horizontal abd/add x10  Shoulder flex x5, then elbow extension x5 with 1 foam  hand buoy   Walking between most exerises for stretching and recovery   Pt requires buoyancy for support and to offload joints with strengthening exercises. Viscosity of the water is needed for resistance of strengthening; water current perturbations provides challenge to standing balance unsupported, requiring increased core activation.    PLAN FOR NEXT SESSION: toleration of last session; add/modify le knee exercises, core and proximal strengthening  PATIENT EDUCATION:  Properties of water; benefits of aquatic therapy/advantages.     Mili Piltz (Frankie) Shermeka Rutt MPT  07/25/2021, 1:22 PM

## 2021-07-27 ENCOUNTER — Ambulatory Visit (HOSPITAL_BASED_OUTPATIENT_CLINIC_OR_DEPARTMENT_OTHER): Payer: Medicare Other | Admitting: Physical Therapy

## 2021-07-31 ENCOUNTER — Ambulatory Visit (HOSPITAL_BASED_OUTPATIENT_CLINIC_OR_DEPARTMENT_OTHER): Payer: Medicare Other | Admitting: Physical Therapy

## 2021-07-31 ENCOUNTER — Telehealth: Payer: Self-pay

## 2021-07-31 NOTE — Therapy (Incomplete)
OUTPATIENT PHYSICAL THERAPY TREATMENT NOTE   Patient Name: Victoria Holmes MRN: [id number] DOB:1980-11-05, 41 y.o., female Today's Date: 07/31/2021  PCP: Charlott Rakes, MD REFERRING PROVIDER: Charlott Rakes, MD      Past Medical History:  Diagnosis Date   Anemia    no current med.   Anxiety    Chronic tonsillitis 03/2012   Depression    Diabetes mellitus    diet-controlled   Fibrocystic breast disease    Headache(784.0)    migraines   History of endometriosis    Hyperlipidemia    Hypertension    under control, has been on med. x 1 yr.   Lupus (HCC)    Lupus (HCC)    Neuritis    Panic attacks    Raynauds disease    Rheumatoid arthritis(714.0)    Schizoaffective disorder (HCC)    Seizures (HCC)    last seizure > 1 yr. ago   Sinusitis    Sleep disturbance    Thyroid nodule    Past Surgical History:  Procedure Laterality Date   ABDOMINAL HYSTERECTOMY  6 yrs. ago   complete   CHOLECYSTECTOMY  6-8 yrs. ago   FOOT SURGERY  05/2011   right great toe   TONSILLECTOMY  04/22/2012   Procedure: TONSILLECTOMY;  Surgeon: Rozetta Nunnery, MD;  Location: [facility];  Service: ENT;  Laterality: N/A;   Patient Active Problem List   Diagnosis Date Noted   Controlled diabetes mellitus type 2 with complications ([place]) 07/62/2633   Rheumatoid arthritis ([place])    Raynauds disease    Precordial pain 05/18/2020   Essential hypertension 04/13/2020   Palpitations 04/13/2020   Carpopedal spasm 02/01/2015   Epilepsy ([place]) 11/24/2014   Tobacco use disorder 07/17/2014   Drug allergy, multiple 07/17/2014    REFERRING DIAG: M06.9 (ICD-10-CM) - Rheumatoid arthritis involving multiple sites, unspecified whether rheumatoid factor present ([place]) M79.7 (ICD-10-CM) - Fibromyalgia   THERAPY DIAG:  No diagnosis found.  PERTINENT HISTORY: Lupus, HTN, Fibromyalgia, Depression, DM2, RA, anxiety, schizoaffective disorder, seizures (last time had happened over  3-4)  PRECAUTIONS: history of seizures  SUBJECTIVE: "In a Lupus and fibromyalgia flare.  Skin is burning.    PAIN:  Are you having pain? Yes VAS scale: 6/10 Pain location: all joints Pain orientation: Bilateral  Pain description: burning, dull, and stabbing  Aggravating factors: no pattern, sitting still for too long, standing for too long, lifting, walking, laying flat, stairs Relieving factors: ice/heat     OBJECTIVE:     TODAY'S TREATMENT: 07/25/21 Pt seen for aquatic therapy today.  Treatment took place in water 3.25-4.8 ft in depth at the [facility] pool. Temp of water was 91.  Pt entered/exited the pool via stairs (step through pattern) independently with bilat rail.  Walking fwd,retro and side x 8 widths warmup  Seated stretching: gastroc, adductors and hamstring 3 x 30 sec hold Standing hip hinges 2x10 . Cues for TA tightness STS water bench 2x5. Instruction on TA tightness and importance cues for glute squeeze one standing   Standing exercises   Mini squats 3 ft x5   Shoulder horizontal abd/add x10  Shoulder flex x5, then elbow extension x5 with 1 foam hand buoy   Walking between most exerises for stretching and recovery   Pt requires buoyancy for support and to offload joints with strengthening exercises. Viscosity of the water is needed for resistance of strengthening; water current perturbations provides challenge to standing balance unsupported, requiring increased core activation.  PATIENT EDUCATION:  Aquatic exercise, anatomy, exercise progression, DOMS expectations, muscle firing, HEP, POC   HOME EXERCISE PROGRAM: Access Code: [email or web address] URL: [email or web address] Date: 05/31/2021 Prepared by: Daleen Bo   ASSESSMENT:   CLINICAL IMPRESSION: Pt in exacerbatiuon of Lupus as per pt.  Pain 6/10 as in prior sessions.  She is directed through hip hinges and STS submerged to focus on a goal set.  Pt edu and instruction on Importance  of core strength/alignment of spine to improve posture and back pain. Worked on maintaining tight core through all exercises. Pt benefitting from hydrostatic pressure improving circulation and decreasing inflamation.  Pt stayed aftersession x 10 minutes walking at chest level submersion.     Objective impairments include Abnormal gait, decreased activity tolerance, decreased balance, decreased endurance, decreased knowledge of condition, decreased mobility, difficulty walking, decreased ROM, decreased strength, hypomobility, increased muscle spasms, impaired flexibility, improper body mechanics, postural dysfunction, obesity, and pain. These impairments are limiting patient from cleaning, community activity, driving, yard work, and shopping. Personal factors including Age, Fitness, Past/current experiences, Time since onset of injury/illness/exacerbation, and 3+ comorbidities:    are also affecting patient's functional outcome. Patient will benefit from skilled PT to address above impairments and improve overall function.   REHAB POTENTIAL: Fair     CLINICAL DECISION MAKING: Evolving/moderate complexity   EVALUATION COMPLEXITY: Moderate     GOALS:     SHORT TERM GOALS:   STG Name Target Date Goal status  1 Pt will become independent with HEP in order to demonstrate synthesis of PT education.   06/14/2021 INITIAL  2 Pt will report at least 2 pt reduction on NPRS scale for pain in order to demonstrate functional improvement with household activity, self care, and ADL.    06/28/2021 INITIAL  3 Pt will be able to demonstrate ability to perform hip hinge and STS without UE in order to demonstrate functional improvement in lumbar/LE function for daily mobility and house hold duties.   06/28/2021 INITIAL  4 Pt will score at least 5 pt increase on FOTO to demonstrate functional improvement in MCII and pt perceived function.    06/28/2021 INITIAL    LONG TERM GOALS:    LTG Name Target Date Goal  status  1 Pt  will become independent with final HEP in order to demonstrate synthesis of PT education.   07/26/2021 INITIAL  2 Pt will be able to lift/squat/hold >20 lbs in order to demonstrate functional improvement in lumbopelvic strength for return to exercise and regular household activity.  07/26/2021 INITIAL  3 Pt will be able to demonstrate/report ability to sit/stand/sleep for extended periods of time without pain in order to demonstrate functional improvement and tolerance to static positioning.   07/26/2021 INITIAL  4 Pt will be able to demonstrate/report ability to walk >15 mins without pain in order to demonstrate functional improvement and tolerance to exercise and community mobility.   07/26/2021 INITIAL    PLAN: PT FREQUENCY: 2x/week   PT DURATION: 8 weeks   PLANNED INTERVENTIONS: Therapeutic exercises, Therapeutic activity, Neuro Muscular re-education, Balance training, Gait training, Patient/Family education, Joint mobilization, Stair training, Aquatic Therapy, Dry Needling, Electrical stimulation, Spinal mobilization, Cryotherapy, Moist heat, scar mobilization, Taping, Vasopneumatic device, Traction, Ultrasound, Ionotophoresis 4mg /ml Dexamethasone, and Manual therapy      PLAN FOR NEXT SESSION: toleration of last session; add/modify le knee exercises, core and proximal strengthening     Daleen Bo PT, DPT 07/31/21 9:08 AM

## 2021-07-31 NOTE — Telephone Encounter (Signed)
Contacted pt to scheduled Medicare Wellness pt didn't answer lvm

## 2021-08-02 ENCOUNTER — Ambulatory Visit (HOSPITAL_BASED_OUTPATIENT_CLINIC_OR_DEPARTMENT_OTHER): Payer: Medicare Other | Admitting: Physical Therapy

## 2021-08-07 ENCOUNTER — Ambulatory Visit (HOSPITAL_BASED_OUTPATIENT_CLINIC_OR_DEPARTMENT_OTHER): Payer: Medicare Other | Admitting: Physical Therapy

## 2021-08-09 ENCOUNTER — Other Ambulatory Visit: Payer: Self-pay | Admitting: Family Medicine

## 2021-08-09 ENCOUNTER — Ambulatory Visit (HOSPITAL_BASED_OUTPATIENT_CLINIC_OR_DEPARTMENT_OTHER): Payer: Medicare Other | Admitting: Physical Therapy

## 2021-08-09 DIAGNOSIS — E1169 Type 2 diabetes mellitus with other specified complication: Secondary | ICD-10-CM

## 2021-08-14 ENCOUNTER — Other Ambulatory Visit: Payer: Self-pay | Admitting: Family Medicine

## 2021-08-14 ENCOUNTER — Encounter (HOSPITAL_BASED_OUTPATIENT_CLINIC_OR_DEPARTMENT_OTHER): Payer: Medicare Other | Admitting: Physical Therapy

## 2021-08-14 NOTE — Telephone Encounter (Signed)
Requested medications are due for refill today.  yes  Requested medications are on the active medications list.  yes  Last refill. 04/05/2021  Future visit scheduled.   yes  Notes to clinic.  Medication not delegated.    Requested Prescriptions  Pending Prescriptions Disp Refills   butalbital-acetaminophen-caffeine (FIORICET) 50-325-40 MG tablet [Pharmacy Med Name: butalbital-acetaminophen-caffeine 50 mg-325 mg-40 mg tablet] 20 tablet 0    Sig: Take 1-2 tablets by mouth every 6 (six) hours as needed for headache.     Not Delegated - Analgesics:  Non-Opioid Analgesic Combinations Failed - 08/14/2021  4:23 PM      Failed - This refill cannot be delegated      Passed - Valid encounter within last 12 months    Recent Outpatient Visits           3 months ago Rheumatoid arthritis involving multiple sites, unspecified whether rheumatoid factor present [facility])   [place], Charlane Ferretti, MD   4 months ago Other microscopic hematuria   [place], Charlane Ferretti, MD   6 months ago Type 2 diabetes mellitus with other specified complication, without long-term current use of insulin ([place])   [place], Enobong, MD   9 months ago Urinary symptom or sign   [place], MD       Future Appointments             In 1 month Charlott Rakes, MD [place]

## 2021-08-15 ENCOUNTER — Ambulatory Visit: Payer: Medicare Other | Admitting: Family Medicine

## 2021-08-23 ENCOUNTER — Other Ambulatory Visit: Payer: Self-pay

## 2021-08-23 MED ORDER — PROPRANOLOL HCL 20 MG PO TABS: ORAL_TABLET | ORAL | 2 refills | Status: DC

## 2021-09-12 ENCOUNTER — Telehealth: Payer: Self-pay | Admitting: Family Medicine

## 2021-09-12 NOTE — Telephone Encounter (Signed)
Patient called in stats, referral to [facility] wont work becsue she was told they are not doing nymore in house biopsies because the Dr is leaving in May, so he needs another referral. Please cal back

## 2021-09-13 ENCOUNTER — Other Ambulatory Visit: Payer: Self-pay | Admitting: Family Medicine

## 2021-09-13 DIAGNOSIS — I10 Essential (primary) hypertension: Secondary | ICD-10-CM

## 2021-09-13 NOTE — Telephone Encounter (Signed)
Requesting early. Filled today. Requested Prescriptions  Pending Prescriptions Disp Refills   amLODipine (NORVASC) 5 MG tablet [Pharmacy Med Name: amlodipine 5 mg tablet] 90 tablet 1    Sig: Take 1 tablet (5 mg total) by mouth daily.     Cardiovascular: Calcium Channel Blockers 2 Passed - 09/13/2021 12:50 PM      Passed - Last BP in normal range    BP Readings from Last 1 Encounters:  05/11/21 119/81         Passed - Last Heart Rate in normal range    Pulse Readings from Last 1 Encounters:  05/11/21 73         Passed - Valid encounter within last 6 months    Recent Outpatient Visits          4 months ago Rheumatoid arthritis involving multiple sites, unspecified whether rheumatoid factor present [facility])   [place], Charlane Ferretti, MD   5 months ago Other microscopic hematuria   [place], Charlane Ferretti, MD   7 months ago Type 2 diabetes mellitus with other specified complication, without long-term current use of insulin ([place])   [place], Charlane Ferretti, MD   10 months ago Urinary symptom or sign   [place], MD      Future Appointments            In 3 weeks Charlott Rakes, MD [place]

## 2021-10-03 ENCOUNTER — Other Ambulatory Visit: Payer: Self-pay | Admitting: Obstetrics and Gynecology

## 2021-10-03 ENCOUNTER — Other Ambulatory Visit (HOSPITAL_COMMUNITY)
Admission: RE | Admit: 2021-10-03 | Discharge: 2021-10-03 | Disposition: A | Discharge status: Home / Self Care | Payer: Medicare Other | Source: Ambulatory Visit | Attending: Obstetrics and Gynecology | Admitting: Obstetrics and Gynecology

## 2021-10-03 DIAGNOSIS — Z01419 Encounter for gynecological examination (general) (routine) without abnormal findings: Secondary | ICD-10-CM | POA: Insufficient documentation

## 2021-10-03 DIAGNOSIS — Z1151 Encounter for screening for human papillomavirus (HPV): Secondary | ICD-10-CM | POA: Insufficient documentation

## 2021-10-03 DIAGNOSIS — R87612 Low grade squamous intraepithelial lesion on cytologic smear of cervix (LGSIL): Secondary | ICD-10-CM | POA: Diagnosis not present

## 2021-10-04 ENCOUNTER — Other Ambulatory Visit: Payer: Self-pay | Admitting: Family Medicine

## 2021-10-04 DIAGNOSIS — I10 Essential (primary) hypertension: Secondary | ICD-10-CM

## 2021-10-05 ENCOUNTER — Other Ambulatory Visit: Payer: Self-pay | Admitting: Obstetrics and Gynecology

## 2021-10-05 NOTE — Telephone Encounter (Signed)
Requested Prescriptions  ?Pending Prescriptions Disp Refills  ?? amLODipine (NORVASC) 5 MG tablet [Pharmacy Med Name: amlodipine 5 mg tablet] 90 tablet 1  ?  Sig: Take 1 tablet (5 mg total) by mouth daily.  ?  ? Cardiovascular: Calcium Channel Blockers 2 Passed - 10/04/2021  3:54 PM  ?  ?  Passed - Last BP in normal range  ?  BP Readings from Last 1 Encounters:  ?05/11/21 119/81  ?   ?  ?  Passed - Last Heart Rate in normal range  ?  Pulse Readings from Last 1 Encounters:  ?05/11/21 73  ?   ?  ?  Passed - Valid encounter within last 6 months  ?  Recent Outpatient Visits   ?      ? 4 months ago Rheumatoid arthritis involving multiple sites, unspecified whether rheumatoid factor present ([place])  ? [place], Charlane Ferretti, MD  ? 6 months ago Other microscopic hematuria  ? [place], Charlane Ferretti, MD  ? 8 months ago Type 2 diabetes mellitus with other specified complication, without long-term current use of insulin ([place])  ? [place], Charlane Ferretti, MD  ? 11 months ago Urinary symptom or sign  ? [place] Charlott Rakes, MD  ?  ?  ?Future Appointments   ?        ? In 5 days Charlott Rakes, MD [place]  ?  ? ?  ?  ?  ? ?

## 2021-10-06 LAB — CYTOLOGY - PAP
Comment: NEGATIVE
High risk HPV: NEGATIVE

## 2021-10-10 ENCOUNTER — Other Ambulatory Visit: Payer: Self-pay

## 2021-10-10 ENCOUNTER — Ambulatory Visit: Payer: Medicare Other | Attending: Family Medicine | Admitting: Family Medicine

## 2021-10-10 VITALS — BP 116/79 | HR 85 | Ht 62.0 in | Wt 187.6 lb

## 2021-10-10 DIAGNOSIS — M797 Fibromyalgia: Secondary | ICD-10-CM | POA: Insufficient documentation

## 2021-10-10 DIAGNOSIS — F4310 Post-traumatic stress disorder, unspecified: Secondary | ICD-10-CM | POA: Diagnosis not present

## 2021-10-10 DIAGNOSIS — Z1159 Encounter for screening for other viral diseases: Secondary | ICD-10-CM | POA: Insufficient documentation

## 2021-10-10 DIAGNOSIS — M25531 Pain in right wrist: Secondary | ICD-10-CM | POA: Insufficient documentation

## 2021-10-10 DIAGNOSIS — Z79899 Other long term (current) drug therapy: Secondary | ICD-10-CM | POA: Diagnosis not present

## 2021-10-10 DIAGNOSIS — Z7984 Long term (current) use of oral hypoglycemic drugs: Secondary | ICD-10-CM | POA: Insufficient documentation

## 2021-10-10 DIAGNOSIS — Z114 Encounter for screening for human immunodeficiency virus [HIV]: Secondary | ICD-10-CM | POA: Diagnosis not present

## 2021-10-10 DIAGNOSIS — R109 Unspecified abdominal pain: Secondary | ICD-10-CM | POA: Diagnosis not present

## 2021-10-10 DIAGNOSIS — Z7182 Exercise counseling: Secondary | ICD-10-CM | POA: Insufficient documentation

## 2021-10-10 DIAGNOSIS — I7300 Raynaud's syndrome without gangrene: Secondary | ICD-10-CM | POA: Diagnosis not present

## 2021-10-10 DIAGNOSIS — R6881 Early satiety: Secondary | ICD-10-CM | POA: Diagnosis not present

## 2021-10-10 DIAGNOSIS — R112 Nausea with vomiting, unspecified: Secondary | ICD-10-CM | POA: Diagnosis not present

## 2021-10-10 DIAGNOSIS — E1140 Type 2 diabetes mellitus with diabetic neuropathy, unspecified: Secondary | ICD-10-CM | POA: Diagnosis not present

## 2021-10-10 DIAGNOSIS — E1169 Type 2 diabetes mellitus with other specified complication: Secondary | ICD-10-CM | POA: Insufficient documentation

## 2021-10-10 DIAGNOSIS — B009 Herpesviral infection, unspecified: Secondary | ICD-10-CM | POA: Diagnosis not present

## 2021-10-10 DIAGNOSIS — M069 Rheumatoid arthritis, unspecified: Secondary | ICD-10-CM | POA: Insufficient documentation

## 2021-10-10 DIAGNOSIS — Z7952 Long term (current) use of systemic steroids: Secondary | ICD-10-CM | POA: Diagnosis not present

## 2021-10-10 LAB — POCT GLYCOSYLATED HEMOGLOBIN (HGB A1C): HbA1c, POC (controlled diabetic range): 6 % (ref 0.0–7.0)

## 2021-10-10 LAB — GLUCOSE, POCT (MANUAL RESULT ENTRY): POC Glucose: 99 mg/dl (ref 70–99)

## 2021-10-10 MED ORDER — PREDNISONE 20 MG PO TABS: 20.0000 mg | ORAL_TABLET | Freq: Every day | ORAL | 0 refills | Status: DC

## 2021-10-10 MED ORDER — METFORMIN HCL 500 MG PO TABS: 500.0000 mg | ORAL_TABLET | Freq: Every day | ORAL | 6 refills | Status: DC

## 2021-10-10 MED ORDER — DULOXETINE HCL 60 MG PO CPEP: 60.0000 mg | ORAL_CAPSULE | Freq: Every day | ORAL | 6 refills | Status: DC

## 2021-10-10 MED ORDER — ATORVASTATIN CALCIUM 20 MG PO TABS: 20.0000 mg | ORAL_TABLET | Freq: Every day | ORAL | 6 refills | Status: DC

## 2021-10-10 MED ORDER — TIZANIDINE HCL 4 MG PO TABS: 4.0000 mg | ORAL_TABLET | Freq: Three times a day (TID) | ORAL | 1 refills | Status: DC | PRN

## 2021-10-10 MED ORDER — OZEMPIC (0.25 OR 0.5 MG/DOSE) 2 MG/1.5ML ~~LOC~~ SOPN: 0.5000 mg | PEN_INJECTOR | SUBCUTANEOUS | 6 refills | Status: DC

## 2021-10-10 NOTE — Patient Instructions (Signed)
Nausea, Adult ?Nausea is the feeling of having an upset stomach or that you are about to vomit. Nausea on its own is not usually a serious concern, but it may be an early sign of a more serious medical problem. As nausea gets worse, it can lead to vomiting. If vomiting develops, or if you are not able to drink enough fluids, you are at risk of becoming dehydrated. ?Dehydration can make you tired and thirsty, cause you to have a dry mouth, and decrease how often you urinate. Older adults and people with other diseases or a weak disease-fighting system (immune system) are at higher risk for dehydration. The main goals of treating your nausea are: ?To relieve your nausea. ?To limit repeated nausea episodes. ?To prevent vomiting and dehydration. ?Follow these instructions at home: ?Watch your symptoms for any changes. Tell your health care provider about them. ?Eating and drinking ?  ?Take an oral rehydration solution (ORS). This is a drink that is sold at pharmacies and retail stores. ?Drink clear fluids slowly and in small amounts as you are able. Clear fluids include water, ice chips, low-calorie sports drinks, and fruit juice that has water added (diluted fruit juice). ?Eat bland, easy-to-digest foods in small amounts as you are able. These foods include bananas, applesauce, rice, lean meats, toast, and crackers. ?Avoid drinking fluids that contain a lot of sugar or caffeine, such as energy drinks, sports drinks, and soda. ?Avoid alcohol. ?Avoid spicy or fatty foods. ?General instructions ?Take over-the-counter and prescription medicines only as told by your health care provider. ?Rest at home while you recover. ?Drink enough fluid to keep your urine pale yellow. ?Breathe slowly and deeply when you feel nauseous. ?Avoid smelling things that have strong odors. ?Wash your hands often using soap and water for at least 20 seconds. If soap and water are not available, use hand sanitizer. ?Make sure that everyone in your  household washes their hands well and often. ?Keep all follow-up visits. This is important. ?Contact a health care provider if: ?Your nausea gets worse. ?Your nausea does not go away after two days. ?You vomit multiple times. ?You cannot drink fluids without vomiting. ?You have any of the following: ?New symptoms. ?A fever. ?A headache. ?Muscle cramps. ?A rash. ?Pain while urinating. ?You feel light-headed or dizzy. ?Get help right away if: ?You have pain in your chest, neck, arm, or jaw. ?You feel extremely weak or you faint. ?You have vomit that is bright red or looks like coffee grounds. ?You have bloody or black stools (feces) or stools that look like tar. ?You have a severe headache, a stiff neck, or both. ?You have severe pain, cramping, or bloating in your abdomen. ?You have difficulty breathing or are breathing very quickly. ?Your heart is beating very quickly. ?Your skin feels cold and clammy. ?You feel confused. ?You have signs of dehydration, such as: ?Dark urine, very little urine, or no urine. ?Cracked lips. ?Dry mouth. ?Sunken eyes. ?Sleepiness. ?Weakness. ?These symptoms may be an emergency. Get help right away. Call 911. ?Do not wait to see if the symptoms will go away. ?Do not drive yourself to the hospital. ?Summary ?Nausea is the feeling that you have an upset stomach or that you are about to vomit. Nausea on its own is not usually a serious concern, but it may be an early sign of a more serious medical problem. ?If vomiting develops, or if you are not able to drink enough fluids, you are at risk of becoming dehydrated. ?Follow  recommendations for eating and drinking and take over-the-counter and prescription medicines only as told by your health care provider. ?Contact a health care provider right away if your symptoms worsen or you have new symptoms. ?Keep all follow-up visits. This is important. ?This information is not intended to replace advice given to you by your health care provider. Make  sure you discuss any questions you have with your health care provider. ?Document Revised: 01/13/2021 Document Reviewed: 01/13/2021 ?Elsevier Patient Education ? [place]. ? ?

## 2021-10-10 NOTE — Progress Notes (Signed)
? ?Subjective:  ?Patient ID: Victoria Holmes, female    DOB: [birth year]  Age: 41 y.o. MRN: [id number] ? ?CC: Diabetes ? ? ?HPI ?Hanya Guerin is a 41 y.o. year old female with a history of type 2 diabetes mellitus (A1c 6.0) rheumatoid arthritis, lupus, Raynaud's syndrome, Schizoaffective disorder, PTSD, on Valtrex for Herpes Simplex prophylaxis, pseudotumor cerebri. ? ?Interval History: ?She complains of nausea which has worsened and she has no appetitie. This has been present prior to getting on Ozempic. States she does not know if it is 'her nerves'. When she is going through a rheumatoid flare she also feels nauseated. ?She has abdominal pain (states she has always had problems with her stomach) ?Endorses presence of early satiety; currently on a PPI.  Does not have diarrhea or constipation. ? ?She is having pain with twisting her R wrist x1 week and she is unsure if it is her lupus or fibromyalgia. She is yet to have a visit with Rheumatology but she states she dropped her records at Dr Jerrye Bushy office and no one has gotten in touch with her. ?Also on Cymbalta for her fibromyalgia. ? ?For her diabetes she is doing well on Ozempic and has lost 13 pounds.  Neuropathy is controlled on Lyrica. ?She is doing well on her statin and antihypertensive. ?Past Medical History:  ?Diagnosis Date  ? Anemia   ? no current med.  ? Anxiety   ? Chronic tonsillitis 03/2012  ? Depression   ? Diabetes mellitus   ? diet-controlled  ? Fibrocystic breast disease   ? Headache(784.0)   ? migraines  ? History of endometriosis   ? Hyperlipidemia   ? Hypertension   ? under control, has been on med. x 1 yr.  ? Lupus ([place])   ? Lupus ([place])   ? Neuritis   ? Panic attacks   ? Raynauds disease   ? Rheumatoid arthritis(714.0)   ? Schizoaffective disorder ([place])   ? Seizures ([place])   ? last seizure > 1 yr. ago  ? Sinusitis   ? Sleep disturbance   ? Thyroid nodule   ? ? ?Past Surgical History:  ?Procedure Laterality Date  ? ABDOMINAL HYSTERECTOMY  6 yrs. ago   ? complete  ? CHOLECYSTECTOMY  6-8 yrs. ago  ? FOOT SURGERY  05/2011  ? right great toe  ? TONSILLECTOMY  04/22/2012  ? Procedure: TONSILLECTOMY;  Surgeon: Rozetta Nunnery, MD;  Location: [facility];  Service: ENT;  Laterality: N/A;  ? ? ?Family History  ?Problem Relation Age of Onset  ? Diabetes Paternal Grandmother   ? Hypertension Paternal Grandmother   ? Kidney disease Paternal Grandmother   ? Heart attack Maternal Grandmother   ?     late 35's  ? Arthritis Maternal Grandfather   ? Cancer Maternal Grandfather   ?     lung  ? Lupus Other   ? ? ?Social History  ? ?Socioeconomic History  ? Marital status: Single  ?  Spouse name: Not on file  ? Number of children: 1  ? Years of education: Not on file  ? Highest education level: Not on file  ?Occupational History  ? Occupation: [employer]  ?Tobacco Use  ? Smoking status: Former  ?  Packs/day: 0.25  ?  Years: 3.00  ?  Pack years: 0.75  ?  Types: Cigarettes  ?  Quit date: 01/2019  ?  Years since quitting: 2.7  ? Smokeless tobacco: Never  ? Tobacco comments:  ?  3 cig./day  ?Vaping Use  ? Vaping Use: Never used  ?Substance and Sexual Activity  ? Alcohol use: Yes  ?  Alcohol/week: 0.0 standard drinks  ?  Comment: occasionally  ? Drug use: Yes  ?  Types: Marijuana  ? Sexual activity: Not on file  ?Other Topics Concern  ? Not on file  ?Social History Narrative  ? Not on file  ? ?Social Determinants of Health  ? ?Financial Resource Strain: Not on file  ?Food Insecurity: Not on file  ?Transportation Needs: Not on file  ?Physical Activity: Not on file  ?Stress: Not on file  ?Social Connections: Not on file  ? ? ?Allergies  ?Allergen Reactions  ? Amoxicillin Hives and Rash  ?  Has patient had a PCN reaction causing immediate rash, facial/tongue/throat swelling, SOB or lightheadedness with hypotension: Yes ?Has patient had a PCN reaction causing severe rash involving mucus membranes or skin necrosis: Yes ?Has patient had a PCN reaction that required  hospitalization: No ?Has patient had a PCN reaction occurring within the last 10 years: No ?If all of the above answers are "NO", then may proceed with Cephalosporin use. ?  ? Azithromycin Shortness Of Breath and Rash  ? Darvocet [Propoxyphene N-Acetaminophen] Shortness Of Breath and Rash  ? Grapeseed Extract [Nutritional Supplements] Shortness Of Breath, Swelling and Rash  ?  GRAPES  ? Kiwi Extract Shortness Of Breath, Swelling and Rash  ? Latex Shortness Of Breath  ? Macrolides And Ketolides Shortness Of Breath and Rash  ? Morphine And Related Shortness Of Breath, Rash and Other (See Comments)  ?  MUSCLE RIGIDITY  ? Sulfa Antibiotics Shortness Of Breath  ?  MUSCLE RIGIDITY  ? Klonopin [Clonazepam] Rash and Other (See Comments)  ?  HOMICIDAL THOUGHTS  ? Penicillins Itching and Swelling  ?  Swelling of mouth ?Has patient had a PCN reaction causing immediate rash, facial/tongue/throat swelling, SOB or lightheadedness with hypotension: Yes ?Has patient had a PCN reaction causing severe rash involving mucus membranes or skin necrosis: No ?Has patient had a PCN reaction that required hospitalization: No ?Has patient had a PCN reaction occurring within the last 10 years: No ?If all of the above answers are "NO", then may proceed with Cephalosporin use. ?  ? Zoloft [Sertraline Hcl] Other (See Comments)  ?  TARDIVE DYSKINESIA  ? Contrast Media [Iodinated Contrast Media] Swelling  ?  Pt CAN NOT have drinking contrast; She CAN have IV contrast; when pt drinks water soluble contrast, she develops shallow breathing and hives along with Herpes Type 2 around her eyes  ? Flagyl [Metronidazole] Nausea And Vomiting  ? Adhesive [Tape] Rash  ? Concerta [Methylphenidate] Nausea Only  ? Lexapro [Escitalopram Oxalate] Diarrhea and Rash  ? Lortab [Hydrocodone-Acetaminophen] Rash  ? ? ?Outpatient Medications Prior to Visit  ?Medication Sig Dispense Refill  ? Accu-Chek Softclix Lancets lancets Use to check blood sugar three times daily.  E11.65 100 each 3  ? amLODipine (NORVASC) 5 MG tablet Take 1 tablet (5 mg total) by mouth daily. 90 tablet 1  ? atorvastatin (LIPITOR) 20 MG tablet Take 1 tablet (20 mg total) by mouth daily. 90 tablet 2  ? Blood Glucose Monitoring Suppl (ACCU-CHEK GUIDE ME) w/Device KIT Use to check blood sugar three times daily. E11.65 1 kit 0  ? butalbital-acetaminophen-caffeine (FIORICET) 50-325-40 MG tablet Take 1-2 tablets by mouth every 6 (six) hours as needed for headache. 20 tablet 0  ? dapagliflozin propanediol (FARXIGA) 5 MG TABS tablet Take 1 tablet (  5 mg total) by mouth daily before breakfast. 30 tablet 3  ? diclofenac sodium (VOLTAREN) 1 % GEL Apply 2 g topically 4 (four) times daily as needed (for pain).    ? DULoxetine (CYMBALTA) 60 MG capsule Take 1 capsule (60 mg total) by mouth daily. 30 capsule 6  ? EPINEPHrine (EPIPEN JR) 0.15 MG/0.3ML injection Inject 0.15 mg into the muscle as needed.    ? glucose blood (ACCU-CHEK GUIDE) test strip Use to check blood sugar three times daily. E11.65 100 each 3  ? hydroxychloroquine (PLAQUENIL) 200 MG tablet Take 200 mg by mouth 2 (two) times daily.    ? ibuprofen (ADVIL,MOTRIN) 200 MG tablet Take 200 mg by mouth every 6 (six) hours as needed for fever, headache, mild pain, moderate pain or cramping.    ? metFORMIN (GLUCOPHAGE) 500 MG tablet Take 1 tablet (500 mg total) by mouth daily with breakfast. 30 tablet 0  ? predniSONE (DELTASONE) 20 MG tablet Take 1 tablet (20 mg total) by mouth daily with breakfast. 5 tablet 0  ? pregabalin (LYRICA) 100 MG capsule Take 100 mg by mouth 2 (two) times daily.    ? propranolol (INDERAL) 20 MG tablet Take 2 tablets twice a day 120 tablet 2  ? Semaglutide,0.25 or 0.5MG/DOS, (OZEMPIC, 0.25 OR 0.5 MG/DOSE,) 2 MG/1.5ML SOPN Inject 0.5 mg into the skin once a week. 2 mL 6  ? tiZANidine (ZANAFLEX) 4 MG tablet Take 1 tablet (4 mg total) by mouth every 6 (six) hours as needed for muscle spasms. 60 tablet 1  ? valACYclovir (VALTREX) 1000 MG tablet  Take 1,000 mg by mouth daily.    ? ?No facility-administered medications prior to visit.  ? ? ? ?ROS ?Review of Systems  ?Constitutional:  Negative for activity change, appetite change and fatigue.  ?HENT:  Negative

## 2021-10-11 ENCOUNTER — Encounter: Payer: Self-pay | Admitting: Family Medicine

## 2021-10-23 ENCOUNTER — Encounter: Payer: Self-pay | Admitting: Internal Medicine

## 2021-10-25 ENCOUNTER — Ambulatory Visit: Payer: Medicare Other | Attending: Family Medicine

## 2021-10-25 DIAGNOSIS — Z1159 Encounter for screening for other viral diseases: Secondary | ICD-10-CM

## 2021-10-25 DIAGNOSIS — Z114 Encounter for screening for human immunodeficiency virus [HIV]: Secondary | ICD-10-CM

## 2021-10-25 DIAGNOSIS — E1169 Type 2 diabetes mellitus with other specified complication: Secondary | ICD-10-CM

## 2021-10-25 DIAGNOSIS — R112 Nausea with vomiting, unspecified: Secondary | ICD-10-CM

## 2021-10-27 LAB — H. PYLORI BREATH TEST: H pylori Breath Test: POSITIVE — AB

## 2021-10-28 ENCOUNTER — Other Ambulatory Visit: Payer: Self-pay | Admitting: Family Medicine

## 2021-10-28 LAB — CMP14+EGFR
ALT: 32 IU/L (ref 0–32)
AST: 21 IU/L (ref 0–40)
Albumin/Globulin Ratio: 1.6 (ref 1.2–2.2)
Albumin: 4.9 g/dL — ABNORMAL HIGH (ref 3.8–4.8)
Alkaline Phosphatase: 96 IU/L (ref 44–121)
BUN/Creatinine Ratio: 13 (ref 9–23)
BUN: 10 mg/dL (ref 6–24)
Bilirubin Total: 0.5 mg/dL (ref 0.0–1.2)
CO2: 24 mmol/L (ref 20–29)
Calcium: 9.9 mg/dL (ref 8.7–10.2)
Chloride: 106 mmol/L (ref 96–106)
Creatinine, Ser: 0.75 mg/dL (ref 0.57–1.00)
Globulin, Total: 3 g/dL (ref 1.5–4.5)
Glucose: 88 mg/dL (ref 70–99)
Potassium: 4.4 mmol/L (ref 3.5–5.2)
Sodium: 144 mmol/L (ref 134–144)
Total Protein: 7.9 g/dL (ref 6.0–8.5)
eGFR: 103 mL/min/{1.73_m2} (ref >59)

## 2021-10-28 LAB — HCV INTERPRETATION

## 2021-10-28 LAB — MICROALBUMIN / CREATININE URINE RATIO
Creatinine, Urine: 81.7 mg/dL
Microalb/Creat Ratio: 9 mg/g creat (ref 0–29)
Microalbumin, Urine: 7.1 ug/mL

## 2021-10-28 LAB — LP+NON-HDL CHOLESTEROL
Cholesterol, Total: 99 mg/dL — ABNORMAL LOW (ref 100–199)
HDL: 40 mg/dL (ref >39)
LDL Chol Calc (NIH): 45 mg/dL (ref 0–99)
Total Non-HDL-Chol (LDL+VLDL): 59 mg/dL (ref 0–129)
Triglycerides: 65 mg/dL (ref 0–149)
VLDL Cholesterol Cal: 14 mg/dL (ref 5–40)

## 2021-10-28 LAB — HIV ANTIBODY (ROUTINE TESTING W REFLEX): HIV Screen 4th Generation wRfx: NONREACTIVE

## 2021-10-28 LAB — HCV AB W REFLEX TO QUANT PCR: HCV Ab: NONREACTIVE

## 2021-10-28 MED ORDER — PYLERA 140-125-125 MG PO CAPS: 3.0000 | ORAL_CAPSULE | Freq: Three times a day (TID) | ORAL | 0 refills | Status: DC

## 2021-10-28 MED ORDER — OMEPRAZOLE 20 MG PO CPDR: 20.0000 mg | DELAYED_RELEASE_CAPSULE | Freq: Two times a day (BID) | ORAL | 0 refills | Status: DC

## 2021-10-30 ENCOUNTER — Ambulatory Visit: Payer: Self-pay | Admitting: *Deleted

## 2021-10-30 ENCOUNTER — Telehealth: Payer: Self-pay | Admitting: Pharmacist

## 2021-10-30 MED ORDER — METRONIDAZOLE 250 MG PO TABS: 250.0000 mg | ORAL_TABLET | Freq: Four times a day (QID) | ORAL | 0 refills | Status: DC

## 2021-10-30 MED ORDER — TETRACYCLINE HCL 500 MG PO CAPS: 500.0000 mg | ORAL_CAPSULE | Freq: Four times a day (QID) | ORAL | 0 refills | Status: DC

## 2021-10-30 MED ORDER — PEPTO-BISMOL 524 MG/30ML PO SUSP
ORAL | 0 refills | Status: DC
Start: 2021-10-30 — End: 2022-09-12

## 2021-10-30 NOTE — Telephone Encounter (Signed)
Rxns sent to [facility] for Flagyl, Tetracycline and Bismuth. ?

## 2021-10-30 NOTE — Telephone Encounter (Signed)
I returned pt's call.   She saw on her MyChart that her H. Pylori test is positive.   She wants to know if she should take her Ozempic tonight at 6:00 PM?   ? ?Dr. Margarita Rana is supposed to send in another antibiotic for me because the 3 that were prescribed I'm allergic to.   My medicine was delivered today but the antibiotic wasn't in there.   Just my Ozempic and atorvastatin.   Did she call in the antibiotic? ? ?  ? ? ?Reason for Disposition ? [1] Caller has NON-URGENT medicine question about med that PCP prescribed AND [2] triager unable to answer question ? ?Answer Assessment - Initial Assessment Questions ?1. NAME of MEDICATION: "What medicine are you calling about?" ?   She is supposed to call in an antibiotic to replace the 3 antibiotics that were prescribed for me because I'm allergic to all 3 of them.   I did not get an antibiotic in my medication delivery today.   Did she call it in? ? ?Also wanted to know if she should take her dose of Ozempic tonight since she saw on her MyChart where her H. Pylori test is positive. ? ?Only uses [facility] now on [address]. ? ? ?2. QUESTION: "What is your question?" (e.g., double dose of medicine, side effect) ?    Did she call in the antibiotic? ? ?Should I take the Ozempic tonight since H. Pylori is positive? ? ? ?3. PRESCRIBING HCP: "Who prescribed it?" Reason: if prescribed by specialist, call should be referred to that group. ?    Dr. Charlott Rakes ?4. SYMPTOMS: "Do you have any symptoms?" ?    N/A ?5. SEVERITY: If symptoms are present, ask "Are they mild, moderate or severe?" ?    N/A ?6. PREGNANCY:  "Is there any chance that you are pregnant?" "When was your last menstrual period?" ?    Not asked ? ?Protocols used: Medication Question Call[facility] ? ?

## 2021-10-30 NOTE — Telephone Encounter (Signed)
Pt's pharmacy called to report that combination products for H pylori are not covered. Pt needs individual components called in. Will route to provider covering for her.  ?

## 2021-10-30 NOTE — Addendum Note (Signed)
Addended by: Karle Plumber B on: 10/30/2021 09:10 PM ? ? Modules accepted: Orders ? ?

## 2021-11-02 ENCOUNTER — Encounter: Payer: Self-pay | Admitting: Family Medicine

## 2021-11-07 ENCOUNTER — Ambulatory Visit (INDEPENDENT_AMBULATORY_CARE_PROVIDER_SITE_OTHER): Payer: Medicare Other | Admitting: Internal Medicine

## 2021-11-07 ENCOUNTER — Other Ambulatory Visit (INDEPENDENT_AMBULATORY_CARE_PROVIDER_SITE_OTHER): Payer: Medicare Other

## 2021-11-07 ENCOUNTER — Encounter: Payer: Self-pay | Admitting: Internal Medicine

## 2021-11-07 VITALS — BP 118/60 | HR 84 | Ht 62.0 in | Wt 184.0 lb

## 2021-11-07 DIAGNOSIS — R112 Nausea with vomiting, unspecified: Secondary | ICD-10-CM

## 2021-11-07 DIAGNOSIS — A048 Other specified bacterial intestinal infections: Secondary | ICD-10-CM | POA: Diagnosis not present

## 2021-11-07 DIAGNOSIS — R630 Anorexia: Secondary | ICD-10-CM

## 2021-11-07 LAB — CORTISOL: Cortisol, Plasma: 10.2 ug/dL

## 2021-11-07 LAB — TSH: TSH: 1.07 u[IU]/mL (ref 0.35–5.50)

## 2021-11-07 NOTE — Progress Notes (Signed)
? ?Chief Complaint: N&V, H pylori infection ? ?HPI : 40 year old female with history of SLE, DM, RA, seizures, schizoaffective disorder, and Raynaud's disease presents with N&V and H pylori infection ? ?She has been feeling really nauseated recently. She can be laughing or coughing and then develop spontaneous vomiting of bile.  She takes Zofran as needed to help with nausea.  She does not have much of an appetite. She has been told in the past that her nausea may be coming from her prior hysterectomy surgery. Even when she has the urge to eat, she won't be able to eat that much. This poor appetite was there even before she started her Ozempic medication. Sometimes she will have trouble swallowing, and is currently being evaluated for a thyroid nodule. She is struggling with the loss of her nephew (she raised him like a son) 2 years ago. She previously followed with a psychiatrist and therapist, but has had difficulty with scheduling appointments during [place]. She has been smoking marijuana to help with her anxiety issues, but this has not helped with her appetite. She was recently diagnosed with H pylori, but has not yet started her H pylori medications yet. She has been losing weight over time mainly due to Ozempic. She had a colonoscopy done a few years ago that showed a small polyp. She thinks that she had this procedure done at a GI practice on [address] across from [place]. Denies abdominal pain.  ? ?Wt Readings from Last 3 Encounters:  ?11/07/21 184 lb (83.5 kg)  ?10/10/21 187 lb 9.6 oz (85.1 kg)  ?05/11/21 200 lb 3.2 oz (90.8 kg)  ? ?Past Medical History:  ?Diagnosis Date  ? Anemia   ? no current med.  ? Anxiety   ? Chronic tonsillitis 03/2012  ? Depression   ? Diabetes mellitus   ? diet-controlled  ? Fibrocystic breast disease   ? Headache(784.0)   ? migraines  ? History of endometriosis   ? Hyperlipidemia   ? Hypertension   ? under control, has been on med. x 1 yr.  ? Lupus ([place])   ? Lupus ([place])   ?  Neuritis   ? Panic attacks   ? Raynauds disease   ? Rheumatoid arthritis(714.0)   ? Schizoaffective disorder ([place])   ? Seizures ([place])   ? last seizure > 1 yr. ago  ? Sinusitis   ? Sleep disturbance   ? Thyroid nodule   ? ? ? ?Past Surgical History:  ?Procedure Laterality Date  ? ABDOMINAL HYSTERECTOMY  6 yrs. ago  ? complete  ? CHOLECYSTECTOMY  6-8 yrs. ago  ? FOOT SURGERY  05/2011  ? right great toe  ? TONSILLECTOMY  04/22/2012  ? Procedure: TONSILLECTOMY;  Surgeon: Rozetta Nunnery, MD;  Location: [facility];  Service: ENT;  Laterality: N/A;  ? ?Family History  ?Problem Relation Age of Onset  ? Colon cancer Father   ? Heart attack Maternal Grandmother   ?     late 89's  ? Arthritis Maternal Grandfather   ? Cancer Maternal Grandfather   ?     lung  ? Diabetes Paternal Grandmother   ? Hypertension Paternal Grandmother   ? Kidney disease Paternal Grandmother   ? Lupus Other   ? Pancreatic cancer Neg Hx   ? Stomach cancer Neg Hx   ? Liver cancer Neg Hx   ? Esophageal cancer Neg Hx   ? ?Social History  ? ?Tobacco Use  ?  Smoking status: Former  ?  Packs/day: 0.25  ?  Years: 3.00  ?  Pack years: 0.75  ?  Types: Cigarettes  ?  Quit date: 01/2019  ?  Years since quitting: 2.7  ? Smokeless tobacco: Never  ? Tobacco comments:  ?  3 cig./day  ?Vaping Use  ? Vaping Use: Never used  ?Substance Use Topics  ? Alcohol use: Yes  ?  Alcohol/week: 0.0 standard drinks  ?  Comment: occasionally  ? Drug use: Yes  ?  Types: Marijuana  ? ?Current Outpatient Medications  ?Medication Sig Dispense Refill  ? amLODipine (NORVASC) 5 MG tablet Take 1 tablet (5 mg total) by mouth daily. 90 tablet 1  ? atorvastatin (LIPITOR) 20 MG tablet Take 1 tablet (20 mg total) by mouth daily. 30 tablet 6  ? butalbital-acetaminophen-caffeine (FIORICET) 50-325-40 MG tablet Take 1-2 tablets by mouth every 6 (six) hours as needed for headache. 20 tablet 0  ? DULoxetine (CYMBALTA) 60 MG capsule Take 1 capsule (60 mg total) by mouth daily. 30  capsule 6  ? EPINEPHrine (EPIPEN JR) 0.15 MG/0.3ML injection Inject 0.15 mg into the muscle as needed.    ? ibuprofen (ADVIL,MOTRIN) 200 MG tablet Take 200 mg by mouth every 6 (six) hours as needed for fever, headache, mild pain, moderate pain or cramping.    ? metFORMIN (GLUCOPHAGE) 500 MG tablet Take 1 tablet (500 mg total) by mouth daily with breakfast. 30 tablet 6  ? predniSONE (DELTASONE) 20 MG tablet Take 1 tablet (20 mg total) by mouth daily with breakfast. 5 tablet 0  ? pregabalin (LYRICA) 100 MG capsule Take 100 mg by mouth 2 (two) times daily.    ? propranolol (INDERAL) 20 MG tablet Take 2 tablets twice a day 120 tablet 2  ? Semaglutide,0.25 or 0.5MG/DOS, (OZEMPIC, 0.25 OR 0.5 MG/DOSE,) 2 MG/1.5ML SOPN Inject 0.5 mg into the skin once a week. 2 mL 6  ? tiZANidine (ZANAFLEX) 4 MG tablet Take 1 tablet (4 mg total) by mouth every 8 (eight) hours as needed for muscle spasms. 90 tablet 1  ? valACYclovir (VALTREX) 1000 MG tablet Take 1,000 mg by mouth daily.    ? Accu-Chek Softclix Lancets lancets Use to check blood sugar three times daily. E11.65 100 each 3  ? bismuth subsalicylate (PEPTO-BISMOL) 262 MG/15ML suspension 30 mls PO Q 6hrs x 10 days (Patient not taking: Reported on 11/07/2021) 360 mL 0  ? bismuth-metronidazole-tetracycline (PYLERA) 140-125-125 MG capsule Take 3 capsules by mouth 4 (four) times daily -  before meals and at bedtime. (Patient not taking: Reported on 11/07/2021) 168 capsule 0  ? Blood Glucose Monitoring Suppl (ACCU-CHEK GUIDE ME) w/Device KIT Use to check blood sugar three times daily. E11.65 1 kit 0  ? glucose blood (ACCU-CHEK GUIDE) test strip Use to check blood sugar three times daily. E11.65 100 each 3  ? hydroxychloroquine (PLAQUENIL) 200 MG tablet Take 200 mg by mouth 2 (two) times daily. (Patient not taking: Reported on 11/07/2021)    ? metroNIDAZOLE (FLAGYL) 250 MG tablet Take 1 tablet (250 mg total) by mouth 4 (four) times daily. (Patient not taking: Reported on 11/07/2021) 40  tablet 0  ? omeprazole (PRILOSEC) 20 MG capsule Take 1 capsule (20 mg total) by mouth 2 (two) times daily before a meal. (Patient not taking: Reported on 11/07/2021) 28 capsule 0  ? tetracycline (SUMYCIN) 500 MG capsule Take 1 capsule (500 mg total) by mouth 4 (four) times daily. (Patient not taking: Reported on 11/07/2021) 40  capsule 0  ? ?No current facility-administered medications for this visit.  ? ?Allergies  ?Allergen Reactions  ? Amoxicillin Hives and Rash  ?  Has patient had a PCN reaction causing immediate rash, facial/tongue/throat swelling, SOB or lightheadedness with hypotension: Yes ?Has patient had a PCN reaction causing severe rash involving mucus membranes or skin necrosis: Yes ?Has patient had a PCN reaction that required hospitalization: No ?Has patient had a PCN reaction occurring within the last 10 years: No ?If all of the above answers are "NO", then may proceed with Cephalosporin use. ?  ? Azithromycin Shortness Of Breath and Rash  ? Darvocet [Propoxyphene N-Acetaminophen] Shortness Of Breath and Rash  ? Grapeseed Extract [Nutritional Supplements] Shortness Of Breath, Swelling and Rash  ?  GRAPES  ? Kiwi Extract Shortness Of Breath, Swelling and Rash  ? Latex Shortness Of Breath  ? Macrolides And Ketolides Shortness Of Breath and Rash  ? Morphine And Related Shortness Of Breath, Rash and Other (See Comments)  ?  MUSCLE RIGIDITY  ? Sulfa Antibiotics Shortness Of Breath  ?  MUSCLE RIGIDITY  ? Klonopin [Clonazepam] Rash and Other (See Comments)  ?  HOMICIDAL THOUGHTS  ? Penicillins Itching and Swelling  ?  Swelling of mouth ?Has patient had a PCN reaction causing immediate rash, facial/tongue/throat swelling, SOB or lightheadedness with hypotension: Yes ?Has patient had a PCN reaction causing severe rash involving mucus membranes or skin necrosis: No ?Has patient had a PCN reaction that required hospitalization: No ?Has patient had a PCN reaction occurring within the last 10 years: No ?If all of  the above answers are "NO", then may proceed with Cephalosporin use. ?  ? Zoloft [Sertraline Hcl] Other (See Comments)  ?  TARDIVE DYSKINESIA  ? Contrast Media [Iodinated Contrast Media] Swelling  ?  Pt CAN N

## 2021-11-07 NOTE — Patient Instructions (Addendum)
If you are age 41 or older, your body mass index should be between 23-30. Your Body mass index is 33.65 kg/m?Marland Kitchen If this is out of the aforementioned range listed, please consider follow up with your Primary Care Provider. ? ?If you are age 38 or younger, your body mass index should be between 19-25. Your Body mass index is 33.65 kg/m?Marland Kitchen If this is out of the aformentioned range listed, please consider follow up with your Primary Care Provider.  ? ?Your provider has requested that you go to the basement level for lab work before leaving today. Press "B" on the elevator. The lab is located at the first door on the left as you exit the elevator. ? ?We have sent a referral to [facility] health for grief and they will call you to make an appointment. ? ?Avoid Marijuana use. ? ?Please bring H pylori stool sample back in 4 weeks. ? ? ?The [facility] providers would like to encourage you to use [facility] to communicate with providers for non-urgent requests or questions.  Due to long hold times on the telephone, sending your provider a message by [facility] may be a faster and more efficient way to get a response.  Please allow 48 business hours for a response.  Please remember that this is for non-urgent requests.  ? ?It was a pleasure to see you today! ? ?Thank you for trusting me with your gastrointestinal care!   ? ?Christia Reading, MD  ? ?

## 2021-11-08 LAB — TISSUE TRANSGLUTAMINASE, IGA: (tTG) Ab, IgA: <1 U/mL

## 2021-11-08 LAB — IGA: Immunoglobulin A: 384 mg/dL — ABNORMAL HIGH (ref 47–310)

## 2021-11-16 ENCOUNTER — Ambulatory Visit
Admission: RE | Admit: 2021-11-16 | Discharge: 2021-11-16 | Disposition: A | Discharge status: Home / Self Care | Payer: Medicare Other | Source: Ambulatory Visit | Attending: Obstetrics and Gynecology | Admitting: Obstetrics and Gynecology

## 2021-11-16 ENCOUNTER — Other Ambulatory Visit: Payer: Self-pay | Admitting: Obstetrics and Gynecology

## 2021-11-16 DIAGNOSIS — E2839 Other primary ovarian failure: Secondary | ICD-10-CM

## 2021-11-27 ENCOUNTER — Other Ambulatory Visit: Payer: Self-pay | Admitting: Family Medicine

## 2021-11-28 NOTE — Telephone Encounter (Signed)
Requested Prescriptions  ?Pending Prescriptions Disp Refills  ?? propranolol (INDERAL) 20 MG tablet [Pharmacy Med Name: propranolol 20 mg tablet] 120 tablet 2  ?  Sig: Take 2 tablets twice a day  ?  ? Cardiovascular:  Beta Blockers Passed - 11/27/2021  3:47 PM  ?  ?  Passed - Last BP in normal range  ?  BP Readings from Last 1 Encounters:  ?11/07/21 118/60  ?   ?  ?  Passed - Last Heart Rate in normal range  ?  Pulse Readings from Last 1 Encounters:  ?11/07/21 84  ?   ?  ?  Passed - Valid encounter within last 6 months  ?  Recent Outpatient Visits   ?      ? 1 month ago Type 2 diabetes mellitus with other specified complication, without long-term current use of insulin ([place])  ? [place], Charlane Ferretti, MD  ? 6 months ago Rheumatoid arthritis involving multiple sites, unspecified whether rheumatoid factor present [facility])  ? [place], Charlane Ferretti, MD  ? 7 months ago Other microscopic hematuria  ? [place], Charlane Ferretti, MD  ? 9 months ago Type 2 diabetes mellitus with other specified complication, without long-term current use of insulin ([place])  ? [place], Charlane Ferretti, MD  ? 1 year ago Urinary symptom or sign  ? [place] Charlott Rakes, MD  ?  ?  ?Future Appointments   ?        ? In 1 month Charlott Rakes, MD [place]  ?  ? ?  ?  ?  ? ?

## 2021-12-05 ENCOUNTER — Other Ambulatory Visit: Payer: Medicare Other

## 2021-12-05 DIAGNOSIS — R112 Nausea with vomiting, unspecified: Secondary | ICD-10-CM

## 2021-12-05 DIAGNOSIS — A048 Other specified bacterial intestinal infections: Secondary | ICD-10-CM

## 2021-12-05 DIAGNOSIS — R630 Anorexia: Secondary | ICD-10-CM

## 2021-12-06 ENCOUNTER — Other Ambulatory Visit: Payer: Self-pay | Admitting: Family Medicine

## 2021-12-07 LAB — H. PYLORI ANTIGEN, STOOL: H pylori Ag, Stl: NEGATIVE

## 2021-12-12 ENCOUNTER — Other Ambulatory Visit: Payer: Self-pay | Admitting: Family Medicine

## 2021-12-12 DIAGNOSIS — I10 Essential (primary) hypertension: Secondary | ICD-10-CM

## 2022-01-16 ENCOUNTER — Encounter: Payer: Self-pay | Admitting: Family Medicine

## 2022-01-16 ENCOUNTER — Ambulatory Visit: Payer: Medicare HMO | Attending: Family Medicine | Admitting: Family Medicine

## 2022-01-16 VITALS — BP 107/75 | HR 85 | Temp 98.3°F | Ht 62.0 in | Wt 176.0 lb

## 2022-01-16 DIAGNOSIS — I10 Essential (primary) hypertension: Secondary | ICD-10-CM

## 2022-01-16 DIAGNOSIS — E1169 Type 2 diabetes mellitus with other specified complication: Secondary | ICD-10-CM | POA: Diagnosis not present

## 2022-01-16 DIAGNOSIS — M79672 Pain in left foot: Secondary | ICD-10-CM | POA: Diagnosis not present

## 2022-01-16 DIAGNOSIS — M797 Fibromyalgia: Secondary | ICD-10-CM

## 2022-01-16 DIAGNOSIS — M069 Rheumatoid arthritis, unspecified: Secondary | ICD-10-CM | POA: Diagnosis not present

## 2022-01-16 LAB — POCT GLYCOSYLATED HEMOGLOBIN (HGB A1C): HbA1c, POC (controlled diabetic range): 5.7 % (ref 0.0–7.0)

## 2022-01-16 LAB — GLUCOSE, POCT (MANUAL RESULT ENTRY): POC Glucose: 98 mg/dl (ref 70–99)

## 2022-01-16 MED ORDER — OZEMPIC (0.25 OR 0.5 MG/DOSE) 2 MG/1.5ML ~~LOC~~ SOPN: 0.5000 mg | PEN_INJECTOR | SUBCUTANEOUS | 6 refills | Status: DC

## 2022-01-16 MED ORDER — AMLODIPINE BESYLATE 5 MG PO TABS: 5.0000 mg | ORAL_TABLET | Freq: Every day | ORAL | 1 refills | Status: DC

## 2022-01-16 MED ORDER — METFORMIN HCL 500 MG PO TABS: 500.0000 mg | ORAL_TABLET | Freq: Every day | ORAL | 1 refills | Status: DC

## 2022-01-16 MED ORDER — DULOXETINE HCL 60 MG PO CPEP: 60.0000 mg | ORAL_CAPSULE | Freq: Every day | ORAL | 1 refills | Status: DC

## 2022-01-16 MED ORDER — ATORVASTATIN CALCIUM 20 MG PO TABS: 20.0000 mg | ORAL_TABLET | Freq: Every day | ORAL | 1 refills | Status: DC

## 2022-01-16 MED ORDER — TIZANIDINE HCL 4 MG PO TABS: 4.0000 mg | ORAL_TABLET | Freq: Three times a day (TID) | ORAL | 1 refills | Status: DC | PRN

## 2022-01-25 DIAGNOSIS — M7062 Trochanteric bursitis, left hip: Secondary | ICD-10-CM | POA: Diagnosis not present

## 2022-01-25 DIAGNOSIS — M1991 Primary osteoarthritis, unspecified site: Secondary | ICD-10-CM | POA: Diagnosis not present

## 2022-01-25 DIAGNOSIS — Z6832 Body mass index (BMI) 32.0-32.9, adult: Secondary | ICD-10-CM | POA: Diagnosis not present

## 2022-01-25 DIAGNOSIS — M7989 Other specified soft tissue disorders: Secondary | ICD-10-CM | POA: Diagnosis not present

## 2022-01-25 DIAGNOSIS — R5382 Chronic fatigue, unspecified: Secondary | ICD-10-CM | POA: Diagnosis not present

## 2022-01-25 DIAGNOSIS — L659 Nonscarring hair loss, unspecified: Secondary | ICD-10-CM | POA: Diagnosis not present

## 2022-01-25 DIAGNOSIS — M7061 Trochanteric bursitis, right hip: Secondary | ICD-10-CM | POA: Diagnosis not present

## 2022-01-25 DIAGNOSIS — M797 Fibromyalgia: Secondary | ICD-10-CM | POA: Diagnosis not present

## 2022-01-25 DIAGNOSIS — E669 Obesity, unspecified: Secondary | ICD-10-CM | POA: Diagnosis not present

## 2022-01-25 DIAGNOSIS — M359 Systemic involvement of connective tissue, unspecified: Secondary | ICD-10-CM | POA: Diagnosis not present

## 2022-01-29 ENCOUNTER — Other Ambulatory Visit: Payer: Self-pay | Admitting: Family Medicine

## 2022-02-12 ENCOUNTER — Encounter (HOSPITAL_COMMUNITY): Payer: Self-pay | Admitting: Psychiatry

## 2022-02-12 ENCOUNTER — Ambulatory Visit (HOSPITAL_BASED_OUTPATIENT_CLINIC_OR_DEPARTMENT_OTHER): Payer: Medicare HMO | Admitting: Psychiatry

## 2022-02-12 VITALS — Wt 170.0 lb

## 2022-02-12 DIAGNOSIS — R69 Illness, unspecified: Secondary | ICD-10-CM | POA: Diagnosis not present

## 2022-02-12 DIAGNOSIS — F250 Schizoaffective disorder, bipolar type: Secondary | ICD-10-CM

## 2022-02-12 DIAGNOSIS — F4312 Post-traumatic stress disorder, chronic: Secondary | ICD-10-CM

## 2022-02-12 MED ORDER — QUETIAPINE FUMARATE 50 MG PO TABS: 50.0000 mg | ORAL_TABLET | Freq: Two times a day (BID) | ORAL | 2 refills | Status: DC
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
## 2022-02-13 DIAGNOSIS — N952 Postmenopausal atrophic vaginitis: Secondary | ICD-10-CM | POA: Diagnosis not present

## 2022-02-13 DIAGNOSIS — N9089 Other specified noninflammatory disorders of vulva and perineum: Secondary | ICD-10-CM | POA: Diagnosis not present

## 2022-03-05 ENCOUNTER — Other Ambulatory Visit: Payer: Self-pay | Admitting: Family Medicine

## 2022-03-13 ENCOUNTER — Encounter (HOSPITAL_COMMUNITY): Payer: Self-pay | Admitting: Psychiatry

## 2022-03-13 ENCOUNTER — Telehealth (HOSPITAL_BASED_OUTPATIENT_CLINIC_OR_DEPARTMENT_OTHER): Payer: Medicare HMO | Admitting: Psychiatry

## 2022-03-13 DIAGNOSIS — F250 Schizoaffective disorder, bipolar type: Secondary | ICD-10-CM | POA: Diagnosis not present

## 2022-03-13 DIAGNOSIS — F4312 Post-traumatic stress disorder, chronic: Secondary | ICD-10-CM

## 2022-03-13 DIAGNOSIS — R69 Illness, unspecified: Secondary | ICD-10-CM | POA: Diagnosis not present

## 2022-03-13 MED ORDER — QUETIAPINE FUMARATE 25 MG PO TABS: 25.0000 mg | ORAL_TABLET | Freq: Every day | ORAL | 0 refills | Status: DC

## 2022-03-13 NOTE — Progress Notes (Signed)
Virtual Visit via Video Note  I connected with Victoria Holmes on 03/13/22 at  8:20 AM EDT by a video enabled telemedicine application and verified that I am speaking with the correct person using two identifiers.  Location: Patient: Home Provider: Home Office   I discussed the limitations of evaluation and management by telemedicine and the availability of in person appointments. The patient expressed understanding and agreed to proceed.  History of Present Illness: Patient is 41 year old African-American, single female who was seen first time 6 weeks ago as she is referred from her primary care physician for the management of her psychiatric symptoms.  She had a diagnosis of PTSD, schizoaffective disorder which lately intensified.  She had multiple stressors including afraid of being homeless, limited social support, financial strain and history of losing her 62 days old child and nephew committed suicide in 2021.  Her biggest concern and stress is moving and finding a new place.  Her landlord decided not to accept section 8 and she could not find a place in [facility].  She had tried approaching [employer], church, social services, urban ministry that so far no luck and she was told they were out of parents.  Patient told she is never been homeless since age 22 but now she feels that it will be first time she will be homeless.  She has not done packing because she is constantly thinking about her future.  She has nightmares, flashback, chronic hallucination.  We started her on Seroquel because she had good response with the Seroquel in the past but higher dose made her very groggy.  She took 50 mg Seroquel and that helps her in the beginning but she was sleeping too much and could not function next day.  Not she had cut down her Seroquel and not taking consistently because she has to pack things.  Her neighbor sometimes helps but she has no other resources.  During the session she appears very  anxious, tearful, labile and having crying spells.  She is taking Cymbalta consistently now.  She denies any homicidal thoughts or suicidal thoughts.  She admitted getting irritable and angry and frustrated when she think about her living situation.  She has appointment to see her therapist on November 6.  She admitted smoking marijuana to help her pain, IBS, nausea and helps the appetite.  Her weight is unchanged from the past.  Patient told she like to focus on her treatment but not sure if she can focus because her priority is her living situation.  Other than feeling groggy she has no other side effects from Seroquel.  She has no tremors, shakes or any EPS.  She does not go outside because of paranoia unless she has to go to the grocery store.  Past Psychiatric History: H/O PTSD, schizoaffective disorder, and bipolar disorder.  H/O cutting wrist and neck in 2007 and multiple inpatient.  Seroquel helped but higher doses made groggy. Tried Depakote but no details. Tried Ativan and Xanax.  Zoloft, Klonopin had side effects.  As per chart she also had tried Prozac, Concerta, Lexapro.  Has seen family services of [place] and [place].  Psychiatric Specialty Exam: Physical Exam  Review of Systems  Weight 170 lb (77.1 kg).There is no height or weight on file to calculate BMI.  General Appearance: Fairly Groomed and tearful  Eye Contact:  Fair  Speech:  Pressured  Volume:  Decreased  Mood:  Anxious, Dysphoric, and Irritable  Affect:  Labile  Thought  Process:  Descriptions of Associations: Intact  Orientation:  Full (Time, Place, and Person)  Thought Content:  Paranoid Ideation and Rumination  Suicidal Thoughts:  No  Homicidal Thoughts:  No  Memory:  Immediate;   Fair Recent;   Fair Remote;   Fair  Judgement:  Fair  Insight:  Shallow  Psychomotor Activity:  Increased  Concentration:  Concentration: Fair and Attention Span: Fair  Recall:  [employer] of Knowledge:  Fair  Language:  Good   Akathisia:  No  Handed:  Right  AIMS (if indicated):     Assets:  Communication Skills Desire for Improvement Transportation  ADL's:  Intact  Cognition:  WNL  Sleep:   Seroquel help      Assessment and Plan: Schizoaffective disorder, bipolar type.  PTSD.  Mild cannabis use.  Patient was taking Seroquel 50 mg at bedtime until.  Days ago which was helping her mood.  She admitted not consistent lately because she worried about her moving and packing.  She is not sure where she will go.  She has few days from the landlord.  She is afraid and does not want to be homeless.  I recommend should contact section 8 housing to find a place for her as patient approved from them more than a year ago.  We talk about reducing the Seroquel to take 25 mg if the 50 mg making her very groggy.  She agreed because she like Seroquel.  I recommend if 25 mg does not help as much then she can take up to 50 mg.  I also encouraged take the Cymbalta 60 mg daily as prescribed by PCP to help her fibromyalgia and chronic pain.  We talk about cannabis use but patient at this time feels it is helping her chronic pain and IBS.  I encouraged to keep appointment with Margreta Journey on November 6.  Follow-up in 4 to 6 weeks.  Recommended to call us back if she has any question or any concern.  Discussed safety concerns at any time having active suicidal thoughts or homicidal thought that she need to call 911 or go to local emergency room.    Follow Up Instructions:    I discussed the assessment and treatment plan with the patient. The patient was provided an opportunity to ask questions and all were answered. The patient agreed with the plan and demonstrated an understanding of the instructions.   The patient was advised to call back or seek an in-person evaluation if the symptoms worsen or if the condition fails to improve as anticipated.  Collaboration of Care: Other provider involved in patient's care AEB notes are available  in epic to review  Patient/Guardian was advised Release of Information must be obtained prior to any record release in order to collaborate their care with an outside provider. Patient/Guardian was advised if they have not already done so to contact the registration department to sign all necessary forms in order for [place] to release information regarding their care.   Consent: Patient/Guardian gives verbal consent for treatment and assignment of benefits for services provided during this visit. Patient/Guardian expressed understanding and agreed to proceed.    I provided 36 minutes of non-face-to-face time during this encounter.   Kathlee Nations, MD

## 2022-03-15 DIAGNOSIS — I10 Essential (primary) hypertension: Secondary | ICD-10-CM | POA: Diagnosis not present

## 2022-03-15 DIAGNOSIS — F4321 Adjustment disorder with depressed mood: Secondary | ICD-10-CM | POA: Diagnosis not present

## 2022-03-15 DIAGNOSIS — E1140 Type 2 diabetes mellitus with diabetic neuropathy, unspecified: Secondary | ICD-10-CM | POA: Diagnosis not present

## 2022-03-15 DIAGNOSIS — R69 Illness, unspecified: Secondary | ICD-10-CM | POA: Diagnosis not present

## 2022-03-15 DIAGNOSIS — F411 Generalized anxiety disorder: Secondary | ICD-10-CM | POA: Diagnosis not present

## 2022-03-15 DIAGNOSIS — M1990 Unspecified osteoarthritis, unspecified site: Secondary | ICD-10-CM | POA: Diagnosis not present

## 2022-03-15 DIAGNOSIS — E785 Hyperlipidemia, unspecified: Secondary | ICD-10-CM | POA: Diagnosis not present

## 2022-03-15 DIAGNOSIS — F4310 Post-traumatic stress disorder, unspecified: Secondary | ICD-10-CM | POA: Diagnosis not present

## 2022-03-15 DIAGNOSIS — B009 Herpesviral infection, unspecified: Secondary | ICD-10-CM | POA: Diagnosis not present

## 2022-03-15 DIAGNOSIS — F259 Schizoaffective disorder, unspecified: Secondary | ICD-10-CM | POA: Diagnosis not present

## 2022-03-15 DIAGNOSIS — F329 Major depressive disorder, single episode, unspecified: Secondary | ICD-10-CM | POA: Diagnosis not present

## 2022-03-15 DIAGNOSIS — M797 Fibromyalgia: Secondary | ICD-10-CM | POA: Diagnosis not present

## 2022-03-15 DIAGNOSIS — E669 Obesity, unspecified: Secondary | ICD-10-CM | POA: Diagnosis not present

## 2022-03-28 ENCOUNTER — Ambulatory Visit (INDEPENDENT_AMBULATORY_CARE_PROVIDER_SITE_OTHER): Payer: Medicare HMO | Admitting: Licensed Clinical Social Worker

## 2022-03-28 DIAGNOSIS — F4312 Post-traumatic stress disorder, chronic: Secondary | ICD-10-CM

## 2022-03-28 DIAGNOSIS — F250 Schizoaffective disorder, bipolar type: Secondary | ICD-10-CM

## 2022-03-28 DIAGNOSIS — R69 Illness, unspecified: Secondary | ICD-10-CM | POA: Diagnosis not present

## 2022-03-28 NOTE — Plan of Care (Signed)
Developed tx plan based on pt self reported input. Pt verbally agrees with treatment plan at time of session  

## 2022-03-30 ENCOUNTER — Encounter (HOSPITAL_COMMUNITY): Payer: Self-pay
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
## 2022-04-02 ENCOUNTER — Emergency Department (HOSPITAL_COMMUNITY): Payer: Medicare HMO

## 2022-04-02 ENCOUNTER — Emergency Department (HOSPITAL_COMMUNITY)
Admission: EM | Admit: 2022-04-02 | Discharge: 2022-04-02 | Discharge status: Against Medical Advice | Payer: Medicare HMO | Attending: Emergency Medicine | Admitting: Emergency Medicine

## 2022-04-02 DIAGNOSIS — Z5321 Procedure and treatment not carried out due to patient leaving prior to being seen by health care provider: Secondary | ICD-10-CM | POA: Diagnosis not present

## 2022-04-02 DIAGNOSIS — F410 Panic disorder [episodic paroxysmal anxiety] without agoraphobia: Secondary | ICD-10-CM | POA: Diagnosis not present

## 2022-04-02 DIAGNOSIS — R079 Chest pain, unspecified: Secondary | ICD-10-CM | POA: Diagnosis not present

## 2022-04-02 DIAGNOSIS — R0789 Other chest pain: Secondary | ICD-10-CM | POA: Diagnosis not present

## 2022-04-02 DIAGNOSIS — R69 Illness, unspecified: Secondary | ICD-10-CM | POA: Diagnosis not present

## 2022-04-02 LAB — CBC WITH DIFFERENTIAL/PLATELET
Abs Immature Granulocytes: 0 10*3/uL (ref 0.00–0.07)
Basophils Absolute: 0 10*3/uL (ref 0.0–0.1)
Basophils Relative: 1 %
Eosinophils Absolute: 0.1 10*3/uL (ref 0.0–0.5)
Eosinophils Relative: 2 %
HCT: 41.6 % (ref 36.0–46.0)
Hemoglobin: 13.9 g/dL (ref 12.0–15.0)
Immature Granulocytes: 0 %
Lymphocytes Relative: 60 %
Lymphs Abs: 3.2 10*3/uL (ref 0.7–4.0)
MCH: 26.6 pg (ref 26.0–34.0)
MCHC: 33.4 g/dL (ref 30.0–36.0)
MCV: 79.7 fL — ABNORMAL LOW (ref 80.0–100.0)
Monocytes Absolute: 0.4 10*3/uL (ref 0.1–1.0)
Monocytes Relative: 7 %
Neutro Abs: 1.6 10*3/uL — ABNORMAL LOW (ref 1.7–7.7)
Neutrophils Relative %: 30 %
Platelets: 246 10*3/uL (ref 150–400)
RBC: 5.22 MIL/uL — ABNORMAL HIGH (ref 3.87–5.11)
RDW: 14.6 % (ref 11.5–15.5)
WBC: 5.3 10*3/uL (ref 4.0–10.5)
nRBC: 0 % (ref 0.0–0.2)

## 2022-04-02 LAB — BASIC METABOLIC PANEL
Anion gap: 12 (ref 5–15)
BUN: 7 mg/dL (ref 6–20)
CO2: 21 mmol/L — ABNORMAL LOW (ref 22–32)
Calcium: 10 mg/dL (ref 8.9–10.3)
Chloride: 107 mmol/L (ref 98–111)
Creatinine, Ser: 0.87 mg/dL (ref 0.44–1.00)
GFR, Estimated: >60 mL/min (ref >60)
Glucose, Bld: 114 mg/dL — ABNORMAL HIGH (ref 70–99)
Potassium: 3.4 mmol/L — ABNORMAL LOW (ref 3.5–5.1)
Sodium: 140 mmol/L (ref 135–145)

## 2022-04-02 LAB — TROPONIN I (HIGH SENSITIVITY): Troponin I (High Sensitivity): 3 ng/L (ref <18)
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
## 2022-04-02 NOTE — ED Triage Notes (Signed)
Patient reports anxiety / panic attack this evening with body " tingling and locking" , respirations unlabored , denies pain . Alert and oriented .

## 2022-04-02 NOTE — ED Notes (Signed)
Patient states she feels better and she is leaving

## 2022-04-13 ENCOUNTER — Other Ambulatory Visit (HOSPITAL_COMMUNITY): Payer: Self-pay | Admitting: Psychiatry

## 2022-04-13 DIAGNOSIS — F250 Schizoaffective disorder, bipolar type: Secondary | ICD-10-CM

## 2022-04-13 DIAGNOSIS — F4312 Post-traumatic stress disorder, chronic: Secondary | ICD-10-CM

## 2022-04-16 ENCOUNTER — Ambulatory Visit: Payer: Self-pay | Admitting: *Deleted

## 2022-04-16 NOTE — Telephone Encounter (Signed)
Summary: discuss blood sugar, body feel funny   Pt called in to speak with NT. Pt says that she is was recently in the hospital and is concerned about her blood sugar. Pt says that her last reading as of a few minutes ago was 103 but pt says that her body feel funny. Pt says that she would like to discuss this with a nurse further.       Chief Complaint: glucose low Symptoms: holding Ozempic and Metformin and still running low Frequency: has checked multiple times today. Held Metformin two days but took today, having tingling, body cramping up Pertinent Negatives: Patient denies na Disposition: '[]'$ ED /'[]'$ Urgent Care (no appt availability in office) / '[]'$ Appointment(In office/virtual)/ '[x]'$  [place] [facility]/ '[]'$ Home Care/ '[]'$ Refused Recommended Disposition /'[]'$ [facility]/ '[]'$  Follow-up with PCP Additional Notes: Recommended [place] [facility] visit, pt going to schedule it herself. Pt is extremely anxious. Has psychiatric appt Wed. Home care reviewed.  Reason for Disposition  [1] Blood glucose 70 mg/dl (3.9 mmol/l) or below, OR symptomatic AND [2] cause known  Answer Assessment - Initial Assessment Questions 1. SYMPTOMS: "What symptoms are you concerned about?"     Glucose is low, feels tingling 2. ONSET:  "When did the symptoms start?"     Last week 3. BLOOD GLUCOSE: "What is your blood glucose level?"      103 after eating lunch, had taken Metformin today. 84 one morning 4. USUAL RANGE: "What is your blood glucose level usually?" (e.g., usual fasting morning value, usual evening value)     Usually 95 but not feel funny Held Ozempic this week, did not take Metformin for two days, took today and now it is low 5. TYPE 1 or 2:  "Do you know what type of diabetes you have?"  (e.g., Type 1, Type 2, Gestational; doesn't know)      Type 2 6. INSULIN: "Do you take insulin?" "What type of insulin(s) do you use? What is the mode of delivery? (syringe, pen; injection or pump) "When did you  last give yourself an insulin dose?" (i.e., time or hours/minutes ago) "How much did you give?" (i.e., how many units)     Metformin, and Ozempic 7. DIABETES PILLS: "Do you take any pills for your diabetes?" If Yes, ask: "What is the name of the medicine(s) that you take for high blood sugar?"     Metformin 8. OTHER SYMPTOMS: "Do you have any symptoms?" (e.g., fever, frequent urination, difficulty breathing, vomiting)     Anxious, body locks up 9. LOW BLOOD GLUCOSE TREATMENT: "What have you done so far to treat the low blood glucose level?"     Eating protein drinking juice. 10. FOOD: "When did you last eat or drink?"       This afternoon, all of this is triggering anxiety 11. ALONE: "Are you alone right now or is someone with you?"        Lives alone 12. PREGNANCY: "Is there any chance you are pregnant?" "When was your last menstrual period?"       no  Protocols used: Diabetes - Low Blood Sugar-A-AH

## 2022-04-18 ENCOUNTER — Telehealth (HOSPITAL_BASED_OUTPATIENT_CLINIC_OR_DEPARTMENT_OTHER): Payer: Medicare HMO | Admitting: Psychiatry

## 2022-04-18 ENCOUNTER — Other Ambulatory Visit: Payer: Self-pay | Admitting: Family Medicine

## 2022-04-18 ENCOUNTER — Encounter (HOSPITAL_COMMUNITY): Payer: Self-pay | Admitting: Psychiatry

## 2022-04-18 VITALS — Wt 170.0 lb

## 2022-04-18 DIAGNOSIS — F250 Schizoaffective disorder, bipolar type: Secondary | ICD-10-CM

## 2022-04-18 DIAGNOSIS — F4312 Post-traumatic stress disorder, chronic: Secondary | ICD-10-CM

## 2022-04-18 DIAGNOSIS — R69 Illness, unspecified: Secondary | ICD-10-CM | POA: Diagnosis not present

## 2022-04-18 DIAGNOSIS — M797 Fibromyalgia: Secondary | ICD-10-CM

## 2022-04-18 DIAGNOSIS — F1210 Cannabis abuse, uncomplicated: Secondary | ICD-10-CM | POA: Diagnosis not present

## 2022-04-18 MED ORDER — BUSPIRONE HCL 5 MG PO TABS: 5.0000 mg | ORAL_TABLET | Freq: Two times a day (BID) | ORAL | 1 refills | Status: DC

## 2022-04-18 MED ORDER — QUETIAPINE FUMARATE 50 MG PO TABS: 50.0000 mg | ORAL_TABLET | Freq: Every day | ORAL | 1 refills | Status: DC

## 2022-04-18 NOTE — Telephone Encounter (Signed)
I am not sure what she means by 'funny'.  Please obtain additional details from her and her blood sugar readings.  Her last A1c was 5.7 three months ago.  An office visit can be scheduled for her.  Thanks.

## 2022-04-18 NOTE — Progress Notes (Signed)
Virtual Visit via Video Note  I connected with Victoria Holmes on 04/18/22 at  8:40 AM EDT by a video enabled telemedicine application and verified that I am speaking with the correct person using two identifiers.  Location: Patient: Home Provider: Home Office   I discussed the limitations of evaluation and management by telemedicine and the availability of in person appointments. The patient expressed understanding and agreed to proceed.  History of Present Illness: Patient is evaluated by video session.  She reported have a panic attack on September 11 and visited emergency room but she was not happy because no one helped her.  Patient told she was lying on the bed and no one bother to see her until she decided to leave the emergency room.  I reviewed notes from the emergency room she has vitals and blood work and cardiac troponin level.  Patient reported chronic stressors which she described mostly living situation and financial issues.  She reported that no one giving medication for panic attack and she like to have Ativan because that helps her.  She still smoke marijuana but minimizes her intake and does not believe that she should stopped.  Her main stressor is living situation as patient has watch her until October 31 after that she has to leave the place.  Her current landlord does not accept section 8 vouchers.  We will start therapy with [facility].  She reported flashbacks, nightmares, irritability.  She also reported some time chronic hallucinations but denies any suicidal thoughts or homicidal thoughts.  Her thought processes circumstantial.  She like to talk about her living situation and resources mostly in the session.  Patient told she cannot even sell her plasma because of the autoimmune and does not have resources to find a place.  Patient also showed paper that has a lot of notes which look like scribble that she is trying to reach [employer], [employer], social services and urban  ministry.  Patient told that all these places want her to be evicted first before the can help her.  During the session she was tearful, labile and irritable because of her living situation.  She does not want to talk about her psychiatric symptoms but very interested to get the Ativan.  She is not sure if she will continue therapy with Margre[facility] because she felt no one helped her to resolve her living situation.  Past Psychiatric History: H/O PTSD, schizoaffective disorder, and bipolar disorder.  H/O cutting wrist and neck in 2007 and multiple inpatient.  Seroquel helped but higher doses made groggy. Tried Depakote but no details. Tried Ativan and Xanax.  Zoloft, Klonopin had side effects.  As per chart she also had tried Prozac, Concerta, Lexapro.  Has seen family services of [place] and [place].  Recent Results (from the past 2160 hour(s))  Troponin I (High Sensitivity)     Status: None   Collection Time: 04/02/22  9:10 PM  Result Value Ref Range   Troponin I (High Sensitivity) 3 <18 ng/L    Comment: (NOTE) Elevated high sensitivity troponin I (hsTnI) values and significant  changes across serial measurements may suggest ACS but many other  chronic and acute conditions are known to elevate hsTnI results.  Refer to the "Links" section for chest pain algorithms and additional  guidance. Performed at [facility], [place] [address] [place], [place] [address]   Basic metabolic panel     Status: Abnormal   Collection Time: 04/02/22  9:10 PM  Result Value Ref Range   Sodium 140 135 - 145 mmol/L   Potassium 3.4 (L) 3.5 - 5.1 mmol/L   Chloride 107 98 - 111 mmol/L   CO2 21 (L) 22 - 32 mmol/L   Glucose, Bld 114 (H) 70 - 99 mg/dL    Comment: Glucose reference range applies only to samples taken after fasting for at least 8 hours.   BUN 7 6 - 20 mg/dL   Creatinine, Ser 0.87 0.44 - 1.00 mg/dL   Calcium 10.0 8.9 - 10.3 mg/dL   GFR, Estimated >60 >60 mL/min    Comment:  (NOTE) Calculated using the CKD-EPI Creatinine Equation (2021)    Anion gap 12 5 - 15    Comment: Performed at [facility] [address], [place], [place] [address]  CBC with Differential     Status: Abnormal   Collection Time: 04/02/22  9:10 PM  Result Value Ref Range   WBC 5.3 4.0 - 10.5 K/uL   RBC 5.22 (H) 3.87 - 5.11 MIL/uL   Hemoglobin 13.9 12.0 - 15.0 g/dL   HCT 41.6 36.0 - 46.0 %   MCV 79.7 (L) 80.0 - 100.0 fL   MCH 26.6 26.0 - 34.0 pg   MCHC 33.4 30.0 - 36.0 g/dL   RDW 14.6 11.5 - 15.5 %   Platelets 246 150 - 400 K/uL   nRBC 0.0 0.0 - 0.2 %   Neutrophils Relative % 30 %   Neutro Abs 1.6 (L) 1.7 - 7.7 K/uL   Lymphocytes Relative 60 %   Lymphs Abs 3.2 0.7 - 4.0 K/uL   Monocytes Relative 7 %   Monocytes Absolute 0.4 0.1 - 1.0 K/uL   Eosinophils Relative 2 %   Eosinophils Absolute 0.1 0.0 - 0.5 K/uL   Basophils Relative 1 %   Basophils Absolute 0.0 0.0 - 0.1 K/uL   Immature Granulocytes 0 %   Abs Immature Granulocytes 0.00 0.00 - 0.07 K/uL    Comment: Performed at [facility], [place] [address] [place],  [address]      Psychiatric Specialty Exam: Physical Exam  Review of Systems  Weight 170 lb (77.1 kg).There is no height or weight on file to calculate BMI.  General Appearance: Casual  Eye Contact:  Fair  Speech:  Pressured  Volume:  Decreased  Mood:  Anxious, Dysphoric, and labile  Affect:  Labile  Thought Process:  Descriptions of Associations: Circumstantial  Orientation:  Full (Time, Place, and Person)  Thought Content:  Paranoid Ideation and Rumination  Suicidal Thoughts:  No  Homicidal Thoughts:  No  Memory:  Immediate;   Fair Recent;   Fair Remote;   Fair  Judgement:  Fair  Insight:  Fair  Psychomotor Activity:  Increased  Concentration:  Concentration: Fair and Attention Span: Fair  Recall:  [employer] of Knowledge:  Fair  Language:  Fair  Akathisia:  No  Handed:  Right  AIMS (if indicated):     Assets:  Communication  Skills Desire for Improvement  ADL's:  Intact  Cognition:  WNL  Sleep:   5-6 hrs      Assessment and Plan: Schizoaffective disorder, bipolar type.  PTSD.  Cannabis use.  I reviewed records from recent emergency room which she described as a "total body lockup" and she could not move.  She wants Ativan I will explain that she is smoking marijuana and benzos cannot be given while she is smoking illegal drugs.  She is now taking  Seroquel 50 mg which is helping her sleep and Cymbalta prescribed by PCP for her fibromyalgia and chronic pain.  I recommend she can try BuSpar 5 mg twice a day to help her anxiety and nervousness.  After some discussion she agreed to give a try.  I also encouraged she should keep the appointment with [facility] for her coping skills.  I recommend to call us back if she has any question or any concern.  I also offer group therapy but patient refused.  Discussed safety concerns at any time having active suicidal thoughts or homicidal thought then she need to call 911 or go to local emergency room.  Follow-up in 2 months.  Follow Up Instructions:    I discussed the assessment and treatment plan with the patient. The patient was provided an opportunity to ask questions and all were answered. The patient agreed with the plan and demonstrated an understanding of the instructions.   The patient was advised to call back or seek an in-person evaluation if the symptoms worsen or if the condition fails to improve as anticipated.  Collaboration of Care: Primary Care Provider AEB notes are available in epic to review.  Patient/Guardian was advised Release of Information must be obtained prior to any record release in order to collaborate their care with an outside provider. Patient/Guardian was advised if they have not already done so to contact the registration department to sign all necessary forms in order for [place] to release information regarding their care.   Consent:  Patient/Guardian gives verbal consent for treatment and assignment of benefits for services provided during this visit. Patient/Guardian expressed understanding and agreed to proceed.    I provided 36 minutes of non-face-to-face time during this encounter.   Kathlee Nations, MD

## 2022-04-19 ENCOUNTER — Other Ambulatory Visit: Payer: Self-pay

## 2022-04-19 ENCOUNTER — Encounter (HOSPITAL_COMMUNITY): Payer: Self-pay

## 2022-04-19 ENCOUNTER — Emergency Department (HOSPITAL_COMMUNITY): Payer: Medicare HMO

## 2022-04-19 ENCOUNTER — Emergency Department (HOSPITAL_COMMUNITY)
Admission: EM | Admit: 2022-04-19 | Discharge: 2022-04-20 | Disposition: A | Discharge status: Home / Self Care | Payer: Medicare HMO | Attending: Emergency Medicine | Admitting: Emergency Medicine

## 2022-04-19 DIAGNOSIS — M5010 Cervical disc disorder with radiculopathy, unspecified cervical region: Secondary | ICD-10-CM | POA: Insufficient documentation

## 2022-04-19 DIAGNOSIS — R202 Paresthesia of skin: Secondary | ICD-10-CM

## 2022-04-19 DIAGNOSIS — R079 Chest pain, unspecified: Secondary | ICD-10-CM | POA: Insufficient documentation

## 2022-04-19 DIAGNOSIS — Z79899 Other long term (current) drug therapy: Secondary | ICD-10-CM | POA: Diagnosis not present

## 2022-04-19 DIAGNOSIS — E119 Type 2 diabetes mellitus without complications: Secondary | ICD-10-CM | POA: Insufficient documentation

## 2022-04-19 DIAGNOSIS — M5412 Radiculopathy, cervical region: Secondary | ICD-10-CM | POA: Diagnosis not present

## 2022-04-19 DIAGNOSIS — R0789 Other chest pain: Secondary | ICD-10-CM | POA: Diagnosis not present

## 2022-04-19 LAB — TROPONIN I (HIGH SENSITIVITY)
Troponin I (High Sensitivity): <2 ng/L (ref <18)
Troponin I (High Sensitivity): 2 ng/L (ref <18)

## 2022-04-19 LAB — CBC WITH DIFFERENTIAL/PLATELET
Abs Immature Granulocytes: 0.02 10*3/uL (ref 0.00–0.07)
Basophils Absolute: 0 10*3/uL (ref 0.0–0.1)
Basophils Relative: 0 %
Eosinophils Absolute: 0.1 10*3/uL (ref 0.0–0.5)
Eosinophils Relative: 2 %
HCT: 41 % (ref 36.0–46.0)
Hemoglobin: 13.2 g/dL (ref 12.0–15.0)
Immature Granulocytes: 0 %
Lymphocytes Relative: 32 %
Lymphs Abs: 1.9 10*3/uL (ref 0.7–4.0)
MCH: 26.8 pg (ref 26.0–34.0)
MCHC: 32.2 g/dL (ref 30.0–36.0)
MCV: 83.2 fL (ref 80.0–100.0)
Monocytes Absolute: 0.4 10*3/uL (ref 0.1–1.0)
Monocytes Relative: 7 %
Neutro Abs: 3.5 10*3/uL (ref 1.7–7.7)
Neutrophils Relative %: 59 %
Platelets: 231 10*3/uL (ref 150–400)
RBC: 4.93 MIL/uL (ref 3.87–5.11)
RDW: 15.1 % (ref 11.5–15.5)
WBC: 6 10*3/uL (ref 4.0–10.5)
nRBC: 0 % (ref 0.0–0.2)

## 2022-04-19 LAB — URINALYSIS, ROUTINE W REFLEX MICROSCOPIC
Bilirubin Urine: NEGATIVE
Glucose, UA: NEGATIVE mg/dL
Hgb urine dipstick: NEGATIVE
Ketones, ur: NEGATIVE mg/dL
Leukocytes,Ua: NEGATIVE
Nitrite: NEGATIVE
Protein, ur: NEGATIVE mg/dL
Specific Gravity, Urine: 1.002 — ABNORMAL LOW (ref 1.005–1.030)
pH: 8 (ref 5.0–8.0)

## 2022-04-19 LAB — MAGNESIUM: Magnesium: 2.2 mg/dL (ref 1.7–2.4)

## 2022-04-19 LAB — COMPREHENSIVE METABOLIC PANEL
ALT: 31 U/L (ref 0–44)
AST: 22 U/L (ref 15–41)
Albumin: 4.2 g/dL (ref 3.5–5.0)
Alkaline Phosphatase: 65 U/L (ref 38–126)
Anion gap: 9 (ref 5–15)
BUN: <5 mg/dL — ABNORMAL LOW (ref 6–20)
CO2: 26 mmol/L (ref 22–32)
Calcium: 9.9 mg/dL (ref 8.9–10.3)
Chloride: 110 mmol/L (ref 98–111)
Creatinine, Ser: 0.74 mg/dL (ref 0.44–1.00)
GFR, Estimated: >60 mL/min (ref >60)
Glucose, Bld: 111 mg/dL — ABNORMAL HIGH (ref 70–99)
Potassium: 4.4 mmol/L (ref 3.5–5.1)
Sodium: 145 mmol/L (ref 135–145)
Total Bilirubin: 0.5 mg/dL (ref 0.3–1.2)
Total Protein: 7.9 g/dL (ref 6.5–8.1)

## 2022-04-19 MED ORDER — PROPRANOLOL HCL 10 MG PO TABS
20.0000 mg | ORAL_TABLET | Freq: Once | ORAL | Status: AC
  Administered 2022-04-19: 20 mg via ORAL
  Filled 2022-04-19: qty 2

## 2022-04-19 NOTE — Telephone Encounter (Signed)
Call placed to patient and she states that she is currently in the ED, I informed her to contact office once discharged to schedule HFU.

## 2022-04-19 NOTE — ED Triage Notes (Signed)
Reports tingling all over and crampnig  Reports she typicaly takes ozempic and metformin but hasnt been able to due to her sugar being too low. Reports mild chest pain.

## 2022-04-19 NOTE — ED Notes (Signed)
Pt expressed concerns to this RN about increased leg discomfort and blood pressure. Pt is AOX4, respirations even and unlabored and no acute distress noted upon assessment by this RN. Pt states she has not had evening antihypertensive medications. This RN notified triage provider and evening dose of medication administered and tolerated well by pt. This RN will continue to monitor pt for any new or acute changes.

## 2022-04-19 NOTE — ED Provider Triage Note (Signed)
Emergency Medicine Provider Triage Evaluation Note  Ailen Strauch , a 41 y.o. female  was evaluated in triage.  Pt complains of chest tightness/tingling with generalized cramping in bilateral upper and lower extremities.  Denies emesis.  Endorses increase in number of stools.  Denies shortness of breath.  Review of Systems  Positive: As above Negative: As above  Physical Exam  BP 131/84 (BP Location: Right Arm)   Pulse 77   Temp 98.8 F (37.1 C) (Oral)   Resp 18   Ht '5\' 2"'$  (1.575 m)   Wt 77.1 kg   SpO2 100%   BMI 31.09 kg/m  Gen:   Awake, no distress   Resp:  Normal effort  MSK:   Moves extremities without difficulty  Other:  Good strength bilateral upper and lower extremities.  Abdomen nontender and nondistended.  Medical Decision Making  Medically screening exam initiated at 11:56 AM.  Appropriate orders placed.  Emelin Dascenzo was informed that the remainder of the evaluation will be completed by another provider, this initial triage assessment does not replace that evaluation, and the importance of remaining in the ED until their evaluation is complete.     Evlyn Courier, PA-C 04/19/22 1157

## 2022-04-20 DIAGNOSIS — R202 Paresthesia of skin: Secondary | ICD-10-CM | POA: Diagnosis not present

## 2022-04-20 MED ORDER — LIDOCAINE 5 % EX PTCH: 1.0000 | MEDICATED_PATCH | CUTANEOUS | 0 refills | Status: DC

## 2022-04-20 MED ORDER — AMLODIPINE BESYLATE 5 MG PO TABS
5.0000 mg | ORAL_TABLET | Freq: Once | ORAL | Status: AC
  Administered 2022-04-20: 5 mg via ORAL
  Filled 2022-04-20: qty 1

## 2022-04-20 MED ORDER — BUSPIRONE HCL 10 MG PO TABS
5.0000 mg | ORAL_TABLET | Freq: Once | ORAL | Status: AC
  Administered 2022-04-20: 5 mg via ORAL
  Filled 2022-04-20: qty 1

## 2022-04-20 MED ORDER — HYDROXYCHLOROQUINE SULFATE 200 MG PO TABS
200.0000 mg | ORAL_TABLET | Freq: Once | ORAL | Status: AC
  Administered 2022-04-20: 200 mg via ORAL
  Filled 2022-04-20: qty 1

## 2022-04-20 MED ORDER — DULOXETINE HCL 30 MG PO CPEP
60.0000 mg | ORAL_CAPSULE | Freq: Once | ORAL | Status: AC
  Administered 2022-04-20: 60 mg via ORAL
  Filled 2022-04-20: qty 2

## 2022-04-20 MED ORDER — METFORMIN HCL 500 MG PO TABS
500.0000 mg | ORAL_TABLET | Freq: Once | ORAL | Status: AC
  Administered 2022-04-20: 500 mg via ORAL
  Filled 2022-04-20: qty 1

## 2022-04-20 MED ORDER — PREGABALIN 100 MG PO CAPS
100.0000 mg | ORAL_CAPSULE | Freq: Once | ORAL | Status: AC
  Administered 2022-04-20: 100 mg via ORAL
  Filled 2022-04-20: qty 1

## 2022-04-20 NOTE — Discharge Instructions (Addendum)
Please follow-up with your primary care doctor.  Have your B12, folate acid tested.  Use lidocaine for chest pain.  If symptoms worsen please return to the ER.

## 2022-04-20 NOTE — ED Provider Notes (Signed)
[facility] EMERGENCY DEPARTMENT Provider Note   CSN: [id number] Arrival date & time: 04/19/22  1031     History  Chief Complaint  Patient presents with   Tingling    Victoria Holmes is a 41 y.o. female, hx of DM II, lupus, who presents to the ED secondary to multiple complaints, she states that she is here because she has had been having intermittent chest pain for the last 2 weeks.  Chest pain is in the substernal chest, stabbing in nature, intermittent, comes and goes.  Pain radiates to the left arm, and has associated sharp stabbing pains.  Denies alleviating or aggravating factors.  No shortness of breath, nausea, vomiting associated with it. Denies any recent illnesses. Non-smoker.   Also reports allover tingling of the body for the last 2 weeks.  She has taken Ativan with some relief, but still had some issues with this.  Also reports that the tingling is worse when her glucose is low. Denies any weakness.  Of note she has not been taking her Ozempic and Metformin for the last couple weeks secondary to having low sugars.    Home Medications Prior to Admission medications   Medication Sig Start Date End Date Taking? Authorizing Provider  lidocaine (LIDODERM) 5 % Place 1 patch onto the skin daily. Remove & Discard patch within 12 hours or as directed by MD 04/20/22  Yes Rebie Peale L, PA  Accu-Chek Softclix Lancets lancets Use to check blood sugar three times daily. E11.65 03/23/21   Charlott Rakes, MD  amLODipine (NORVASC) 5 MG tablet Take 1 tablet (5 mg total) by mouth daily. 01/16/22   Charlott Rakes, MD  atorvastatin (LIPITOR) 20 MG tablet Take 1 tablet (20 mg total) by mouth daily. 01/16/22   Charlott Rakes, MD  bismuth subsalicylate (PEPTO-BISMOL) 262 MG/15ML suspension 30 mls PO Q 6hrs x 10 days Patient not taking: Reported on 11/07/2021 10/30/21   Ladell Pier, MD  bismuth-metronidazole-tetracycline [facility]) [phone] MG capsule Take 3 capsules by  mouth 4 (four) times daily -  before meals and at bedtime. 10/28/21   Charlott Rakes, MD  Blood Glucose Monitoring Suppl (ACCU-CHEK GUIDE ME) w/Device KIT Use to check blood sugar three times daily. E11.65 03/23/21   Charlott Rakes, MD  busPIRone (BUSPAR) 5 MG tablet Take 1 tablet (5 mg total) by mouth 2 (two) times daily. 04/18/22 04/18/23  Arfeen, Arlyce Harman, MD  butalbital-acetaminophen-caffeine (FIORICET) [phone] MG tablet Take 1-2 tablets by mouth every 6 (six) hours as needed for headache. 08/16/21 08/16/22  Charlott Rakes, MD  DULoxetine (CYMBALTA) 60 MG capsule Take 1 capsule (60 mg total) by mouth daily. 01/16/22   Charlott Rakes, MD  EPINEPHrine (EPIPEN JR) 0.15 MG/0.3ML injection Inject 0.15 mg into the muscle as needed.    [provider]  glucose blood (ACCU-CHEK GUIDE) test strip Use to check blood sugar three times daily. E11.65 03/23/21   Charlott Rakes, MD  hydroxychloroquine (PLAQUENIL) 200 MG tablet Take 200 mg by mouth 2 (two) times daily.    [provider]  ibuprofen (ADVIL,MOTRIN) 200 MG tablet Take 200 mg by mouth every 6 (six) hours as needed for fever, headache, mild pain, moderate pain or cramping.    [provider]  metFORMIN (GLUCOPHAGE) 500 MG tablet Take 1 tablet (500 mg total) by mouth daily with breakfast. 01/16/22   Charlott Rakes, MD  metroNIDAZOLE (FLAGYL) 250 MG tablet Take 1 tablet (250 mg total) by mouth 4 (four) times daily. Patient not taking:  Reported on 11/07/2021 10/30/21   Ladell Pier, MD  omeprazole (PRILOSEC) 20 MG capsule Take 1 capsule (20 mg total) by mouth 2 (two) times daily before a meal. 10/28/21   Charlott Rakes, MD  pregabalin (LYRICA) 100 MG capsule Take 100 mg by mouth 2 (two) times daily.    [provider]  propranolol (INDERAL) 20 MG tablet TAKE TWO TABLETS BY MOUTH TWICE DAILY 03/05/22   Charlott Rakes, MD  QUEtiapine (SEROQUEL) 50 MG tablet Take 1 tablet (50 mg total) by mouth at bedtime. 04/18/22 04/18/23   Arfeen, Arlyce Harman, MD  Semaglutide,0.25 or 0.5MG/DOS, (OZEMPIC, 0.25 OR 0.5 MG/DOSE,) 2 MG/1.5ML SOPN Inject 0.5 mg into the skin once a week. 01/16/22   Charlott Rakes, MD  tetracycline (SUMYCIN) 500 MG capsule Take 1 capsule (500 mg total) by mouth 4 (four) times daily. 10/30/21   Ladell Pier, MD  tiZANidine (ZANAFLEX) 4 MG tablet Take 1 tablet (4 mg total) by mouth every 8 (eight) hours as needed for muscle spasms. 04/18/22   Charlott Rakes, MD  valACYclovir (VALTREX) 1000 MG tablet Take 1,000 mg by mouth daily. 05/03/20   [provider]      Allergies    Amoxicillin, Azithromycin, Darvocet [propoxyphene n-acetaminophen], Grapeseed extract [nutritional supplements], Kiwi extract, Latex, Macrolides and ketolides, Morphine and related, Sulfa antibiotics, Klonopin [clonazepam], Penicillins, Zoloft [sertraline hcl], Contrast media [iodinated contrast media], Flagyl [metronidazole], Adhesive [tape], Concerta [methylphenidate], Lexapro [escitalopram oxalate], and Lortab [hydrocodone-acetaminophen]    Review of Systems   Review of Systems  Respiratory:  Negative for shortness of breath.   Cardiovascular:  Positive for chest pain.  Gastrointestinal:  Negative for nausea.  Neurological:  Positive for numbness.    Physical Exam Updated Vital Signs BP 114/89 (BP Location: Right Arm)   Pulse 60   Temp 97.9 F (36.6 C) (Oral)   Resp 16   Ht 5' 2"  (1.575 m)   Wt 77.1 kg   SpO2 100%   BMI 31.09 kg/m  Physical Exam Vitals and nursing note reviewed.  Constitutional:      General: She is not in acute distress.    Appearance: She is well-developed.  HENT:     Head: Normocephalic and atraumatic.  Eyes:     Conjunctiva/sclera: Conjunctivae normal.  Cardiovascular:     Rate and Rhythm: Normal rate and regular rhythm.     Heart sounds: No murmur heard. Pulmonary:     Effort: Pulmonary effort is normal. No respiratory distress.     Breath sounds: Normal breath sounds.  Chest:      Chest wall: Tenderness present.  Abdominal:     Palpations: Abdomen is soft.     Tenderness: There is no abdominal tenderness.  Musculoskeletal:        General: No swelling.     Cervical back: Neck supple.     Comments: +spurling, +Phalens of L wrist  Skin:    General: Skin is warm and dry.     Capillary Refill: Capillary refill takes less than 2 seconds.  Neurological:     General: No focal deficit present.     Mental Status: She is alert and oriented to person, place, and time.     Cranial Nerves: Cranial nerves 2-12 are intact. No cranial nerve deficit.     Sensory: Sensation is intact.     Motor: Motor function is intact.     Coordination: Coordination is intact.  Psychiatric:        Mood and Affect: Mood  normal.     ED Results / Procedures / Treatments   Labs (all labs ordered are listed, but only abnormal results are displayed) Labs Reviewed  COMPREHENSIVE METABOLIC PANEL - Abnormal; Notable for the following components:      Result Value   Glucose, Bld 111 (*)    BUN <5 (*)    All other components within normal limits  URINALYSIS, ROUTINE W REFLEX MICROSCOPIC - Abnormal; Notable for the following components:   Color, Urine STRAW (*)    Specific Gravity, Urine 1.002 (*)    All other components within normal limits  CBC WITH DIFFERENTIAL/PLATELET  MAGNESIUM  CBG MONITORING, ED  TROPONIN I (HIGH SENSITIVITY)  TROPONIN I (HIGH SENSITIVITY)    EKG EKG Interpretation  Date/Time:  Thursday April 19 2022 11:56:03 EDT Ventricular Rate:  65 PR Interval:  142 QRS Duration: 74 QT Interval:  380 QTC Calculation: 395 R Axis:   56 Text Interpretation: Normal sinus rhythm Normal ECG When compared with ECG of 02-Apr-2022 21:07, T wave abnormality is no longer present Confirmed by Delora Fuel ([id number]) on 04/20/2022 3:37:42 AM  Radiology DG Chest 2 View  Result Date: 04/19/2022 CLINICAL DATA:  Chest pain, tingling/numbness in lower extremities x2 weeks. EXAM: CHEST -  2 VIEW COMPARISON:  April 02, 2022. FINDINGS: The heart size and mediastinal contours are within normal limits. Both lungs are clear. The visualized skeletal structures are unremarkable. Gallbladder surgically absent. IMPRESSION: No acute cardiopulmonary disease. Electronically Signed   By: Dahlia Bailiff M.D.   On: 04/19/2022 12:15    Procedures Procedures  Medications Ordered in ED Medications  propranolol (INDERAL) tablet 20 mg (20 mg Oral Given 04/19/22 2045)  busPIRone (BUSPAR) tablet 5 mg (5 mg Oral Given 04/20/22 1107)  pregabalin (LYRICA) capsule 100 mg (100 mg Oral Given 04/20/22 1107)  DULoxetine (CYMBALTA) DR capsule 60 mg (60 mg Oral Given 04/20/22 1107)  metFORMIN (GLUCOPHAGE) tablet 500 mg (500 mg Oral Given 04/20/22 1107)  hydroxychloroquine (PLAQUENIL) tablet 200 mg (200 mg Oral Given 04/20/22 1107)  amLODipine (NORVASC) tablet 5 mg (5 mg Oral Given 04/20/22 1107)    ED Course/ Medical Decision Making/ A&P                           Medical Decision Making Amount and/or Complexity of Data Reviewed Labs: ordered.  Risk Prescription drug management.  This patient presents to the ED for concern of chest pain, paresthesias, left arm pain  Co morbidities that complicate the patient evaluation  Diabetes Mellitus Type II, RA   Lab Tests:  I Ordered, and personally interpreted labs.  The pertinent results include: electrolytes within normal limits, no evidence of anemia, troponins within normal limits.   Imaging Studies ordered:  I ordered imaging studies including chest x-ray is unremarkable.  Cardiac Monitoring: / EKG:  The patient was maintained on a cardiac monitor.  I personally viewed and interpreted the cardiac monitored which showed an underlying rhythm of: normal sinus rhythm   Test / Admission - Considered:  Patient is a 41 year old female here for a variety of issues.  Chest pain, central, stabbing, persistent for last 2 weeks.  No neurodeficits on exam  except for perceived paresthesias.  She has good pulses that are even.  She is not in distress.  Chest x-ray unremarkable w/no widened mediastinum.  Troponins within normal limits.  EKG shows normal sinus rhythm.  Pain improved with Ativan per patient, possibly secondary to anxiety.  PERC negative, HEART 2.  Provided with lidocaine patch for home secondary to tenderness to palpation of chest wall.  She denies any trauma, recent cough.  Etiology unknown. Additionally complained of paresthesias, no evidence of neurodeficits.  Possibly secondary to J50, folic acid deficiency?  No signs of anemia, mag within normal limits, K within limit normal limits.  No confusion.  Advised on follow-up with PCP for further testing. Left arm pain, stabbing, has known C5-C7 issues per patient, has positive Spurling's, positive Tinel's.  Possible compression of nerves secondary to radiculopathy.  Discussed with patient, treatment.  Advised on follow-up with primary care provider. Return precautions emphasized. Has appt scheduled with PCP.  Final Clinical Impression(s) / ED Diagnoses Final diagnoses:  Paresthesia  Cervical disc disorder with radiculopathy of cervical region  Chest pain, unspecified type    Rx / DC Orders ED Discharge Orders          Ordered    lidocaine (LIDODERM) 5 %  Every 24 hours        04/20/22 1126              Besse Miron, [place], [place] 04/20/22 1142    Davonna Belling, MD 04/20/22 1533

## 2022-04-23 ENCOUNTER — Ambulatory Visit: Payer: Self-pay | Admitting: *Deleted

## 2022-04-23 NOTE — Telephone Encounter (Signed)
No appointments prior to 05/08/2022.  Advised to keep apt. Scheduled already.  Informed of mobile unit options but still would like to keep apt.

## 2022-04-23 NOTE — Telephone Encounter (Signed)
  Chief Complaint: Needs appt for ED visit follow up within 3 days.  Seen 04/19/2022 for chest pain and several other issues.   (See visit notes) Symptoms: intermittent chest pain, tingling in legs and fingers, weakness and bruising inside both legs Frequency: Intermittent Chest Pain Pertinent Negatives: Patient denies passing out. Disposition: '[]'$ ED /'[]'$ Urgent Care (no appt availability in office) / '[]'$ Appointment(In office/virtual)/ '[]'$  [facility]/ '[]'$ Home Care/ '[]'$ Refused Recommended Disposition /'[]'$ [facility]/ '[x]'$  Follow-up with PCP Additional Notes: High priority message sent to [place] that she needs a follow up appt. From ED visit.  Pt agreeable to being called back.

## 2022-04-23 NOTE — Telephone Encounter (Signed)
Reason for Disposition  [1] Chest pain lasts > 5 minutes AND [2] occurred > 3 days ago (72 hours) AND [3] NO chest pain or cardiac symptoms now    Told to see PCP within 3 days at the ED on 04/19/2022  Answer Assessment - Initial Assessment Questions 1. LOCATION: "Where does it hurt?"       I'm having chest pain that is intermittent.  I've been seen in the ED for it and my heart is fine.   I'm having itching in my bloodstream.   My body has not been the same since.   I'm itching inside my whole body constantly.   I was seen in the ED for it.   I put in a call to Dr. Juel Burrow office too.    Last time this happened I had a Lupus flare.   Pt with multiple issues.  My legs are so weak.   I don't feel like myself.   They told me to follow up with Dr. Margarita Rana in 3 days in the ED.   That's why I'm calling today.   I need to be seen.    My heart looked good.    I went to ED on 04/02/2022 for chest pain.   I'm having tingling in my legs and fingers.   I have bruises inside my legs.   On 04/19/2022 I went back to ED due to same issues above.    My folic acid and G31 needs to be checked.     I have Lupus and fibromyalgia.    They want me to follow up with Dr. Margarita Rana.    2. RADIATION: "Does the pain go anywhere else?" (e.g., into neck, jaw, arms, back)     Multiple issues. 3. ONSET: "When did the chest pain begin?" (Minutes, hours or days)      Intermittently 4. PATTERN: "Does the pain come and go, or has it been constant since it started?"  "Does it get worse with exertion?"      Yes 5. DURATION: "How long does it last" (e.g., seconds, minutes, hours)     Not asked since been evaluated in ED 6. SEVERITY: "How bad is the pain?"  (e.g., Scale 1-10; mild, moderate, or severe)    - MILD (1-3): doesn't interfere with normal activities     - MODERATE (4-7): interferes with normal activities or awakens from sleep    - SEVERE (8-10): excruciating pain, unable to do any normal activities       Not asked 7. CARDIAC RISK  FACTORS: "Do you have any history of heart problems or risk factors for heart disease?" (e.g., angina, prior heart attack; diabetes, high blood pressure, high cholesterol, smoker, or strong family history of heart disease)     Has Lupus and Fibromyalgia.  8. PULMONARY RISK FACTORS: "Do you have any history of lung disease?"  (e.g., blood clots in lung, asthma, emphysema, birth control pills)     Not asked 9. CAUSE: "What do you think is causing the chest pain?"     I have Lupus.   All my heart tests in ED were fine. 10. OTHER SYMPTOMS: "Do you have any other symptoms?" (e.g., dizziness, nausea, vomiting, sweating, fever, difficulty breathing, cough)       Legs tingling, Fingers tingling both hands, weakness, bruising inside both legs. 11. PREGNANCY: "Is there any chance you are pregnant?" "When was your last menstrual period?"       Not asked  Protocols used: Chest  Pain-A-AH

## 2022-04-24 ENCOUNTER — Telehealth: Payer: Self-pay

## 2022-04-24 NOTE — Telephone Encounter (Signed)
     Patient  visit on 9/29  at [facility]  Have you been able to follow up with your primary care physician? YES  The patient was or was not able to obtain any needed medicine or equipment. YES  Are there diet recommendations that you are having difficulty following?  NA Patient expresses understanding of discharge instructions and education provided has no other needs at this time. [place], [facility], Care Management  [phone] [address], [place], [place] [address] Phone: [phone] Email: [email or web address]

## 2022-05-08 ENCOUNTER — Encounter: Payer: Self-pay | Admitting: Family Medicine

## 2022-05-08 ENCOUNTER — Ambulatory Visit: Payer: Medicare HMO | Attending: Family Medicine | Admitting: Family Medicine

## 2022-05-08 ENCOUNTER — Telehealth: Payer: Self-pay

## 2022-05-08 VITALS — BP 141/88 | HR 65 | Ht 62.5 in | Wt 167.0 lb

## 2022-05-08 DIAGNOSIS — E1169 Type 2 diabetes mellitus with other specified complication: Secondary | ICD-10-CM

## 2022-05-08 DIAGNOSIS — R202 Paresthesia of skin: Secondary | ICD-10-CM | POA: Diagnosis not present

## 2022-05-08 DIAGNOSIS — M069 Rheumatoid arthritis, unspecified: Secondary | ICD-10-CM | POA: Diagnosis not present

## 2022-05-08 DIAGNOSIS — R5383 Other fatigue: Secondary | ICD-10-CM

## 2022-05-08 DIAGNOSIS — R69 Illness, unspecified: Secondary | ICD-10-CM | POA: Diagnosis not present

## 2022-05-08 DIAGNOSIS — F259 Schizoaffective disorder, unspecified: Secondary | ICD-10-CM | POA: Insufficient documentation

## 2022-05-08 DIAGNOSIS — F250 Schizoaffective disorder, bipolar type: Secondary | ICD-10-CM

## 2022-05-08 LAB — GLUCOSE, POCT (MANUAL RESULT ENTRY): POC Glucose: 129 mg/dl — AB (ref 70–99)

## 2022-05-08 MED ORDER — OZEMPIC (0.25 OR 0.5 MG/DOSE) 2 MG/1.5ML ~~LOC~~ SOPN: 0.2500 mg | PEN_INJECTOR | SUBCUTANEOUS | 6 refills | Status: DC

## 2022-05-08 MED ORDER — PREGABALIN 100 MG PO CAPS: 100.0000 mg | ORAL_CAPSULE | Freq: Two times a day (BID) | ORAL | 3 refills | Status: DC

## 2022-05-08 NOTE — Patient Instructions (Signed)
Paresthesia Paresthesia is a burning or prickling feeling. This feeling can happen in any part of the body. It often happens in the hands, arms, legs, or feet. Usually, it is not painful. In most cases, the feeling goes away in a short time and is not a sign of a serious problem. If you have paresthesia that lasts a long time, you need to see your doctor. Follow these instructions at home: Nutrition Eat a healthy diet. This includes: Eating foods that are high in fiber. These include beans, whole grains, and fresh fruits and vegetables. Limiting foods that are high in fat and sugar. These include fried or sweet foods.  Alcohol use  Do not drink alcohol if: Your doctor tells you not to drink. You are pregnant, may be pregnant, or are planning to become pregnant. If you drink alcohol: Limit how much you have to: 0-1 drink a day for women. 0-2 drinks a day for men. Know how much alcohol is in your drink. In the [place] one drink equals one 12 oz bottle of beer (355 mL), one 5 oz glass of wine (148 mL), or one 1 oz glass of hard liquor (44 mL). General instructions Take over-the-counter and prescription medicines only as told by your doctor. Do not smoke or use any products that contain nicotine or tobacco. If you need help quitting, ask your doctor. If you have diabetes, work with your doctor to make sure your blood sugar stays in a healthy range. If your feet feel numb: Check for redness, warmth, and swelling every day. Wear padded socks and comfortable shoes. These help protect your feet. Keep all follow-up visits. Contact a doctor if: You have paresthesia that gets worse or does not go away. You lose feeling (have numbness) after an injury. Your burning or prickling feeling gets worse when you walk. You have pain or cramps. You feel dizzy or you faint. You have a rash. Get help right away if: You feel weak or have new weakness in an arm or leg. You have trouble walking or  moving. You have problems speaking, understanding, or seeing. You feel confused. You cannot control when you pee (urinate) or poop (have a bowel movement). These symptoms may be an emergency. Get help right away. Call 911. Do not wait to see if the symptoms will go away. Do not drive yourself to the hospital. Summary Paresthesia is a burning or prickling feeling. It often happens in the hands, arms, legs, or feet. In most cases, the feeling goes away in a short time and is not a sign of a serious problem. If you have paresthesia that lasts a long time, you need to be seen by your doctor. This information is not intended to replace advice given to you by your health care provider. Make sure you discuss any questions you have with your health care provider. Document Revised: 03/20/2021 Document Reviewed: 03/20/2021 Elsevier Patient Education  2023 Elsevier Inc.  

## 2022-05-08 NOTE — Progress Notes (Signed)
Subjective:  Patient ID: Victoria Holmes, female    DOB: [birth year]  Age: 41 y.o. MRN: [id number]  CC: Anorexia, Fatigue, and Medication Refill   HPI Victoria Holmes is a 41 y.o. year old female with a history of type 2 diabetes mellitus (A1c 5.7) rheumatoid arthritis, lupus, Raynaud's syndrome, Schizoaffective disorder, PTSD, on Valtrex for Herpes Simplex prophylaxis, pseudotumor cerebri, H. pylori gastritis, chronic nausea and vomiting.  Interval History: She informs the nurse today that her major concerns are loss of appetite, fatigue. Seen by GI where she had also complained of poor appetite, dysphagia, nausea and vomiting in 10/2021 and advised to use Zofran as needed, avoid THC use, repeat H. pylori to check for eradication, consider EGD in the future and she was to return in 2 months. Of note she is on Ozempic for her diabetes and she has lost 9 pounds since initiation.  She states she had a 'full body lock up' and had presented to the ED on 04/02/22 but left after waiting for 1 hour. She presented again on 04/19/22 with similar symptoms and chest pain.  She is not satisfied about the care she received. She was seen by Rheumatology once and would like a referral to another rheumatologist and has the name which was provided by her insurance company. During her history she is tangential.  She has numbness and fatigue, weakness and states it has been present since 04/02/22.  States she was diagnosed with fibromyalgia by previous PCP.  Numbness occurs in her entire body.  She currently takes Cymbalta and Lyrica. I had referred her for PT and aquatic therapy which she had to discontinue. She does not have transportation for Physical Therapy  Requests completion of form for handicap placard Past Medical History:  Diagnosis Date   Anemia    no current med.   Anxiety    Chronic tonsillitis 03/2012   Depression    Diabetes mellitus    diet-controlled   Fibrocystic breast disease     Headache(784.0)    migraines   History of endometriosis    Hyperlipidemia    Hypertension    under control, has been on med. x 1 yr.   Lupus (HCC)    Lupus (HCC)    Neuritis    Panic attacks    Raynauds disease    Rheumatoid arthritis(714.0)    Schizoaffective disorder (HCC)    Seizures (HCC)    last seizure > 1 yr. ago   Sinusitis    Sleep disturbance    Thyroid nodule     Past Surgical History:  Procedure Laterality Date   ABDOMINAL HYSTERECTOMY  6 yrs. ago   complete   CHOLECYSTECTOMY  6-8 yrs. ago   FOOT SURGERY  05/2011   right great toe   TONSILLECTOMY  04/22/2012   Procedure: TONSILLECTOMY;  Surgeon: Rozetta Nunnery, MD;  Location: [facility];  Service: ENT;  Laterality: N/A;    Family History  Problem Relation Age of Onset   Colon cancer Father    Heart attack Maternal Grandmother        late 40's   Arthritis Maternal Grandfather    Cancer Maternal Grandfather        lung   Diabetes Paternal Grandmother    Hypertension Paternal Grandmother    Kidney disease Paternal Grandmother    Lupus Other    Pancreatic cancer Neg Hx    Stomach cancer Neg Hx    Liver cancer Neg Hx    Esophageal  cancer Neg Hx     Social History   Socioeconomic History   Marital status: Single    Spouse name: Not on file   Number of children: 1   Years of education: Not on file   Highest education level: Not on file  Occupational History   Occupation: [employer]  Tobacco Use   Smoking status: Former    Packs/day: 0.25    Years: 3.00    Total pack years: 0.75    Types: Cigarettes    Quit date: 01/2019    Years since quitting: 3.2   Smokeless tobacco: Never   Tobacco comments:    3 cig./day  Vaping Use   Vaping Use: Never used  Substance and Sexual Activity   Alcohol use: Yes    Alcohol/week: 0.0 standard drinks of alcohol    Comment: occasionally   Drug use: Yes    Types: Marijuana   Sexual activity: Not on file  Other Topics Concern   Not on  file  Social History Narrative   Not on file   Social Determinants of Health   Financial Resource Strain: Not on file  Food Insecurity: Not on file  Transportation Needs: Not on file  Physical Activity: Not on file  Stress: Not on file  Social Connections: Not on file    Allergies  Allergen Reactions   Amoxicillin Hives and Rash    Has patient had a PCN reaction causing immediate rash, facial/tongue/throat swelling, SOB or lightheadedness with hypotension: Yes Has patient had a PCN reaction causing severe rash involving mucus membranes or skin necrosis: Yes Has patient had a PCN reaction that required hospitalization: No Has patient had a PCN reaction occurring within the last 10 years: No If all of the above answers are "NO", then may proceed with Cephalosporin use.    Azithromycin Shortness Of Breath and Rash   Darvocet [Propoxyphene N-Acetaminophen] Shortness Of Breath and Rash   Grapeseed Extract [Nutritional Supplements] Shortness Of Breath, Swelling and Rash    GRAPES   Kiwi Extract Shortness Of Breath, Swelling and Rash   Latex Shortness Of Breath   Macrolides And Ketolides Shortness Of Breath and Rash   Morphine And Related Shortness Of Breath, Rash and Other (See Comments)    MUSCLE RIGIDITY   Sulfa Antibiotics Shortness Of Breath    MUSCLE RIGIDITY   Klonopin [Clonazepam] Rash and Other (See Comments)    HOMICIDAL THOUGHTS   Penicillins Itching and Swelling    Swelling of mouth Has patient had a PCN reaction causing immediate rash, facial/tongue/throat swelling, SOB or lightheadedness with hypotension: Yes Has patient had a PCN reaction causing severe rash involving mucus membranes or skin necrosis: No Has patient had a PCN reaction that required hospitalization: No Has patient had a PCN reaction occurring within the last 10 years: No If all of the above answers are "NO", then may proceed with Cephalosporin use.    Zoloft [Sertraline Hcl] Other (See Comments)     TARDIVE DYSKINESIA   Contrast Media [Iodinated Contrast Media] Swelling    Pt CAN NOT have drinking contrast; She CAN have IV contrast; when pt drinks water soluble contrast, she develops shallow breathing and hives along with Herpes Type 2 around her eyes   Flagyl [Metronidazole] Nausea And Vomiting   Adhesive [Tape] Rash   Concerta [Methylphenidate] Nausea Only   Lexapro [Escitalopram Oxalate] Diarrhea and Rash   Lortab [Hydrocodone-Acetaminophen] Rash    Outpatient Medications Prior to Visit  Medication Sig Dispense Refill  metFORMIN (GLUCOPHAGE) 500 MG tablet Take 1 tablet (500 mg total) by mouth daily with breakfast. 180 tablet 1   Semaglutide,0.25 or 0.5MG/DOS, (OZEMPIC, 0.25 OR 0.5 MG/DOSE,) 2 MG/1.5ML SOPN Inject 0.5 mg into the skin once a week. 2 mL 6   Accu-Chek Softclix Lancets lancets Use to check blood sugar three times daily. E11.65 100 each 3   amLODipine (NORVASC) 5 MG tablet Take 1 tablet (5 mg total) by mouth daily. 90 tablet 1   atorvastatin (LIPITOR) 20 MG tablet Take 1 tablet (20 mg total) by mouth daily. 90 tablet 1   bismuth subsalicylate (PEPTO-BISMOL) 262 MG/15ML suspension 30 mls PO Q 6hrs x 10 days (Patient not taking: Reported on 11/07/2021) 360 mL 0   bismuth-metronidazole-tetracycline (PYLERA) 140-125-125 MG capsule Take 3 capsules by mouth 4 (four) times daily -  before meals and at bedtime. 168 capsule 0   Blood Glucose Monitoring Suppl (ACCU-CHEK GUIDE ME) w/Device KIT Use to check blood sugar three times daily. E11.65 1 kit 0   busPIRone (BUSPAR) 5 MG tablet Take 1 tablet (5 mg total) by mouth 2 (two) times daily. 60 tablet 1   butalbital-acetaminophen-caffeine (FIORICET) 50-325-40 MG tablet Take 1-2 tablets by mouth every 6 (six) hours as needed for headache. 20 tablet 0   DULoxetine (CYMBALTA) 60 MG capsule Take 1 capsule (60 mg total) by mouth daily. 90 capsule 1   EPINEPHrine (EPIPEN JR) 0.15 MG/0.3ML injection Inject 0.15 mg into the muscle as needed.      glucose blood (ACCU-CHEK GUIDE) test strip Use to check blood sugar three times daily. E11.65 100 each 3   hydroxychloroquine (PLAQUENIL) 200 MG tablet Take 200 mg by mouth 2 (two) times daily.     ibuprofen (ADVIL,MOTRIN) 200 MG tablet Take 200 mg by mouth every 6 (six) hours as needed for fever, headache, mild pain, moderate pain or cramping.     lidocaine (LIDODERM) 5 % Place 1 patch onto the skin daily. Remove & Discard patch within 12 hours or as directed by MD 30 patch 0   metroNIDAZOLE (FLAGYL) 250 MG tablet Take 1 tablet (250 mg total) by mouth 4 (four) times daily. (Patient not taking: Reported on 11/07/2021) 40 tablet 0   omeprazole (PRILOSEC) 20 MG capsule Take 1 capsule (20 mg total) by mouth 2 (two) times daily before a meal. 28 capsule 0   propranolol (INDERAL) 20 MG tablet TAKE TWO TABLETS BY MOUTH TWICE DAILY 120 tablet 3   QUEtiapine (SEROQUEL) 50 MG tablet Take 1 tablet (50 mg total) by mouth at bedtime. 30 tablet 1   tetracycline (SUMYCIN) 500 MG capsule Take 1 capsule (500 mg total) by mouth 4 (four) times daily. 40 capsule 0   tiZANidine (ZANAFLEX) 4 MG tablet Take 1 tablet (4 mg total) by mouth every 8 (eight) hours as needed for muscle spasms. 270 tablet 1   valACYclovir (VALTREX) 1000 MG tablet Take 1,000 mg by mouth daily.     pregabalin (LYRICA) 100 MG capsule Take 100 mg by mouth 2 (two) times daily.     No facility-administered medications prior to visit.     ROS Review of Systems  Constitutional:  Positive for appetite change and fatigue. Negative for activity change.  HENT:  Negative for sinus pressure and sore throat.   Respiratory:  Negative for chest tightness, shortness of breath and wheezing.   Cardiovascular:  Negative for chest pain and palpitations.  Gastrointestinal:  Negative for abdominal distention, abdominal pain and constipation.  Genitourinary: Negative.  Musculoskeletal: Negative.   Neurological:  Positive for numbness.   Psychiatric/Behavioral:  Positive for dysphoric mood. Negative for behavioral problems.     Objective:  BP (!) 141/88   Pulse 65   Ht 5' 2.5" (1.588 m)   Wt 167 lb (75.8 kg)   SpO2 99%   BMI 30.06 kg/m      05/08/2022    9:33 AM 04/20/2022   11:30 AM 04/20/2022   11:00 AM  BP/Weight  Systolic BP 993 716 967  Diastolic BP 88 74 89  Wt. (Lbs) 167    BMI 30.06 kg/m2      Wt Readings from Last 3 Encounters:  05/08/22 167 lb (75.8 kg)  04/19/22 170 lb (77.1 kg)  01/16/22 176 lb (79.8 kg)     Physical Exam Constitutional:      Appearance: She is well-developed.  Cardiovascular:     Rate and Rhythm: Normal rate.     Heart sounds: Normal heart sounds. No murmur heard. Pulmonary:     Effort: Pulmonary effort is normal.     Breath sounds: Normal breath sounds. No wheezing or rales.  Chest:     Chest wall: No tenderness.  Abdominal:     General: Bowel sounds are normal. There is no distension.     Palpations: Abdomen is soft. There is no mass.     Tenderness: There is no abdominal tenderness.  Musculoskeletal:        General: Normal range of motion.     Right lower leg: No edema.     Left lower leg: No edema.  Neurological:     Mental Status: She is alert and oriented to person, place, and time.  Psychiatric:        Mood and Affect: Mood normal.        Latest Ref Rng & Units 04/19/2022   11:59 AM 04/02/2022    9:10 PM 10/25/2021    9:00 AM  CMP  Glucose 70 - 99 mg/dL 111  114  88   BUN 6 - 20 mg/dL <5  7  10    Creatinine 0.44 - 1.00 mg/dL 0.74  0.87  0.75   Sodium 135 - 145 mmol/L 145  140  144   Potassium 3.5 - 5.1 mmol/L 4.4  3.4  4.4   Chloride 98 - 111 mmol/L 110  107  106   CO2 22 - 32 mmol/L 26  21  24    Calcium 8.9 - 10.3 mg/dL 9.9  10.0  9.9   Total Protein 6.5 - 8.1 g/dL 7.9   7.9   Total Bilirubin 0.3 - 1.2 mg/dL 0.5   0.5   Alkaline Phos 38 - 126 U/L 65   96   AST 15 - 41 U/L 22   21   ALT 0 - 44 U/L 31   32     Lipid Panel     Component  Value Date/Time   CHOL 99 (L) 10/25/2021 0900   TRIG 65 10/25/2021 0900   HDL 40 10/25/2021 0900   CHOLHDL 4.3 11/10/2020 0928   LDLCALC 45 10/25/2021 0900    CBC    Component Value Date/Time   WBC 6.0 04/19/2022 1159   RBC 4.93 04/19/2022 1159   HGB 13.2 04/19/2022 1159   HCT 41.0 04/19/2022 1159   PLT 231 04/19/2022 1159   MCV 83.2 04/19/2022 1159   MCH 26.8 04/19/2022 1159   MCHC 32.2 04/19/2022 1159   RDW 15.1 04/19/2022 1159   LYMPHSABS  1.9 04/19/2022 1159   MONOABS 0.4 04/19/2022 1159   EOSABS 0.1 04/19/2022 1159   BASOSABS 0.0 04/19/2022 1159    Lab Results  Component Value Date   HGBA1C 5.7 01/16/2022    Lab Results  Component Value Date   TSH 1.07 11/07/2021    Assessment & Plan:  1. Type 2 diabetes mellitus with other specified complication, without long-term current use of insulin (HCC) Controlled with A1c of 5.7 Due to concern about decreased appetite she would like to try decreased dose of Ozempic and this has been sent to her pharmacy.  I have also explained to her that decreasing the dose will amount and some weight gain Counseled on Diabetic diet, my plate method, 829 minutes of moderate intensity exercise/week Blood sugar logs with fasting goals of 80-120 mg/dl, random of less than 180 and in the event of sugars less than 60 mg/dl or greater than 400 mg/dl encouraged to notify the clinic. Advised on the need for annual eye exams, annual foot exams, Pneumonia vaccine. - POCT glucose (manual entry) - Semaglutide,0.25 or 0.5MG/DOS, (OZEMPIC, 0.25 OR 0.5 MG/DOSE,) 2 MG/1.5ML SOPN; Inject 0.25 mg into the skin once a week.  Dispense: 2 mL; Refill: 6 - Hemoglobin A1c  2. Other fatigue She does have a history of fibromyalgia and chronic fatigue Advised she will benefit from aquatic therapy and PT Unfortunately lack of transportation precludes her involvement in PT - VITAMIN D 25 Hydroxy (Vit-D Deficiency, Fractures)  3. Paresthesia Unsure if this is due  to diabetic neuropathy versus somatic symptoms related to schizoaffective disorder Continue Cymbalta and Lyrica - Vitamin B12 - Folate - pregabalin (LYRICA) 100 MG capsule; Take 1 capsule (100 mg total) by mouth 2 (two) times daily.  Dispense: 60 capsule; Refill: 3  4. Rheumatoid arthritis involving multiple sites, unspecified whether rheumatoid factor present [facility]) So far she has been seen by 2 rheumatologist but has been gone for defied with her care I have placed referral again as requested Advise she does not meet criteria for handicap form.  She will need to discuss with her rheumatologist if she feels her rheumatoid arthritis qualifies her for handicap form. - Ambulatory referral to Rheumatology  5. Schizoaffective disorder, bipolar type ([place]) This may be playing a role in her current physical symptoms Continue with medications and follow-up with psychiatrist-Dr. Adele Schilder as well as counselor    Meds ordered this encounter  Medications   Semaglutide,0.25 or 0.5MG/DOS, (OZEMPIC, 0.25 OR 0.5 MG/DOSE,) 2 MG/1.5ML SOPN    Sig: Inject 0.25 mg into the skin once a week.    Dispense:  2 mL    Refill:  6    Dose decreased   pregabalin (LYRICA) 100 MG capsule    Sig: Take 1 capsule (100 mg total) by mouth 2 (two) times daily.    Dispense:  60 capsule    Refill:  3    Follow-up: Return in about 6 months (around 11/07/2022).       Charlott Rakes, MD, FAAFP. [facility] [place], [place]   05/08/2022, 10:06 AM

## 2022-05-09 ENCOUNTER — Other Ambulatory Visit: Payer: Self-pay | Admitting: Family Medicine

## 2022-05-09 LAB — FOLATE: Folate: 7.5 ng/mL (ref >3.0)

## 2022-05-09 LAB — HEMOGLOBIN A1C
Est. average glucose Bld gHb Est-mCnc: 117 mg/dL
Hgb A1c MFr Bld: 5.7 % — ABNORMAL HIGH (ref 4.8–5.6)

## 2022-05-09 LAB — VITAMIN B12: Vitamin B-12: 267 pg/mL (ref 232–1245)

## 2022-05-09 LAB — VITAMIN D 25 HYDROXY (VIT D DEFICIENCY, FRACTURES): Vit D, 25-Hydroxy: 16.5 ng/mL — ABNORMAL LOW (ref 30.0–100.0)

## 2022-05-09 MED ORDER — ERGOCALCIFEROL 1.25 MG (50000 UT) PO CAPS: 50000.0000 [IU] | ORAL_CAPSULE | ORAL | 1 refills | Status: DC

## 2022-05-09 NOTE — Telephone Encounter (Signed)
The ED used the diagnosis of radiculopathy due to her complaining of numbness.  She had a CT scan of her cervical spine in 2022 which did not show any disc disorder.

## 2022-05-10 ENCOUNTER — Ambulatory Visit (HOSPITAL_COMMUNITY): Payer: Medicare HMO | Admitting: Licensed Clinical Social Worker

## 2022-05-10 NOTE — Telephone Encounter (Signed)
Called patient and wants to know why her whole body locked up and wants a ct scan done to make sure.

## 2022-05-10 NOTE — Telephone Encounter (Signed)
Unfortunately I do not have the answer and I am unable to justify indication for CT scan for that.  If she has symptoms she needs to go to the emergency room.

## 2022-05-15 ENCOUNTER — Other Ambulatory Visit (HOSPITAL_COMMUNITY): Payer: Self-pay | Admitting: Psychiatry

## 2022-05-15 DIAGNOSIS — F250 Schizoaffective disorder, bipolar type: Secondary | ICD-10-CM

## 2022-05-15 DIAGNOSIS — F1210 Cannabis abuse, uncomplicated: Secondary | ICD-10-CM

## 2022-05-15 DIAGNOSIS — F4312 Post-traumatic stress disorder, chronic: Secondary | ICD-10-CM

## 2022-05-15 NOTE — Telephone Encounter (Signed)
Called patient and she is aware of note  

## 2022-05-30 ENCOUNTER — Telehealth: Payer: Self-pay | Admitting: Emergency Medicine

## 2022-05-30 NOTE — Telephone Encounter (Signed)
Copied from [place] [phone]. Topic: Referral - Status >> May 30, 2022  3:53 PM Cyndi Bender wrote: Reason for CRM: Pt stated she was denied by [facility] for the second time. Pt stated she was told that the referral was sent to [facility] but when she contacted them she was told that they did not have a referral for her. Pt requests call back to advise what is going on. Cb# [phone]

## 2022-05-31 ENCOUNTER — Other Ambulatory Visit: Payer: Self-pay | Admitting: Family Medicine

## 2022-05-31 DIAGNOSIS — M069 Rheumatoid arthritis, unspecified: Secondary | ICD-10-CM

## 2022-05-31 DIAGNOSIS — M797 Fibromyalgia: Secondary | ICD-10-CM

## 2022-05-31 NOTE — Telephone Encounter (Signed)
Requested Prescriptions  Pending Prescriptions Disp Refills   propranolol (INDERAL) 20 MG tablet [Pharmacy Med Name: propranolol 20 mg tablet] 120 tablet 3    Sig: TAKE TWO TABLETS BY MOUTH TWICE DAILY     Cardiovascular:  Beta Blockers Failed - 05/31/2022 12:23 PM      Failed - Last BP in normal range    BP Readings from Last 1 Encounters:  05/08/22 (!) 141/88         Passed - Last Heart Rate in normal range    Pulse Readings from Last 1 Encounters:  05/08/22 65         Passed - Valid encounter within last 6 months    Recent Outpatient Visits           3 weeks ago Type 2 diabetes mellitus with other specified complication, without long-term current use of insulin ([place])   [place], [place], MD   4 months ago Type 2 diabetes mellitus with other specified complication, without long-term current use of insulin ([place])   [place], [place], MD   7 months ago Type 2 diabetes mellitus with other specified complication, without long-term current use of insulin ([place])   [place], Charlane Ferretti, MD   1 year ago Rheumatoid arthritis involving multiple sites, unspecified whether rheumatoid factor present [facility])   [place], [place], MD   1 year ago Other microscopic hematuria   [place], [place], MD       Future Appointments             In 1 month Charlott Rakes, MD [place]   In 5 months [place], [place], MD [place]             DULoxetine (CYMBALTA) 60 MG capsule [Pharmacy Med Name: duloxetine 60 mg capsule,delayed release] 30 capsule 6    Sig: Take 1 capsule (60 mg total) by mouth daily.     Psychiatry: Antidepressants - SNRI - duloxetine Failed - 05/31/2022 12:23 PM      Failed - Last BP in normal range    BP Readings from  Last 1 Encounters:  05/08/22 (!) 141/88         Passed - Cr in normal range and within 360 days    Creatinine, Ser  Date Value Ref Range Status  04/19/2022 0.74 0.44 - 1.00 mg/dL Final         Passed - eGFR is 30 or above and within 360 days    GFR calc Af Amer  Date Value Ref Range Status  03/17/2019 >60 >60 mL/min Final   GFR, Estimated  Date Value Ref Range Status  04/19/2022 >60 >60 mL/min Final    Comment:    (NOTE) Calculated using the CKD-EPI Creatinine Equation (2021)    GFR  Date Value Ref Range Status  02/01/2015 110.19 >60.00 mL/min Final   eGFR  Date Value Ref Range Status  10/25/2021 103 >59 mL/min/1.73 Final         Passed - Completed PHQ-2 or PHQ-9 in the last 360 days      Passed - Valid encounter within last 6 months    Recent Outpatient Visits           3 weeks ago Type 2 diabetes mellitus with other specified complication, without long-term current use of insulin (  [place])   [place], [place], MD   4 months ago Type 2 diabetes mellitus with other specified complication, without long-term current use of insulin ([place])   [place], [place], MD   7 months ago Type 2 diabetes mellitus with other specified complication, without long-term current use of insulin ([place])   [place], Charlane Ferretti, MD   1 year ago Rheumatoid arthritis involving multiple sites, unspecified whether rheumatoid factor present [facility])   [place], Enobong, MD   1 year ago Other microscopic hematuria   [place], MD       Future Appointments             In 1 month Charlott Rakes, MD [place]   In 5 months Charlott Rakes, MD [place]

## 2022-05-31 NOTE — Telephone Encounter (Signed)
Pt was called and informed that referral was placed on 05/28/22, she informed me that she has spoken with someone at the office to schedule an appointment.

## 2022-06-09 ENCOUNTER — Other Ambulatory Visit (HOSPITAL_COMMUNITY): Payer: Self-pay | Admitting: Psychiatry

## 2022-06-09 DIAGNOSIS — F250 Schizoaffective disorder, bipolar type: Secondary | ICD-10-CM

## 2022-06-09 DIAGNOSIS — F4312 Post-traumatic stress disorder, chronic: Secondary | ICD-10-CM

## 2022-06-18 ENCOUNTER — Telehealth (HOSPITAL_COMMUNITY): Payer: Medicare HMO | Admitting: Psychiatry

## 2022-06-19 ENCOUNTER — Encounter (HOSPITAL_COMMUNITY): Payer: Self-pay | Admitting: Psychiatry

## 2022-06-19 ENCOUNTER — Telehealth (HOSPITAL_BASED_OUTPATIENT_CLINIC_OR_DEPARTMENT_OTHER): Payer: Medicare HMO | Admitting: Psychiatry

## 2022-06-19 VITALS — Wt 167.0 lb

## 2022-06-19 DIAGNOSIS — F4312 Post-traumatic stress disorder, chronic: Secondary | ICD-10-CM

## 2022-06-19 DIAGNOSIS — F250 Schizoaffective disorder, bipolar type: Secondary | ICD-10-CM | POA: Diagnosis not present

## 2022-06-19 DIAGNOSIS — F1210 Cannabis abuse, uncomplicated: Secondary | ICD-10-CM | POA: Diagnosis not present

## 2022-06-19 DIAGNOSIS — R69 Illness, unspecified: Secondary | ICD-10-CM | POA: Diagnosis not present

## 2022-06-19 MED ORDER — QUETIAPINE FUMARATE 50 MG PO TABS: 50.0000 mg | ORAL_TABLET | Freq: Every day | ORAL | 2 refills | Status: DC

## 2022-06-19 MED ORDER — BUSPIRONE HCL 5 MG PO TABS: 5.0000 mg | ORAL_TABLET | Freq: Two times a day (BID) | ORAL | 2 refills | Status: DC

## 2022-06-19 NOTE — Progress Notes (Signed)
Virtual Visit via Telephone Note  I connected with Victoria Holmes on 06/19/22 at  8:40 AM EST by telephone and verified that I am speaking with the correct person using two identifiers.  Location: Patient: Home Provider: Home Office   I discussed the limitations, risks, security and privacy concerns of performing an evaluation and management service by telephone and the availability of in person appointments. I also discussed with the patient that there may be a patient responsible charge related to this service. The patient expressed understanding and agreed to proceed.   History of Present Illness: Patient is evaluated by phone session.  Her video camera was not working.  She admitted since taking the medication most of the time she is feeling better.  Her sleep is improved and she has less frequent nightmares and flashback.  She liked the Seroquel because it helps her sleep.  We also started BuSpar on the last visit but she takes 1 tablet a day rather than 2 times a day.  She noticed it helps her anxiety and sometimes she takes a second dose when she feels very nervous and anxious.  She still have chronic hallucinations when she think about her living situation but denies any suicidal thoughts, homicidal thoughts.  Her lease is extended until December 31 and she has some time to look around and to get some help from [employer].  Patient had a visit to the emergency room on September 29 because of chest pain and she had cardiac workup which was negative.  She admitted not able to keep the appointment with Margreta Journey but having upcoming appointment to see her and she would like to keep that appointment.  She also had blood work in October and her hemoglobin A1c is 5.7 which is less than her previous reading.  She is on Ozempic.  Her vitamin D level was low.  She feels combination of medicine helping her and symptoms are manageable.  Her speech is more coherent today.  She denies any irritability,  mania, anger.  She still have some time crying spells when she think about her living situation.  Her plan is to stay to herself on Christmas.  Patient also cut down her cannabis use since she feel medicine helping her symptoms.   Past Psychiatric History: H/O PTSD, schizoaffective disorder, and bipolar disorder.  H/O cutting wrist and neck in 2007 and multiple inpatient.  Seroquel helped but higher doses made groggy. Tried Depakote but no details. Tried Ativan and Xanax.  Zoloft, Klonopin had side effects.  As per chart she also had tried Prozac, Concerta, Lexapro.  Has seen family services of [place] and [place].   Recent Results (from the past 2160 hour(s))  Troponin I (High Sensitivity)     Status: None   Collection Time: 04/02/22  9:10 PM  Result Value Ref Range   Troponin I (High Sensitivity) 3 <18 ng/L    Comment: (NOTE) Elevated high sensitivity troponin I (hsTnI) values and significant  changes across serial measurements may suggest ACS but many other  chronic and acute conditions are known to elevate hsTnI results.  Refer to the "Links" section for chest pain algorithms and additional  guidance. Performed at [facility], [place] [address], [place], [place] [address]   Basic metabolic panel     Status: Abnormal   Collection Time: 04/02/22  9:10 PM  Result Value Ref Range   Sodium 140 135 - 145 mmol/L   Potassium 3.4 (L) 3.5 - 5.1 mmol/L  Chloride 107 98 - 111 mmol/L   CO2 21 (L) 22 - 32 mmol/L   Glucose, Bld 114 (H) 70 - 99 mg/dL    Comment: Glucose reference range applies only to samples taken after fasting for at least 8 hours.   BUN 7 6 - 20 mg/dL   Creatinine, Ser 0.87 0.44 - 1.00 mg/dL   Calcium 10.0 8.9 - 10.3 mg/dL   GFR, Estimated >60 >60 mL/min    Comment: (NOTE) Calculated using the CKD-EPI Creatinine Equation (2021)    Anion gap 12 5 - 15    Comment: Performed at [facility] [address], [place], [place] [address]  CBC with  Differential     Status: Abnormal   Collection Time: 04/02/22  9:10 PM  Result Value Ref Range   WBC 5.3 4.0 - 10.5 K/uL   RBC 5.22 (H) 3.87 - 5.11 MIL/uL   Hemoglobin 13.9 12.0 - 15.0 g/dL   HCT 41.6 36.0 - 46.0 %   MCV 79.7 (L) 80.0 - 100.0 fL   MCH 26.6 26.0 - 34.0 pg   MCHC 33.4 30.0 - 36.0 g/dL   RDW 14.6 11.5 - 15.5 %   Platelets 246 150 - 400 K/uL   nRBC 0.0 0.0 - 0.2 %   Neutrophils Relative % 30 %   Neutro Abs 1.6 (L) 1.7 - 7.7 K/uL   Lymphocytes Relative 60 %   Lymphs Abs 3.2 0.7 - 4.0 K/uL   Monocytes Relative 7 %   Monocytes Absolute 0.4 0.1 - 1.0 K/uL   Eosinophils Relative 2 %   Eosinophils Absolute 0.1 0.0 - 0.5 K/uL   Basophils Relative 1 %   Basophils Absolute 0.0 0.0 - 0.1 K/uL   Immature Granulocytes 0 %   Abs Immature Granulocytes 0.00 0.00 - 0.07 K/uL    Comment: Performed at [facility], [place] [address], [place], [place] [address]  Urinalysis, Routine w reflex microscopic     Status: Abnormal   Collection Time: 04/19/22 11:46 AM  Result Value Ref Range   Color, Urine STRAW (A) YELLOW   APPearance CLEAR CLEAR   Specific Gravity, Urine 1.002 (L) 1.005 - 1.030   pH 8.0 5.0 - 8.0   Glucose, UA NEGATIVE NEGATIVE mg/dL   Hgb urine dipstick NEGATIVE NEGATIVE   Bilirubin Urine NEGATIVE NEGATIVE   Ketones, ur NEGATIVE NEGATIVE mg/dL   Protein, ur NEGATIVE NEGATIVE mg/dL   Nitrite NEGATIVE NEGATIVE   Leukocytes,Ua NEGATIVE NEGATIVE    Comment: Performed at [facility] [address] [place], [place] [address]  CBC with Differential     Status: None   Collection Time: 04/19/22 11:59 AM  Result Value Ref Range   WBC 6.0 4.0 - 10.5 K/uL   RBC 4.93 3.87 - 5.11 MIL/uL   Hemoglobin 13.2 12.0 - 15.0 g/dL   HCT 41.0 36.0 - 46.0 %   MCV 83.2 80.0 - 100.0 fL   MCH 26.8 26.0 - 34.0 pg   MCHC 32.2 30.0 - 36.0 g/dL   RDW 15.1 11.5 - 15.5 %   Platelets 231 150 - 400 K/uL   nRBC 0.0 0.0 - 0.2 %   Neutrophils Relative % 59 %   Neutro Abs 3.5 1.7 - 7.7  K/uL   Lymphocytes Relative 32 %   Lymphs Abs 1.9 0.7 - 4.0 K/uL   Monocytes Relative 7 %   Monocytes Absolute 0.4 0.1 - 1.0 K/uL   Eosinophils Relative 2 %  Eosinophils Absolute 0.1 0.0 - 0.5 K/uL   Basophils Relative 0 %   Basophils Absolute 0.0 0.0 - 0.1 K/uL   Immature Granulocytes 0 %   Abs Immature Granulocytes 0.02 0.00 - 0.07 K/uL    Comment: Performed at [facility], [place] [address], [place], [place] [address]  Comprehensive metabolic panel     Status: Abnormal   Collection Time: 04/19/22 11:59 AM  Result Value Ref Range   Sodium 145 135 - 145 mmol/L   Potassium 4.4 3.5 - 5.1 mmol/L   Chloride 110 98 - 111 mmol/L   CO2 26 22 - 32 mmol/L   Glucose, Bld 111 (H) 70 - 99 mg/dL    Comment: Glucose reference range applies only to samples taken after fasting for at least 8 hours.   BUN <5 (L) 6 - 20 mg/dL   Creatinine, Ser 0.74 0.44 - 1.00 mg/dL   Calcium 9.9 8.9 - 10.3 mg/dL   Total Protein 7.9 6.5 - 8.1 g/dL   Albumin 4.2 3.5 - 5.0 g/dL   AST 22 15 - 41 U/L   ALT 31 0 - 44 U/L   Alkaline Phosphatase 65 38 - 126 U/L   Total Bilirubin 0.5 0.3 - 1.2 mg/dL   GFR, Estimated >60 >60 mL/min    Comment: (NOTE) Calculated using the CKD-EPI Creatinine Equation (2021)    Anion gap 9 5 - 15    Comment: Performed at [facility] [address], [place], [place] [address]  Magnesium     Status: None   Collection Time: 04/19/22 11:59 AM  Result Value Ref Range   Magnesium 2.2 1.7 - 2.4 mg/dL    Comment: Performed at [facility] [address], [place], [place] [address]  Troponin I (High Sensitivity)     Status: None   Collection Time: 04/19/22 11:59 AM  Result Value Ref Range   Troponin I (High Sensitivity) <2 <18 ng/L    Comment: (NOTE) Elevated high sensitivity troponin I (hsTnI) values and significant  changes across serial measurements may suggest ACS but many other  chronic and acute conditions are known to elevate hsTnI results.  Refer to the "Links"  section for chest pain algorithms and additional  guidance. Performed at [facility], [place] [address], [place], [place] [address]   Troponin I (High Sensitivity)     Status: None   Collection Time: 04/19/22  5:57 PM  Result Value Ref Range   Troponin I (High Sensitivity) 2 <18 ng/L    Comment: (NOTE) Elevated high sensitivity troponin I (hsTnI) values and significant  changes across serial measurements may suggest ACS but many other  chronic and acute conditions are known to elevate hsTnI results.  Refer to the "Links" section for chest pain algorithms and additional  guidance. Performed at [facility], [place] [address], [place], [place] [address]   POCT glucose (manual entry)     Status: Abnormal   Collection Time: 05/08/22  9:36 AM  Result Value Ref Range   POC Glucose 129 (A) 70 - 99 mg/dl  Vitamin B12     Status: None   Collection Time: 05/08/22 10:16 AM  Result Value Ref Range   Vitamin B-12 267 232 - 1,245 pg/mL  VITAMIN D 25 Hydroxy (Vit-D Deficiency, Fractures)     Status: Abnormal   Collection Time: 05/08/22 10:16 AM  Result Value Ref Range   Vit D, 25-Hydroxy 16.5 (L) 30.0 - 100.0 ng/mL  Comment: Vitamin D deficiency has been defined by the [place] practice guideline as a level of serum 25-OH vitamin D less than 20 ng/mL (1,2). The Endocrine Society went on to further define vitamin D insufficiency as a level between 21 and 29 ng/mL (2). 1. IOM (Institute of Medicine). 2010. Dietary reference    intakes for calcium and D. [place]: The    [employer]. 2. Holick MF, Binkley [place], Bischoff-Ferrari HA, et al.    Evaluation, treatment, and prevention of vitamin D    deficiency: an Endocrine Society clinical practice    guideline. JCEM. 2011 Jul; 96(7):1911-30.   Folate     Status: None   Collection Time: 05/08/22 10:16 AM  Result Value Ref Range   Folate 7.5 >3.0 ng/mL    Comment: A serum folate  concentration of less than 3.1 ng/mL is considered to represent clinical deficiency.   Hemoglobin A1c     Status: Abnormal   Collection Time: 05/08/22 10:16 AM  Result Value Ref Range   Hgb A1c MFr Bld 5.7 (H) 4.8 - 5.6 %    Comment:          Prediabetes: 5.7 - 6.4          Diabetes: >6.4          Glycemic control for adults with diabetes: <7.0    Est. average glucose Bld gHb Est-mCnc 117 mg/dL      Psychiatric Specialty Exam: Physical Exam  Review of Systems  Weight 167 lb (75.8 kg).There is no height or weight on file to calculate BMI.  General Appearance: NA  Eye Contact:  NA  Speech:  Slow  Volume:  Normal  Mood:  Anxious  Affect:  NA  Thought Process:  Goal Directed  Orientation:  Full (Time, Place, and Person)  Thought Content:  Rumination  Suicidal Thoughts:  No  Homicidal Thoughts:  No  Memory:  Immediate;   Good Recent;   Fair Remote;   Fair  Judgement:  Fair  Insight:  Fair  Psychomotor Activity:  NA  Concentration:  Concentration: Fair and Attention Span: Fair  Recall:  Good  Fund of Knowledge:  Good  Language:  Good  Akathisia:  No  Handed:  Right  AIMS (if indicated):     Assets:  Communication Skills Desire for Improvement  ADL's:  Intact  Cognition:  WNL  Sleep:   improved      Assessment and Plan: Schizoaffective disorder, bipolar type.  PTSD.  Cannabis use.  Since patient taking the Seroquel regularly it is helping her sleep, nightmares and paranoia.  She also noticed less intense hallucinations.  I reviewed blood work results.  Her hemoglobin A1c is 5.7.  She is hoping to resolve the living situation.  Her lease is extended to December 31.  She has cut down her cannabis use.  I encouraged her to take the BuSpar twice a day as patient only taking 1 a day.  I explained if we will help her anxiety.  I also encouraged to keep the appointment with [facility].  Discussed safety concern that anytime having active suicidal thoughts or homicidal thought  then she need to call 911 or go to local emergency room.  Continue BuSpar 5 mg and advised to take 5 mg 2 times a day, continue Seroquel 50 mg at bedtime.  She is getting Cymbalta from her PCP for fibromyalgia and chronic pain.  I also encouraged to keep appointment with  her other health care provider for her health needs.  Follow-up in 3 months.  Follow Up Instructions:    I discussed the assessment and treatment plan with the patient. The patient was provided an opportunity to ask questions and all were answered. The patient agreed with the plan and demonstrated an understanding of the instructions.   The patient was advised to call back or seek an in-person evaluation if the symptoms worsen or if the condition fails to improve as anticipated.  Collaboration of Care: Other provider involved in patient's care AEB notes are available in epic to review.  Patient/Guardian was advised Release of Information must be obtained prior to any record release in order to collaborate their care with an outside provider. Patient/Guardian was advised if they have not already done so to contact the registration department to sign all necessary forms in order for [place] to release information regarding their care.   Consent: Patient/Guardian gives verbal consent for treatment and assignment of benefits for services provided during this visit. Patient/Guardian expressed understanding and agreed to proceed.    I provided 25 minutes of non-face-to-face time during this encounter.   Kathlee Nations, MD

## 2022-06-22 ENCOUNTER — Ambulatory Visit (INDEPENDENT_AMBULATORY_CARE_PROVIDER_SITE_OTHER): Payer: Medicare HMO | Admitting: Licensed Clinical Social Worker

## 2022-06-22 DIAGNOSIS — F250 Schizoaffective disorder, bipolar type: Secondary | ICD-10-CM | POA: Diagnosis not present

## 2022-06-22 DIAGNOSIS — F4312 Post-traumatic stress disorder, chronic: Secondary | ICD-10-CM | POA: Diagnosis not present

## 2022-06-22 DIAGNOSIS — R69 Illness, unspecified: Secondary | ICD-10-CM | POA: Diagnosis not present

## 2022-06-22 NOTE — Progress Notes (Unsigned)
omit 

## 2022-06-23 NOTE — Progress Notes (Signed)
Virtual Visit via Video Note  I connected with Victoria Holmes on 06/23/22 at 10:00 AM EST by a video enabled telemedicine application and verified that I am speaking with the correct person using two identifiers.  Location: Patient: home Provider: remote office [place], [place])   I discussed the limitations of evaluation and management by telemedicine and the availability of in person appointments. The patient expressed understanding and agreed to proceed.  I discussed the assessment and treatment plan with the patient. The patient was provided an opportunity to ask questions and all were answered. The patient agreed with the plan and demonstrated an understanding of the instructions.   The patient was advised to call back or seek an in-person evaluation if the symptoms worsen or if the condition fails to improve as anticipated.  I provided 60 minutes of non-face-to-face time during this encounter.   [name] R [name], LCSW   THERAPIST PROGRESS NOTE  Session Time: 10-11a  Participation Level: Active  Behavioral Response: NeatAlertAngry, Anxious, and Irritable  Type of Therapy: Individual Therapy  Treatment Goals addressed: Problem: PTSD-Trauma  Goal: Reduce the negative impact trauma related symptoms have on social, occupational, and family functioning per pt self report 3 out of 5 sessions documented.  Outcome: Not Progressing Note: Recently triggered by holidays/family gatherings Goal: Reduction in intrusive event recollections, avoidance of event reminders, intense arousal, or disinterest in activities or relationships per pt self report 3 out of 5 sessions documented Outcome: Not Progressing Intervention: Assist with relaxation techniques, as appropriate (deep breathing exercises, meditation, guided imagery) Note: Reviewed emotion regulation skills   Problem: Depression  Goal:  Decrease depressive symptoms and improve levels of effective functioning-pt reports a decrease  in overall depression symptoms 3 out of 5 sessions documented.  Outcome: Progressing Goal: Develop healthy thinking patterns and beliefs about self, others, and the world that lead to the alleviation and help prevent the relapse of depression per self report 3 out of 5 sessions documented.   Outcome: Progressing Intervention: REVIEW PLEASE SKILLS (TREAT PHYSICAL ILLNESS, BALANCE EATING, AVOID MOOD-ALTERING SUBSTANCES, BALANCE SLEEP AND GET EXERCISE) WITH Abegail Note: Reviewed  Intervention: Encourage compliance with prescribed medication regimen Note: Encouraged continuing med compliance   Problem: Bipolar Disorder Goal: Improve controlled behavior, moderated mood, more deliberative speech and thought process, and a stable daily activity pattern per pt self report 3 out of 5 sessions documented Outcome: Progressing Note: Pt reports that she has controlled her anger in several scenarios   ProgressTowards Goals: Progressing  Interventions: CBT, Motivational Interviewing, Supportive, and Anger Management Training  Summary: Victoria Henandez is a 41 y.o. female who presents with symptoms consistent with schizoaffective disorder and chronic PTSD.  Pt reports that she is compliant with her medication and that she is trying hard to manage situational triggers. .   Allowed pt to explore and express thoughts and feelings associated with recent life situations and external stressors.Pt reports continuing stress associated with her current financial and living situation. Pt reports that she is currently working with [place] to extend her lease. Pt is not sure where she stands or whether she will be able to stay where she is. "I have only paid partial rent, not whole rent".  Pt presents as very frustrated and angry at her caseworker "she is a Control and instrumentation engineer and it pisses me off".  Pt states its taking a lot of energy to "keep me from putting my hands on somebody".   Pt states that the Thanksgiving  holiday was a  trigger because pt doesn't enjoy being around family members. Pt stated that she did go and let limits--pt was only around people for a couple of hours. Pt is glad that she did make an appearance. Allowed pt safe space to explore thoughts/feelings about relationships w/ family members. Discussed continuing to set limits/boundaries when pt is in areas where she doesn't feel safe. Including all public spaces.  Pt states her godsister is someone very close and pt trusts her help. Encouraged pt to reach out to her for social engagement/supports on a regular basis.   Reviewed anger management strategies to help pt manage/regulate emotions in the moment.  Pt states she has an alter named "Shenille" and nobody wants "Shenille" to come out--she's very angry. Allowed pt to identify ways that she can keep anger regulated to stay within "[name]" personality and not "Shenille".   Continued recommendations are as follows: self care behaviors, positive social engagements, focusing on overall work/home/life balance, and focusing on positive physical and emotional wellness.   Suicidal/Homicidal: No  Therapist Response: Pt is continuing to apply interventions learned in session into daily life situations. Pt is currently on track to meet goals utilizing interventions mentioned above. Personal growth and progress noted. Treatment to continue as indicated.   Pt presents with very labile affect--angry, irritable, frustrated, sad/crying.  Plan: Return again in 4 weeks.  Diagnosis:  Encounter Diagnoses  Name Primary?   Schizoaffective disorder, bipolar type (HCC) Yes   Chronic post-traumatic stress disorder (PTSD)    Collaboration of Care: Other pt encouraged to continue care with psychiatrist of record, Dr. Berniece Andreas  Patient/Guardian was advised Release of Information must be obtained prior to any record release in order to collaborate their care with an outside provider. Patient/Guardian was  advised if they have not already done so to contact the registration department to sign all necessary forms in order for [place] to release information regarding their care.   Consent: Patient/Guardian gives verbal consent for treatment and assignment of benefits for services provided during this visit. Patient/Guardian expressed understanding and agreed to proceed.   [place], LCSW 06/23/2022

## 2022-06-23 NOTE — Plan of Care (Signed)
  Problem: PTSD-Trauma  Goal: Reduce the negative impact trauma related symptoms have on social, occupational, and family functioning per pt self report 3 out of 5 sessions documented.  Outcome: Not Progressing Note: Recently triggered by holidays/family gatherings Goal: Reduction in intrusive event recollections, avoidance of event reminders, intense arousal, or disinterest in activities or relationships per pt self report 3 out of 5 sessions documented Outcome: Not Progressing Intervention: Assist with relaxation techniques, as appropriate (deep breathing exercises, meditation, guided imagery) Note: Reviewed emotion regulation skills   Problem: Depression  Goal:  Decrease depressive symptoms and improve levels of effective functioning-pt reports a decrease in overall depression symptoms 3 out of 5 sessions documented.  Outcome: Progressing Goal: Develop healthy thinking patterns and beliefs about self, others, and the world that lead to the alleviation and help prevent the relapse of depression per self report 3 out of 5 sessions documented.   Outcome: Progressing Intervention: REVIEW PLEASE SKILLS (TREAT PHYSICAL ILLNESS, BALANCE EATING, AVOID MOOD-ALTERING SUBSTANCES, BALANCE SLEEP AND GET EXERCISE) WITH Aud[facility] Note: Reviewed  Intervention: Encourage compliance with prescribed medication regimen Note: Encouraged continuing med compliance   Problem: Bipolar Disorder Goal: Improve controlled behavior, moderated mood, more deliberative speech and thought process, and a stable daily activity pattern per pt self report 3 out of 5 sessions documented Outcome: Progressing Note: Pt reports that she has controlled her anger in several scenarios

## 2022-06-25 ENCOUNTER — Ambulatory Visit: Payer: Medicare HMO | Attending: Family Medicine

## 2022-06-25 ENCOUNTER — Telehealth: Payer: Self-pay

## 2022-06-25 VITALS — Ht 62.0 in | Wt 160.0 lb

## 2022-06-25 DIAGNOSIS — Z0000 Encounter for general adult medical examination without abnormal findings: Secondary | ICD-10-CM | POA: Diagnosis not present

## 2022-06-25 NOTE — Patient Instructions (Signed)
[name] , Thank you for taking time to come for your Medicare Wellness Visit. I appreciate your ongoing commitment to your health goals. Please review the following plan we discussed and let me know if I can assist you in the future.   Screening recommendations/referrals: Colonoscopy: n/a Mammogram: completed 02/22/2021, due Bone Density: n/a Recommended yearly ophthalmology/optometry visit for glaucoma screening and checkup Recommended yearly dental visit for hygiene and checkup  Vaccinations: Influenza vaccine: decline Pneumococcal vaccine: n/a Tdap vaccine: completed 06/06/2020, due 06/06/2030 Shingles vaccine: n/a  Covid-19: completed  Advanced directives: Advance directive discussed with you today.   Conditions/risks identified: none  Next appointment: Follow up in one year for your annual wellness visit.   Preventive Care 40-64 Years, Female Preventive care refers to lifestyle choices and visits with your health care provider that can promote health and wellness. What does preventive care include? A yearly physical exam. This is also called an annual well check. Dental exams once or twice a year. Routine eye exams. Ask your health care provider how often you should have your eyes checked. Personal lifestyle choices, including: Daily care of your teeth and gums. Regular physical activity. Eating a healthy diet. Avoiding tobacco and drug use. Limiting alcohol use. Practicing safe sex. Taking low-dose aspirin daily starting at age 64. Taking vitamin and mineral supplements as recommended by your health care provider. What happens during an annual well check? The services and screenings done by your health care provider during your annual well check will depend on your age, overall health, lifestyle risk factors, and family history of disease. Counseling  Your health care provider may ask you questions about your: Alcohol use. Tobacco use. Drug use. Emotional  well-being. Home and relationship well-being. Sexual activity. Eating habits. Work and work Statistician. Method of birth control. Menstrual cycle. Pregnancy history. Screening  You may have the following tests or measurements: Height, weight, and BMI. Blood pressure. Lipid and cholesterol levels. These may be checked every 5 years, or more frequently if you are over 59 years old. Skin check. Lung cancer screening. You may have this screening every year starting at age 84 if you have a 30-pack-year history of smoking and currently smoke or have quit within the past 15 years. Fecal occult blood test (FOBT) of the stool. You may have this test every year starting at age 74. Flexible sigmoidoscopy or colonoscopy. You may have a sigmoidoscopy every 5 years or a colonoscopy every 10 years starting at age 86. Hepatitis C blood test. Hepatitis B blood test. Sexually transmitted disease (STD) testing. Diabetes screening. This is done by checking your blood sugar (glucose) after you have not eaten for a while (fasting). You may have this done every 1-3 years. Mammogram. This may be done every 1-2 years. Talk to your health care provider about when you should start having regular mammograms. This may depend on whether you have a family history of breast cancer. BRCA-related cancer screening. This may be done if you have a family history of breast, ovarian, tubal, or peritoneal cancers. Pelvic exam and Pap test. This may be done every 3 years starting at age 10. Starting at age 42, this may be done every 5 years if you have a Pap test in combination with an HPV test. Bone density scan. This is done to screen for osteoporosis. You may have this scan if you are at high risk for osteoporosis. Discuss your test results, treatment options, and if necessary, the need for more tests with your  health care provider. Vaccines  Your health care provider may recommend certain vaccines, such as: Influenza  vaccine. This is recommended every year. Tetanus, diphtheria, and acellular pertussis (Tdap, Td) vaccine. You may need a Td booster every 10 years. Zoster vaccine. You may need this after age 21. Pneumococcal 13-valent conjugate (PCV13) vaccine. You may need this if you have certain conditions and were not previously vaccinated. Pneumococcal polysaccharide (PPSV23) vaccine. You may need one or two doses if you smoke cigarettes or if you have certain conditions. Talk to your health care provider about which screenings and vaccines you need and how often you need them. This information is not intended to replace advice given to you by your health care provider. Make sure you discuss any questions you have with your health care provider. Document Released: 08/05/2015 Document Revised: 03/28/2016 Document Reviewed: 05/10/2015 Elsevier Interactive Patient Education  2017 [place] Prevention in the Home Falls can cause injuries. They can happen to people of all ages. There are many things you can do to make your home safe and to help prevent falls. What can I do on the outside of my home? Regularly fix the edges of walkways and driveways and fix any cracks. Remove anything that might make you trip as you walk through a door, such as a raised step or threshold. Trim any bushes or trees on the path to your home. Use bright outdoor lighting. Clear any walking paths of anything that might make someone trip, such as rocks or tools. Regularly check to see if handrails are loose or broken. Make sure that both sides of any steps have handrails. Any raised decks and porches should have guardrails on the edges. Have any leaves, snow, or ice cleared regularly. Use sand or salt on walking paths during winter. Clean up any spills in your garage right away. This includes oil or grease spills. What can I do in the bathroom? Use night lights. Install grab bars by the toilet and in the tub and  shower. Do not use towel bars as grab bars. Use non-skid mats or decals in the tub or shower. If you need to sit down in the shower, use a plastic, non-slip stool. Keep the floor dry. Clean up any water that spills on the floor as soon as it happens. Remove soap buildup in the tub or shower regularly. Attach bath mats securely with double-sided non-slip rug tape. Do not have throw rugs and other things on the floor that can make you trip. What can I do in the bedroom? Use night lights. Make sure that you have a light by your bed that is easy to reach. Do not use any sheets or blankets that are too big for your bed. They should not hang down onto the floor. Have a firm chair that has side arms. You can use this for support while you get dressed. Do not have throw rugs and other things on the floor that can make you trip. What can I do in the kitchen? Clean up any spills right away. Avoid walking on wet floors. Keep items that you use a lot in easy-to-reach places. If you need to reach something above you, use a strong step stool that has a grab bar. Keep electrical cords out of the way. Do not use floor polish or wax that makes floors slippery. If you must use wax, use non-skid floor wax. Do not have throw rugs and other things on the floor that  can make you trip. What can I do with my stairs? Do not leave any items on the stairs. Make sure that there are handrails on both sides of the stairs and use them. Fix handrails that are broken or loose. Make sure that handrails are as long as the stairways. Check any carpeting to make sure that it is firmly attached to the stairs. Fix any carpet that is loose or worn. Avoid having throw rugs at the top or bottom of the stairs. If you do have throw rugs, attach them to the floor with carpet tape. Make sure that you have a light switch at the top of the stairs and the bottom of the stairs. If you do not have them, ask someone to add them for  you. What else can I do to help prevent falls? Wear shoes that: Do not have high heels. Have rubber bottoms. Are comfortable and fit you well. Are closed at the toe. Do not wear sandals. If you use a stepladder: Make sure that it is fully opened. Do not climb a closed stepladder. Make sure that both sides of the stepladder are locked into place. Ask someone to hold it for you, if possible. Clearly mark and make sure that you can see: Any grab bars or handrails. First and last steps. Where the edge of each step is. Use tools that help you move around (mobility aids) if they are needed. These include: Canes. Walkers. Scooters. Crutches. Turn on the lights when you go into a dark area. Replace any light bulbs as soon as they burn out. Set up your furniture so you have a clear path. Avoid moving your furniture around. If any of your floors are uneven, fix them. If there are any pets around you, be aware of where they are. Review your medicines with your doctor. Some medicines can make you feel dizzy. This can increase your chance of falling. Ask your doctor what other things that you can do to help prevent falls. This information is not intended to replace advice given to you by your health care provider. Make sure you discuss any questions you have with your health care provider. Document Released: 05/05/2009 Document Revised: 12/15/2015 Document Reviewed: 08/13/2014 Elsevier Interactive Patient Education  2017 [employer].

## 2022-06-25 NOTE — Progress Notes (Signed)
I connected with Victoria Holmes today by telephone and verified that I am speaking with the correct person using two identifiers. Location patient: home Location provider: work Persons participating in the virtual visit: Victoria Holmes, Glenna Durand LPN.   I discussed the limitations, risks, security and privacy concerns of performing an evaluation and management service by telephone and the availability of in person appointments. I also discussed with the patient that there may be a patient responsible charge related to this service. The patient expressed understanding and verbally consented to this telephonic visit.    Interactive audio and video telecommunications were attempted between this provider and patient, however failed, due to patient having technical difficulties OR patient did not have access to video capability.  We continued and completed visit with audio only.     Vital signs may be patient reported or missing.  Subjective:   Victoria Holmes is a 41 y.o. female who presents for Medicare Annual (Subsequent) preventive examination.  Review of Systems     Cardiac Risk Factors include: diabetes mellitus;hypertension     Objective:    Today's Vitals   06/25/22 0911 06/25/22 0912  Weight: 160 lb (72.6 kg)   Height: _0  (1.575 m)   PainSc:  8    Body mass index is 29.26 kg/m.     06/25/2022    9:22 AM 04/19/2022   11:44 AM 04/02/2022    8:58 PM 05/31/2021   11:07 AM 03/27/2021    2:22 PM 04/18/2017    6:01 AM 04/15/2012   12:04 PM  Advanced Directives  Does Patient Have a Medical Advance Directive? _1  No Patient does not have advance directive;Patient would not like information  Would patient like information on creating a medical advance directive?  No - Patient declined  Yes (MAU/Ambulatory/Procedural Areas - Information given) No - Guardian declined No - Patient declined     Current Medications (verified) Outpatient Encounter Medications as of 06/25/2022   Medication Sig   Accu-Chek Softclix Lancets lancets Use to check blood sugar three times daily. E11.65   amLODipine (NORVASC) 5 MG tablet Take 1 tablet (5 mg total) by mouth daily.   atorvastatin (LIPITOR) 20 MG tablet Take 1 tablet (20 mg total) by mouth daily.   Blood Glucose Monitoring Suppl (ACCU-CHEK GUIDE ME) w/Device KIT Use to check blood sugar three times daily. E11.65   busPIRone (BUSPAR) 5 MG tablet Take 1 tablet (5 mg total) by mouth 2 (two) times daily.   DULoxetine (CYMBALTA) 60 MG capsule Take 1 capsule (60 mg total) by mouth daily.   ergocalciferol (DRISDOL) 1.25 MG (50000 UT) capsule Take 1 capsule (50,000 Units total) by mouth once a week.   glucose blood (ACCU-CHEK GUIDE) test strip Use to check blood sugar three times daily. E11.65   hydroxychloroquine (PLAQUENIL) 200 MG tablet Take 200 mg by mouth 2 (two) times daily.   ibuprofen (ADVIL,MOTRIN) 200 MG tablet Take 200 mg by mouth every 6 (six) hours as needed for fever, headache, mild pain, moderate pain or cramping.   metFORMIN (GLUCOPHAGE) 500 MG tablet Take 1 tablet (500 mg total) by mouth daily with breakfast.   pregabalin (LYRICA) 100 MG capsule Take 1 capsule (100 mg total) by mouth 2 (two) times daily.   propranolol (INDERAL) 20 MG tablet TAKE TWO TABLETS BY MOUTH TWICE DAILY   QUEtiapine (SEROQUEL) 50 MG tablet Take 1 tablet (50 mg total) by mouth at bedtime.   Semaglutide,0.25 or 0.5MG/DOS, (OZEMPIC, 0.25 OR 0.5  MG/DOSE,) 2 MG/1.5ML SOPN Inject 0.25 mg into the skin once a week.   tiZANidine (ZANAFLEX) 4 MG tablet Take 1 tablet (4 mg total) by mouth every 8 (eight) hours as needed for muscle spasms.   valACYclovir (VALTREX) 1000 MG tablet Take 1,000 mg by mouth daily.   bismuth subsalicylate (PEPTO-BISMOL) 262 MG/15ML suspension 30 mls PO Q 6hrs x 10 days (Patient not taking: Reported on 11/07/2021)   bismuth-metronidazole-tetracycline (PYLERA) 140-125-125 MG capsule Take 3 capsules by mouth 4 (four) times daily -   before meals and at bedtime. (Patient not taking: Reported on 06/25/2022)   butalbital-acetaminophen-caffeine (FIORICET) 50-325-40 MG tablet Take 1-2 tablets by mouth every 6 (six) hours as needed for headache. (Patient not taking: Reported on 06/25/2022)   EPINEPHrine (EPIPEN JR) 0.15 MG/0.3ML injection Inject 0.15 mg into the muscle as needed. (Patient not taking: Reported on 06/25/2022)   lidocaine (LIDODERM) 5 % Place 1 patch onto the skin daily. Remove & Discard patch within 12 hours or as directed by MD (Patient not taking: Reported on 06/25/2022)   metroNIDAZOLE (FLAGYL) 250 MG tablet Take 1 tablet (250 mg total) by mouth 4 (four) times daily. (Patient not taking: Reported on 11/07/2021)   omeprazole (PRILOSEC) 20 MG capsule Take 1 capsule (20 mg total) by mouth 2 (two) times daily before a meal. (Patient not taking: Reported on 06/25/2022)   tetracycline (SUMYCIN) 500 MG capsule Take 1 capsule (500 mg total) by mouth 4 (four) times daily. (Patient not taking: Reported on 06/25/2022)   No facility-administered encounter medications on file as of 06/25/2022.    Allergies (verified) Amoxicillin, Azithromycin, Darvocet [propoxyphene n-acetaminophen], Grapeseed extract [nutritional supplements], Kiwi extract, Latex, Macrolides and ketolides, Morphine and related, Sulfa antibiotics, Klonopin [clonazepam], Penicillins, Zoloft [sertraline hcl], Contrast media [iodinated contrast media], Flagyl [metronidazole], Adhesive [tape], Concerta [methylphenidate], Lexapro [escitalopram oxalate], and Lortab [hydrocodone-acetaminophen]   History: Past Medical History:  Diagnosis Date   Anemia    no current med.   Anxiety    Chronic tonsillitis 03/2012   Depression    Diabetes mellitus    diet-controlled   Fibrocystic breast disease    Headache(784.0)    migraines   History of endometriosis    Hyperlipidemia    Hypertension    under control, has been on med. x 1 yr.   Lupus (HCC)    Lupus (HCC)     Neuritis    Panic attacks    Raynauds disease    Rheumatoid arthritis(714.0)    Schizoaffective disorder (HCC)    Seizures (HCC)    last seizure > 1 yr. ago   Sinusitis    Sleep disturbance    Thyroid nodule    Past Surgical History:  Procedure Laterality Date   ABDOMINAL HYSTERECTOMY  6 yrs. ago   complete   CHOLECYSTECTOMY  6-8 yrs. ago   FOOT SURGERY  05/2011   right great toe   TONSILLECTOMY  04/22/2012   Procedure: TONSILLECTOMY;  Surgeon: Rozetta Nunnery, MD;  Location: [facility];  Service: ENT;  Laterality: N/A;   Family History  Problem Relation Age of Onset   Colon cancer Father    Heart attack Maternal Grandmother        late 40's   Arthritis Maternal Grandfather    Cancer Maternal Grandfather        lung   Diabetes Paternal Grandmother    Hypertension Paternal Grandmother    Kidney disease Paternal Grandmother    Lupus Other    Pancreatic  cancer Neg Hx    Stomach cancer Neg Hx    Liver cancer Neg Hx    Esophageal cancer Neg Hx    Social History   Socioeconomic History   Marital status: Single    Spouse name: Not on file   Number of children: 1   Years of education: Not on file   Highest education level: Not on file  Occupational History   Occupation: [employer]  Tobacco Use   Smoking status: Former    Packs/day: 0.25    Years: 3.00    Total pack years: 0.75    Types: Cigarettes    Quit date: 01/2019    Years since quitting: 3.4   Smokeless tobacco: Never   Tobacco comments:    3 cig./day  Vaping Use   Vaping Use: Never used  Substance and Sexual Activity   Alcohol use: Yes    Alcohol/week: 0.0 standard drinks of alcohol    Comment: occasionally   Drug use: Yes    Types: Marijuana   Sexual activity: Not on file  Other Topics Concern   Not on file  Social History Narrative   Not on file   Social Determinants of Health   Financial Resource Strain: Low Risk  (06/25/2022)   Overall Financial Resource Strain (CARDIA)     Difficulty of Paying Living Expenses: Not hard at all  Food Insecurity: Food Insecurity Present (06/25/2022)   Hunger Vital Sign    Worried About Running Out of Food in the Last Year: Sometimes true    Ran Out of Food in the Last Year: Sometimes true  Transportation Needs: No Transportation Needs (06/25/2022)   PRAPARE - Hydrologist (Medical): No    Lack of Transportation (Non-Medical): No  Physical Activity: Inactive (06/25/2022)   Exercise Vital Sign    Days of Exercise per Week: 0 days    Minutes of Exercise per Session: 0 min  Stress: Stress Concern Present (06/25/2022)   [place]    Feeling of Stress : To some extent  Social Connections: Not on file    Tobacco Counseling Counseling given: Not Answered Tobacco comments: 3 cig./day   Clinical Intake:  Pre-visit preparation completed: Yes  Pain : 0-10 Pain Score: 8  Pain Type: Chronic pain Pain Location: Generalized Pain Descriptors / Indicators: Aching Pain Onset: More than a month ago Pain Frequency: Constant     Nutritional Status: BMI 25 -29 Overweight Nutritional Risks: Nausea/ vomitting/ diarrhea (past to days) Diabetes: Yes  How often do you need to have someone help you when you read instructions, pamphlets, or other written materials from your doctor or pharmacy?: 1 - Never  Diabetic? Yes Nutrition Risk Assessment:  Has the patient had any N/V/D within the last 2 months?  Yes  Does the patient have any non-healing wounds?  No  Has the patient had any unintentional weight loss or weight gain?  No   Diabetes:  Is the patient diabetic?  Yes  If diabetic, was a CBG obtained today?  No  Did the patient bring in their glucometer from home?  No  How often do you monitor your CBG's? daily.   Financial Strains and Diabetes Management:  Are you having any financial strains with the device, your supplies or your  medication? No .  Does the patient want to be seen by Chronic Care Management for management of their diabetes?  No  Would the patient like  to be referred to a Nutritionist or for Diabetic Management?  No   Diabetic Exams:  Diabetic Eye Exam: Overdue for diabetic eye exam. Pt has been advised about the importance in completing this exam. Patient advised to call and schedule an eye exam. Diabetic Foot Exam: Completed 01/16/2022   Interpreter Needed?: No  Information entered by :: NAllen LPN   Activities of Daily Living    06/25/2022    9:24 AM  In your present state of health, do you have any difficulty performing the following activities:  Hearing? 1  Comment decreased hearing left ear  Vision? 1  Comment vision blurry at times  Difficulty concentrating or making decisions? 1  Comment due to mind races  Walking or climbing stairs? 1  Dressing or bathing? 0  Doing errands, shopping? 0  Preparing Food and eating ? N  Using the Toilet? N  In the past six months, have you accidently leaked urine? Y  Do you have problems with loss of bowel control? N  Managing your Medications? N  Managing your Finances? N  Housekeeping or managing your Housekeeping? N    Patient Care Team: Charlott Rakes, MD as PCP - General (Family Medicine)  Indicate any recent Medical Services you may have received from other than [facility] providers in the past year (date may be approximate).     Assessment:   This is a routine wellness examination for [place].  Hearing/Vision screen Vision Screening - Comments:: Regular  eye exams, [facility]  Dietary issues and exercise activities discussed: Current Exercise Habits: The patient does not participate in regular exercise at present   Goals Addressed             This Visit's Progress    Patient Stated       06/25/2022, wants to lose weight and stay sane       Depression Screen    06/25/2022    9:23 AM 06/23/2022    8:40 AM 03/30/2022     3:07 PM 01/16/2022    4:36 PM 10/10/2021    4:42 PM 05/11/2021   10:52 AM 04/05/2021    3:01 PM  PHQ 2/9 Scores  PHQ - 2 Score _0 PHQ- 9 Score _1 Information is confidential and restricted. Go to Review Flowsheets to unlock data.    Fall Risk    06/25/2022    9:22 AM 05/08/2022    9:35 AM 01/16/2022    4:36 PM 05/11/2021   10:51 AM 11/09/2020    2:03 PM  Fall Risk   Falls in the past year? 1 0 0 0 0  Comment legs gave out      Number falls in past yr: 1 0 0 0 0  Injury with Fall? 0 0 0 0 0  Risk for fall due to : Medication side effect No Fall Risks     Follow up Falls prevention discussed;Education provided;Falls evaluation completed        FALL RISK PREVENTION PERTAINING TO THE HOME:  Any stairs in or around the home? Yes  If so, are there any without handrails? No  Home free of loose throw rugs in walkways, pet beds, electrical cords, etc? Yes  Adequate lighting in your home to reduce risk of falls? Yes   ASSISTIVE DEVICES UTILIZED TO PREVENT FALLS:  Life alert? No  Use of a cane, walker or w/c? No  Grab bars in the bathroom? No  Shower chair or bench in shower? No  Elevated toilet seat or a handicapped toilet? No   TIMED UP AND GO:  Was the test performed? No .      Cognitive Function:        06/25/2022    9:28 AM  6CIT Screen  What Year? 0 points  What month? 0 points  What time? 0 points  Count back from 20 0 points  Months in reverse 0 points  Repeat phrase 2 points  Total Score 2 points    Immunizations Immunization History  Administered Date(s) Administered   PNEUMOCOCCAL CONJUGATE-20 05/11/2021   Tdap 06/06/2020    TDAP status: Up to date  Flu Vaccine status: Declined, Education has been provided regarding the importance of this vaccine but patient still declined. Advised may receive this vaccine at local pharmacy or Health Dept. Aware to provide a copy of the vaccination record if obtained from local pharmacy  or Health Dept. Verbalized acceptance and understanding.  Pneumococcal vaccine status: Up to date  Covid-19 vaccine status: Completed vaccines  Qualifies for Shingles Vaccine? No   Zostavax completed  n/a   Shingrix Completed?: n/a  Screening Tests Health Maintenance  Topic Date Due   COVID-19 Vaccine (1) Never done   Medicare Annual Wellness (AWV)  12/19/2019   OPHTHALMOLOGY EXAM  03/14/2022   INFLUENZA VACCINE  10/21/2022 (Originally 02/20/2022)   HEMOGLOBIN A1C  11/07/2022   FOOT EXAM  01/17/2023   Diabetic kidney evaluation - Urine ACR  01/26/2023   Diabetic kidney evaluation - GFR measurement  04/20/2023   PAP SMEAR-Modifier  10/03/2024   DTaP/Tdap/Td (2 - Td or Tdap) 06/06/2030   Hepatitis C Screening  Completed   HIV Screening  Completed   HPV VACCINES  Aged Out    Health Maintenance  Health Maintenance Due  Topic Date Due   COVID-19 Vaccine (1) Never done   Medicare Annual Wellness (AWV)  12/19/2019   OPHTHALMOLOGY EXAM  03/14/2022    Colorectal cancer screening: No longer required.   Mammogram status: Completed 02/22/2021. Repeat every year  Bone Density status: n/a  Lung Cancer Screening: (Low Dose CT Chest recommended if Age 65-80 years, 30 pack-year currently smoking OR have quit w/in 15years.) does not qualify.   Lung Cancer Screening Referral: no  Additional Screening:  Hepatitis C Screening: does qualify; Completed 10/25/2021  Vision Screening: Recommended annual ophthalmology exams for early detection of glaucoma and other disorders of the eye. Is the patient up to date with their annual eye exam?  Yes  Who is the provider or what is the name of the office in which the patient attends annual eye exams? [facility] If pt is not established with a provider, would they like to be referred to a provider to establish care? No .   Dental Screening: Recommended annual dental exams for proper oral hygiene  Community Resource Referral / Chronic Care  Management: CRR required this visit?  No   CCM required this visit?  No      Plan:     I have personally reviewed and noted the following in the patient's chart:   Medical and social history Use of alcohol, tobacco or illicit drugs  Current medications and supplements including opioid prescriptions. Patient is not currently taking opioid prescriptions. Functional ability and status Nutritional status Physical activity Advanced directives List of other physicians Hospitalizations, surgeries, and ER visits in previous 12 months Vitals Screenings to include  cognitive, depression, and falls Referrals and appointments  In addition, I have reviewed and discussed with patient certain preventive protocols, quality metrics, and best practice recommendations. A written personalized care plan for preventive services as well as general preventive health recommendations were provided to patient.     Kellie Simmering, LPN   [id number]   Nurse Notes: none   Due to this being a virtual visit, the after visit summary with patients personalized plan was offered to patient via mail or my-chart.  Patient would like to access on my-chart

## 2022-06-25 NOTE — Telephone Encounter (Signed)
   Telephone encounter was:  Successful.  06/25/2022 Name: Letonia Stead MRN: [id number] DOB: 1981-02-01  Agustina Witzke is a 41 y.o. year old female who is a primary care patient of Charlott Rakes, MD . The community resource team was consulted for assistance with [place] guide performed the following interventions: Patient provided with information about care guide support team and interviewed to confirm resource needs.Patient stated she needs food resources because her food stamps were not applied to her accout yet. I did email food resources to the patient  Follow Up Plan:  No further follow up planned at this time. The patient has been provided with needed resources.    [facility], Care Management  [phone] [address], [place], [place] [address] Phone: [phone] Email: [email or web address]

## 2022-07-18 ENCOUNTER — Ambulatory Visit: Payer: Medicare HMO | Admitting: Family Medicine

## 2022-08-02 DIAGNOSIS — M329 Systemic lupus erythematosus, unspecified: Secondary | ICD-10-CM | POA: Diagnosis not present

## 2022-08-07 ENCOUNTER — Ambulatory Visit (INDEPENDENT_AMBULATORY_CARE_PROVIDER_SITE_OTHER): Payer: Medicare HMO | Admitting: Licensed Clinical Social Worker

## 2022-08-07 DIAGNOSIS — R69 Illness, unspecified: Secondary | ICD-10-CM | POA: Diagnosis not present

## 2022-08-07 DIAGNOSIS — F250 Schizoaffective disorder, bipolar type: Secondary | ICD-10-CM | POA: Diagnosis not present

## 2022-08-07 DIAGNOSIS — F4312 Post-traumatic stress disorder, chronic: Secondary | ICD-10-CM

## 2022-08-09 NOTE — Progress Notes (Signed)
Virtual Visit via Video Note  I connected with Victoria Holmes on 08/07/22 at 11:00 AM EST by a video enabled telemedicine application and verified that I am speaking with the correct person using two identifiers.  Location: Patient: home Provider: remote office [place], [place])   I discussed the limitations of evaluation and management by telemedicine and the availability of in person appointments. The patient expressed understanding and agreed to proceed.  I discussed the assessment and treatment plan with the patient. The patient was provided an opportunity to ask questions and all were answered. The patient agreed with the plan and demonstrated an understanding of the instructions.   The patient was advised to call back or seek an in-person evaluation if the symptoms worsen or if the condition fails to improve as anticipated.  I provided 60 minutes of non-face-to-face time during this encounter.   [place], LCSW   THERAPIST PROGRESS NOTE  Session Time: 11a-12p  Participation Level: Active  Behavioral Response: NeatAlertAngry, Anxious, and Irritable  Type of Therapy: Individual Therapy  Treatment Goals addressed:  Reduce the negative impact trauma related symptoms have on social, occupational, and family functioning per pt self report 3 out of 5 sessions documented.    Develop healthy thinking patterns and beliefs about self, others, and the world that lead to the alleviation and help prevent the relapse of depression per self report 3 out of 5 sessions documented   ProgressTowards Goals: Progressing  Interventions: CBT, Motivational Interviewing, Supportive, and Anger Management Training  Summary: Victoria Holmes is a 42 y.o. female who presents with symptoms consistent with schizoaffective disorder and chronic PTSD.    Allowed pt to explore and express thoughts and feelings associated with recent life situations and external stressors. Patient reports that she is  still experiencing some nightmares, depression, anxiety, hypervigilance, anger and patient reports that she is continuing to drink alcohol on a regular basis. Patient reports that she drinks one bottle of tequila every three days, and an occasional bottle of wine.  Patient reports that she continues to have stress associated with her current housing situation. Patient reports that she had to move out of her apartment, and is currently living with a family member (sister). Patient states that she should have stable housing in two weeks.  Patient states that she has been thinking about losses, because her baby's birthday was on the 10/14/2022 and the death anniversary was around the same time. Allowed patient safe space to explore her thoughts and feelings associated with loss, and allow patient to identify where she is in the grieving process.  Patient is concerned about ongoing hair loss--encourage patient to reach out and discuss concerns with their primary care physician, in any other specialty physicians that she is currently seeing. Explored patients feelings of being dismissed by doctors and the medical community.  Continued recommendations are as follows: self care behaviors, positive social engagements, focusing on overall work/home/life balance, and focusing on positive physical and emotional wellness.   Suicidal/Homicidal: No  Therapist Response: Pt is continuing to apply interventions learned in session into daily life situations. Pt is currently on track to meet goals utilizing interventions mentioned above. Personal growth and progress noted. Treatment to continue as indicated.   Plan: Return again in 4 weeks.  Diagnosis:  Encounter Diagnoses  Name Primary?   Schizoaffective disorder, bipolar type (HCC) Yes   Chronic post-traumatic stress disorder (PTSD)     Collaboration of Care: Other pt encouraged to continue care with psychiatrist of record,  Dr. Berniece Andreas  Patient/Guardian was  advised Release of Information must be obtained prior to any record release in order to collaborate their care with an outside provider. Patient/Guardian was advised if they have not already done so to contact the registration department to sign all necessary forms in order for [place] to release information regarding their care.   Consent: Patient/Guardian gives verbal consent for treatment and assignment of benefits for services provided during this visit. Patient/Guardian expressed understanding and agreed to proceed.   [place], LCSW 08/09/2022

## 2022-08-13 ENCOUNTER — Other Ambulatory Visit: Payer: Self-pay | Admitting: Family Medicine

## 2022-08-13 ENCOUNTER — Other Ambulatory Visit (HOSPITAL_COMMUNITY): Payer: Self-pay | Admitting: Psychiatry

## 2022-08-13 DIAGNOSIS — F4312 Post-traumatic stress disorder, chronic: Secondary | ICD-10-CM

## 2022-08-13 DIAGNOSIS — F250 Schizoaffective disorder, bipolar type: Secondary | ICD-10-CM

## 2022-08-13 DIAGNOSIS — F1210 Cannabis abuse, uncomplicated: Secondary | ICD-10-CM

## 2022-08-13 DIAGNOSIS — E559 Vitamin D deficiency, unspecified: Secondary | ICD-10-CM

## 2022-08-14 NOTE — Telephone Encounter (Signed)
Requested medications are due for refill today.  no  Requested medications are on the active medications list.  yes  Last refill. 05/09/2022 #12 1 rf  Future visit scheduled.   yes  Notes to clinic.  Refill not delegated.    Requested Prescriptions  Pending Prescriptions Disp Refills   Vitamin D, Ergocalciferol, (DRISDOL) 1.25 MG (50000 UNIT) CAPS capsule [Pharmacy Med Name: ergocalciferol (vitamin D2) 1,250 mcg (50,000 unit) capsule] 12 capsule 1    Sig: Take 1 capsule (50,000 Units total) by mouth once a week.     Endocrinology:  Vitamins - Vitamin D Supplementation 2 Failed - 08/13/2022  1:58 PM      Failed - Manual Review: Route requests for 50,000 IU strength to the provider      Failed - Vitamin D in normal range and within 360 days    Vit D, 25-Hydroxy  Date Value Ref Range Status  05/08/2022 16.5 (L) 30.0 - 100.0 ng/mL Final    Comment:    Vitamin D deficiency has been defined by the [place] practice guideline as a level of serum 25-OH vitamin D less than 20 ng/mL (1,2). The Endocrine Society went on to further define vitamin D insufficiency as a level between 21 and 29 ng/mL (2). 1. IOM (Institute of Medicine). 2010. Dietary reference    intakes for calcium and D. [place]: The    [employer]. 2. Holick MF, [place], Bischoff-Ferrari HA, et al.    Evaluation, treatment, and prevention of vitamin D    deficiency: an Endocrine Society clinical practice    guideline. JCEM. 2011 Jul; 96(7):1911-30.          Passed - Ca in normal range and within 360 days    Calcium  Date Value Ref Range Status  04/19/2022 9.9 8.9 - 10.3 mg/dL Final   Calcium, Ion  Date Value Ref Range Status  03/18/2019 1.21 1.15 - 1.40 mmol/L Final         Passed - Valid encounter within last 12 months    Recent Outpatient Visits           3 months ago Type 2 diabetes mellitus with other specified complication, without long-term  current use of insulin ([place])   [place], [place], MD   7 months ago Type 2 diabetes mellitus with other specified complication, without long-term current use of insulin ([place])   [place], [place], MD   10 months ago Type 2 diabetes mellitus with other specified complication, without long-term current use of insulin ([place])   [place], Charlane Ferretti, MD   1 year ago Rheumatoid arthritis involving multiple sites, unspecified whether rheumatoid factor present [facility])   [place], Charlane Ferretti, MD   1 year ago Other microscopic hematuria   [place], [place], MD       Future Appointments             In 1 month Charlott Rakes, MD [place]   In 2 months Charlott Rakes, MD [place]

## 2022-08-29 ENCOUNTER — Other Ambulatory Visit: Payer: Self-pay | Admitting: Family Medicine

## 2022-08-29 DIAGNOSIS — I10 Essential (primary) hypertension: Secondary | ICD-10-CM

## 2022-09-04 DIAGNOSIS — E119 Type 2 diabetes mellitus without complications: Secondary | ICD-10-CM | POA: Diagnosis not present

## 2022-09-04 DIAGNOSIS — Z79899 Other long term (current) drug therapy: Secondary | ICD-10-CM | POA: Diagnosis not present

## 2022-09-04 DIAGNOSIS — L932 Other local lupus erythematosus: Secondary | ICD-10-CM | POA: Diagnosis not present

## 2022-09-04 DIAGNOSIS — H40053 Ocular hypertension, bilateral: Secondary | ICD-10-CM | POA: Diagnosis not present

## 2022-09-04 DIAGNOSIS — G43909 Migraine, unspecified, not intractable, without status migrainosus: Secondary | ICD-10-CM | POA: Diagnosis not present

## 2022-09-04 LAB — HM DIABETES EYE EXAM

## 2022-09-06 ENCOUNTER — Ambulatory Visit (INDEPENDENT_AMBULATORY_CARE_PROVIDER_SITE_OTHER): Payer: Medicare HMO | Admitting: Licensed Clinical Social Worker

## 2022-09-06 DIAGNOSIS — F250 Schizoaffective disorder, bipolar type: Secondary | ICD-10-CM

## 2022-09-06 DIAGNOSIS — F4312 Post-traumatic stress disorder, chronic: Secondary | ICD-10-CM | POA: Diagnosis not present

## 2022-09-06 DIAGNOSIS — R69 Illness, unspecified: Secondary | ICD-10-CM | POA: Diagnosis not present

## 2022-09-06 NOTE — Progress Notes (Signed)
Virtual Visit via Video Note  I connected with Girtha Hake on 09/06/22  at 11:00 AM EST by a video enabled telemedicine application and verified that I am speaking with the correct person using two identifiers.  Location: Patient: home Provider: [place] Office   I discussed the limitations of evaluation and management by telemedicine and the availability of in person appointments. The patient expressed understanding and agreed to proceed.  I discussed the assessment and treatment plan with the patient. The patient was provided an opportunity to ask questions and all were answered. The patient agreed with the plan and demonstrated an understanding of the instructions.   The patient was advised to call back or seek an in-person evaluation if the symptoms worsen or if the condition fails to improve as anticipated.  I provided 35 minutes of non-face-to-face time during this encounter.   [place], LCSW   THERAPIST PROGRESS NOTE  Session Time: [id number]  Participation Level: Active  Behavioral Response: NeatAlertAngry, Anxious, and Irritable  Type of Therapy: Individual Therapy  Treatment Goals addressed:  Reduce the negative impact trauma related symptoms have on social, occupational, and family functioning per pt self report 3 out of 5 sessions documented.    Develop healthy thinking patterns and beliefs about self, others, and the world that lead to the alleviation and help prevent the relapse of depression per self report 3 out of 5 sessions documented   ProgressTowards Goals: Progressing  Interventions: CBT, Motivational Interviewing, Supportive, and Anger Management Training  Summary: Maclyn Comes is a 42 y.o. female who presents with symptoms consistent with schizoaffective disorder and chronic PTSD.  Patient was visibly distressed throughout session.  Allowed pt to explore and express thoughts and feelings associated with recent  life situations and external stressors.  Patient reports that she has been compliant with her medication.  Patient reports fair quality and quantity of sleep.  Patient feels the Seroquel is triggering vivid dreams.  Patient was experiencing acute distress over her not getting a security deposit back from switching apartments.  Allowed patient space to explore the situation that happened earlier in the day between herself and the apartment complex.  Patient made the statement " I wanted to put my hands on them".  Praised patient's ability to regulate emotions and regulate self-control.  Reviewed overall anger management strategies.  Patient reflects understanding.  Patient reports that she is trying hard to improve her overall blood glucose management, chronic pain, and overall self-care.  Patient states that she feels that her "noni" drink is helping manage health issues.  Patient reports that she did go to the rheumatologist recently and that the physician told her that lupus is in remission.  Patient reports that this helps her feel a little less distressed.  Coached patient through stress management/anxiety management/anger regulation strategies, and assisted in de-escalating patient.  Patient expressed appreciation for her care and support.  Continued recommendations are as follows: self care behaviors, positive social engagements, focusing on overall work/home/life balance, and focusing on positive physical and emotional wellness.   Suicidal/Homicidal: No  Therapist Response: Pt is continuing to apply interventions learned in session into daily life situations. Pt is currently on track to meet goals utilizing interventions mentioned above. Personal growth and progress noted. Treatment to continue as indicated.   Plan: Return again in 4 weeks.  Diagnosis:  Encounter Diagnoses  Name Primary?   Schizoaffective disorder, bipolar type (HCC) Yes   Chronic post-traumatic stress disorder (PTSD)  Collaboration of Care: Other pt encouraged to continue care with psychiatrist of record, Dr. Berniece Andreas  Patient/Guardian was advised Release of Information must be obtained prior to any record release in order to collaborate their care with an outside provider. Patient/Guardian was advised if they have not already done so to contact the registration department to sign all necessary forms in order for [place] to release information regarding their care.   Consent: Patient/Guardian gives verbal consent for treatment and assignment of benefits for services provided during this visit. Patient/Guardian expressed understanding and agreed to proceed.   [place], LCSW 09/06/2022

## 2022-09-10 ENCOUNTER — Other Ambulatory Visit (HOSPITAL_COMMUNITY): Payer: Self-pay | Admitting: Psychiatry

## 2022-09-10 DIAGNOSIS — F4312 Post-traumatic stress disorder, chronic: Secondary | ICD-10-CM

## 2022-09-10 DIAGNOSIS — F250 Schizoaffective disorder, bipolar type: Secondary | ICD-10-CM

## 2022-09-11 ENCOUNTER — Telehealth (HOSPITAL_COMMUNITY): Payer: Medicare HMO | Admitting: Psychiatry

## 2022-09-12 ENCOUNTER — Encounter (HOSPITAL_COMMUNITY): Payer: Self-pay | Admitting: Psychiatry

## 2022-09-12 ENCOUNTER — Telehealth (HOSPITAL_BASED_OUTPATIENT_CLINIC_OR_DEPARTMENT_OTHER): Payer: Medicare HMO | Admitting: Psychiatry

## 2022-09-12 VITALS — Wt 174.0 lb

## 2022-09-12 DIAGNOSIS — F4312 Post-traumatic stress disorder, chronic: Secondary | ICD-10-CM

## 2022-09-12 DIAGNOSIS — F250 Schizoaffective disorder, bipolar type: Secondary | ICD-10-CM | POA: Diagnosis not present

## 2022-09-12 DIAGNOSIS — R69 Illness, unspecified: Secondary | ICD-10-CM | POA: Diagnosis not present

## 2022-09-12 DIAGNOSIS — F1210 Cannabis abuse, uncomplicated: Secondary | ICD-10-CM

## 2022-09-12 MED ORDER — QUETIAPINE FUMARATE 50 MG PO TABS: 50.0000 mg | ORAL_TABLET | Freq: Every day | ORAL | 2 refills | Status: DC

## 2022-09-12 MED ORDER — BUSPIRONE HCL 5 MG PO TABS: 5.0000 mg | ORAL_TABLET | Freq: Two times a day (BID) | ORAL | 2 refills | Status: DC

## 2022-09-12 NOTE — Progress Notes (Signed)
[facility] MD Virtual Progress Note   Patient Location: Home Provider Location: Home Office  I connect with patient by telephone and verified that I am speaking with correct person by using two identifiers. I discussed the limitations of evaluation and management by telemedicine and the availability of in person appointments. I also discussed with the patient that there may be a patient responsible charge related to this service. The patient expressed understanding and agreed to proceed.  Victoria Holmes [id number] 42 y.o.  09/12/2022 2:35 PM    History of Present Illness:  Patient is evaluated by phone session.  She apologized that she is more able to do video session.  She is in the process of moving.  She admitted having issues with housing authority complex.  She is getting legal aid to get her money back.  She is hoping moving will happen smoothly in the next few weeks.  She still have bad dreams but overall sleep is better.  She denies any hallucination, paranoia or any suicidal thoughts.  Her holidays were quite and she was alone.  She admitted to try to cut down her cannabis use but is still smoke on and off.  She is in therapy with Christina.  She denies any mania, psychosis, hallucination.  She she admitted few pounds weight gain because of the stress eating but hoping to get better on her diet and weight loss.  Her plan is to start walking when weather is better.  She is getting Cymbalta from her PCP.  She is taking Seroquel every night and BuSpar 2 times a day that is helping her anxiety and paranoia.  She is also sleeping better.  She has no tremor or shakes or any EPS.  Past Psychiatric History: H/O PTSD, schizoaffective disorder, and bipolar disorder.  H/O cutting wrist and neck in 2007 and multiple inpatient.  Seroquel helped but higher doses made groggy. Tried Depakote but no details. Tried Ativan and Xanax.  Zoloft, Klonopin had side effects.  As per chart she also  had tried Prozac, Concerta, Lexapro.  Has seen [facility] services of [place] and [place].   Outpatient Encounter Medications as of 09/12/2022  Medication Sig   Accu-Chek Softclix Lancets lancets Use to check blood sugar three times daily. E11.65   amLODipine (NORVASC) 5 MG tablet Take 1 tablet (5 mg total) by mouth daily.   atorvastatin (LIPITOR) 20 MG tablet Take 1 tablet (20 mg total) by mouth daily.   bismuth subsalicylate (PEPTO-BISMOL) 262 MG/15ML suspension 30 mls PO Q 6hrs x 10 days (Patient not taking: Reported on 11/07/2021)   bismuth-metronidazole-tetracycline (PYLERA) 140-125-125 MG capsule Take 3 capsules by mouth 4 (four) times daily -  before meals and at bedtime. (Patient not taking: Reported on 06/25/2022)   Blood Glucose Monitoring Suppl (ACCU-CHEK GUIDE ME) w/Device KIT Use to check blood sugar three times daily. E11.65   busPIRone (BUSPAR) 5 MG tablet Take 1 tablet (5 mg total) by mouth 2 (two) times daily.   DULoxetine (CYMBALTA) 60 MG capsule Take 1 capsule (60 mg total) by mouth daily.   EPINEPHrine (EPIPEN JR) 0.15 MG/0.3ML injection Inject 0.15 mg into the muscle as needed. (Patient not taking: Reported on 06/25/2022)   glucose blood (ACCU-CHEK GUIDE) test strip Use to check blood sugar three times daily. E11.65   hydroxychloroquine (PLAQUENIL) 200 MG tablet Take 200 mg by mouth 2 (two) times daily.   ibuprofen (ADVIL,MOTRIN) 200 MG tablet Take 200 mg by mouth every 6 (six) hours as  needed for fever, headache, mild pain, moderate pain or cramping.   lidocaine (LIDODERM) 5 % Place 1 patch onto the skin daily. Remove & Discard patch within 12 hours or as directed by MD (Patient not taking: Reported on 06/25/2022)   metFORMIN (GLUCOPHAGE) 500 MG tablet Take 1 tablet (500 mg total) by mouth daily with breakfast.   metroNIDAZOLE (FLAGYL) 250 MG tablet Take 1 tablet (250 mg total) by mouth 4 (four) times daily. (Patient not taking: Reported on 11/07/2021)   omeprazole (PRILOSEC)  20 MG capsule Take 1 capsule (20 mg total) by mouth 2 (two) times daily before a meal. (Patient not taking: Reported on 06/25/2022)   pregabalin (LYRICA) 100 MG capsule Take 1 capsule (100 mg total) by mouth 2 (two) times daily.   propranolol (INDERAL) 20 MG tablet TAKE TWO TABLETS BY MOUTH TWICE DAILY   QUEtiapine (SEROQUEL) 50 MG tablet Take 1 tablet (50 mg total) by mouth at bedtime.   Semaglutide,0.25 or 0.5MG/DOS, (OZEMPIC, 0.25 OR 0.5 MG/DOSE,) 2 MG/1.5ML SOPN Inject 0.25 mg into the skin once a week.   tetracycline (SUMYCIN) 500 MG capsule Take 1 capsule (500 mg total) by mouth 4 (four) times daily. (Patient not taking: Reported on 06/25/2022)   tiZANidine (ZANAFLEX) 4 MG tablet Take 1 tablet (4 mg total) by mouth every 8 (eight) hours as needed for muscle spasms.   valACYclovir (VALTREX) 1000 MG tablet Take 1,000 mg by mouth daily.   Vitamin D, Ergocalciferol, (DRISDOL) 1.25 MG (50000 UNIT) CAPS capsule Take 1 capsule (50,000 Units total) by mouth once a week.   No facility-administered encounter medications on file as of 09/12/2022.    Recent Results (from the past 2160 hour(s))  HM DIABETES EYE EXAM     Status: None   Collection Time: 09/04/22 12:00 AM  Result Value Ref Range   HM Diabetic Eye Exam No Retinopathy No Retinopathy     Psychiatric Specialty Exam: Physical Exam  Review of Systems  Weight 174 lb (78.9 kg).There is no height or weight on file to calculate BMI.  General Appearance: NA  Eye Contact:  NA  Speech:  Slow  Volume:  Normal  Mood:  Anxious  Affect:  NA  Thought Process:  Goal Directed  Orientation:  Full (Time, Place, and Person)  Thought Content:  WDL  Suicidal Thoughts:  No  Homicidal Thoughts:  No  Memory:  Immediate;   Good Recent;   Good Remote;   Good  Judgement:  Good  Insight:  Present  Psychomotor Activity:  NA  Concentration:  Concentration: Good and Attention Span: Good  Recall:  Good  Fund of Knowledge:  Good  Language:  Good   Akathisia:  No  Handed:  Right  AIMS (if indicated):     Assets:  Communication Skills Desire for Improvement Housing Transportation  ADL's:  Intact  Cognition:  WNL  Sleep: Better     Assessment/Plan: Schizoaffective disorder, bipolar type (HCC) - Plan: QUEtiapine (SEROQUEL) 50 MG tablet, busPIRone (BUSPAR) 5 MG tablet  Chronic post-traumatic stress disorder (PTSD) - Plan: QUEtiapine (SEROQUEL) 50 MG tablet, busPIRone (BUSPAR) 5 MG tablet  Mild tetrahydrocannabinol (THC) abuse - Plan: busPIRone (BUSPAR) 5 MG tablet  Discussed current medication.  Patient so far stable and taking the medication on a regular basis.  Increase compliance helped her symptoms.  She is getting Cymbalta from PCP.  Continue BuSpar 5 mg 2 times a day and Seroquel 50 mg at bedtime.  Encourage watching her calorie intake and regular  exercise.  Encouraged to continue therapy with Christina.  Recommended to call us back if she has any question or any concern.  Follow-up in 3 months.   Follow Up Instructions:     I discussed the assessment and treatment plan with the patient. The patient was provided an opportunity to ask questions and all were answered. The patient agreed with the plan and demonstrated an understanding of the instructions.   The patient was advised to call back or seek an in-person evaluation if the symptoms worsen or if the condition fails to improve as anticipated.    Collaboration of Care: Other provider involved in patient's care AEB notes are available in epic to review.  Patient/Guardian was advised Release of Information must be obtained prior to any record release in order to collaborate their care with an outside provider. Patient/Guardian was advised if they have not already done so to contact the registration department to sign all necessary forms in order for [place] to release information regarding their care.   Consent: Patient/Guardian gives verbal consent for treatment and  assignment of benefits for services provided during this visit. Patient/Guardian expressed understanding and agreed to proceed.     I provided 19 minutes of non face to face time during this encounter.  Kathlee [place], MD 09/12/2022

## 2022-09-18 ENCOUNTER — Telehealth (HOSPITAL_COMMUNITY): Payer: Medicare HMO | Admitting: Psychiatry

## 2022-09-28 ENCOUNTER — Other Ambulatory Visit: Payer: Self-pay | Admitting: Family Medicine

## 2022-10-02 ENCOUNTER — Encounter (HOSPITAL_COMMUNITY): Payer: Self-pay

## 2022-10-02 ENCOUNTER — Ambulatory Visit (INDEPENDENT_AMBULATORY_CARE_PROVIDER_SITE_OTHER): Payer: Self-pay | Admitting: Licensed Clinical Social Worker

## 2022-10-02 DIAGNOSIS — Z91199 Patient's noncompliance with other medical treatment and regimen due to unspecified reason: Secondary | ICD-10-CM

## 2022-10-02 NOTE — Progress Notes (Signed)
LCSW counselor attempted to connect with patient for scheduled appointment via MyChart video text request x 2 and email request with no response;   Attempt 1: Text and email: 3:05  Attempt 2: Text and email: 3:10p  Attempt 3: phone call: 3:15[  Per [place], after multiple attempts to reach pt unsuccessfully at appointed time--visit will be coded as no show

## 2022-10-03 ENCOUNTER — Ambulatory Visit (INDEPENDENT_AMBULATORY_CARE_PROVIDER_SITE_OTHER): Payer: Self-pay | Admitting: Licensed Clinical Social Worker

## 2022-10-03 DIAGNOSIS — Z91199 Patient's noncompliance with other medical treatment and regimen due to unspecified reason: Secondary | ICD-10-CM

## 2022-10-03 NOTE — Progress Notes (Signed)
LCSW counselor attempted to connect with patient for scheduled appointment via [place] video text request x  and email request with no response  Attempt 1: Text and email: 10:07a  Attempt 2: Text and email: 10:11a  Attempt 3: Text and email: 10:17a  Video session was closed at 10:20am.:   Per [place], after multiple attempts to reach pt unsuccessfully at appointed time--visit will be coded as no show

## 2022-10-08 ENCOUNTER — Ambulatory Visit: Payer: Medicare HMO | Attending: Family Medicine | Admitting: Family Medicine

## 2022-10-08 ENCOUNTER — Encounter: Payer: Self-pay | Admitting: Family Medicine

## 2022-10-08 VITALS — BP 109/74 | HR 63 | Ht 62.0 in | Wt 178.0 lb

## 2022-10-08 DIAGNOSIS — M542 Cervicalgia: Secondary | ICD-10-CM | POA: Diagnosis not present

## 2022-10-08 DIAGNOSIS — M069 Rheumatoid arthritis, unspecified: Secondary | ICD-10-CM | POA: Diagnosis not present

## 2022-10-08 DIAGNOSIS — M797 Fibromyalgia: Secondary | ICD-10-CM

## 2022-10-08 DIAGNOSIS — B351 Tinea unguium: Secondary | ICD-10-CM | POA: Diagnosis not present

## 2022-10-08 DIAGNOSIS — E1169 Type 2 diabetes mellitus with other specified complication: Secondary | ICD-10-CM

## 2022-10-08 LAB — POCT GLYCOSYLATED HEMOGLOBIN (HGB A1C): HbA1c, POC (controlled diabetic range): 6 % (ref 0.0–7.0)

## 2022-10-08 MED ORDER — METFORMIN HCL 500 MG PO TABS: 500.0000 mg | ORAL_TABLET | Freq: Every day | ORAL | 1 refills | Status: DC

## 2022-10-08 MED ORDER — TIZANIDINE HCL 4 MG PO TABS: 4.0000 mg | ORAL_TABLET | Freq: Three times a day (TID) | ORAL | 1 refills | Status: DC | PRN

## 2022-10-08 MED ORDER — PANTOPRAZOLE SODIUM 40 MG PO TBEC: 40.0000 mg | DELAYED_RELEASE_TABLET | Freq: Every day | ORAL | 3 refills | Status: DC

## 2022-10-08 MED ORDER — EPINEPHRINE 0.3 MG/0.3ML IJ SOAJ: 0.3000 mg | INTRAMUSCULAR | 2 refills | Status: DC | PRN

## 2022-10-08 MED ORDER — ATORVASTATIN CALCIUM 20 MG PO TABS: 20.0000 mg | ORAL_TABLET | Freq: Every day | ORAL | 1 refills | Status: DC

## 2022-10-08 MED ORDER — DULOXETINE HCL 60 MG PO CPEP: 60.0000 mg | ORAL_CAPSULE | Freq: Every day | ORAL | 1 refills | Status: DC

## 2022-10-08 MED ORDER — PROPRANOLOL HCL 20 MG PO TABS: 40.0000 mg | ORAL_TABLET | Freq: Two times a day (BID) | ORAL | 0 refills | Status: DC

## 2022-10-08 NOTE — Progress Notes (Unsigned)
Neck pain Nail fungus Medication refills.

## 2022-10-08 NOTE — Progress Notes (Unsigned)
Subjective:  Patient ID: Victoria Holmes, female    DOB: 1980/10/15  Age: 42 y.o. MRN: [id number]  CC: Diabetes   HPI Victoria Holmes is a 42 y.o. year old female with a history of type 2 diabetes mellitus (A1c 5.7), rheumatoid arthritis, lupus, Raynaud's syndrome, Schizoaffective disorder, PTSD, on Valtrex for Herpes Simplex prophylaxis, pseudotumor cerebri, H. pylori gastritis, chronic nausea and vomiting.   Interval History:  She has a couple of concerns today. Nail fungus lesions noticed on her big toenails which she says occurred recently and she would like to address as soon as possible before they get worse.  The callus alternate will be dark and the nails have started to get thick.  Neck pain has been chronic with associated stiffness more on lateral rotation with no previous history of trauma.  No radiculopathic symptoms no weakness in upper extremities She had a visit to Rheumatology at [facility] 2 months ago and labs were sent off with plans to also review her previous records.   Diabetes is stable with A1c of 6.0; not taking Ozempic due to GI adverse effects.  She remains on a statin Currently under behavioral health care for her schizoaffective disorder. Past Medical History:  Diagnosis Date   Anemia    no current med.   Anxiety    Chronic tonsillitis 03/2012   Depression    Diabetes mellitus    diet-controlled   Fibrocystic breast disease    Headache(784.0)    migraines   History of endometriosis    Hyperlipidemia    Hypertension    under control, has been on med. x 1 yr.   Lupus (HCC)    Lupus (HCC)    Neuritis    Panic attacks    Raynauds disease    Rheumatoid arthritis(714.0)    Schizoaffective disorder (HCC)    Seizures (HCC)    last seizure > 1 yr. ago   Sinusitis    Sleep disturbance    Thyroid nodule     Past Surgical History:  Procedure Laterality Date   ABDOMINAL HYSTERECTOMY  6 yrs. ago   complete   CHOLECYSTECTOMY  6-8 yrs. ago   FOOT  SURGERY  05/2011   right great toe   TONSILLECTOMY  04/22/2012   Procedure: TONSILLECTOMY;  Surgeon: Rozetta Nunnery, MD;  Location: [facility];  Service: ENT;  Laterality: N/A;    Family History  Problem Relation Age of Onset   Colon cancer Father    Heart attack Maternal Grandmother        late 40's   Arthritis Maternal Grandfather    Cancer Maternal Grandfather        lung   Diabetes Paternal Grandmother    Hypertension Paternal Grandmother    Kidney disease Paternal Grandmother    Lupus Other    Pancreatic cancer Neg Hx    Stomach cancer Neg Hx    Liver cancer Neg Hx    Esophageal cancer Neg Hx     Social History   Socioeconomic History   Marital status: Single    Spouse name: Not on file   Number of children: 1   Years of education: Not on file   Highest education level: Not on file  Occupational History   Occupation: [employer]  Tobacco Use   Smoking status: Former    Packs/day: 0.25    Years: 3.00    Additional pack years: 0.00    Total pack years: 0.75    Types: Cigarettes  Quit date: 01/2019    Years since quitting: 3.7   Smokeless tobacco: Never   Tobacco comments:    3 cig./day  Vaping Use   Vaping Use: Never used  Substance and Sexual Activity   Alcohol use: Yes    Alcohol/week: 0.0 standard drinks of alcohol    Comment: occasionally   Drug use: Yes    Types: Marijuana   Sexual activity: Not on file  Other Topics Concern   Not on file  Social History Narrative   Not on file   Social Determinants of Health   Financial Resource Strain: Low Risk  (06/25/2022)   Overall Financial Resource Strain (CARDIA)    Difficulty of Paying Living Expenses: Not very hard  Food Insecurity: No Food Insecurity (06/25/2022)   Hunger Vital Sign    Worried About Running Out of Food in the Last Year: Never true    Ran Out of Food in the Last Year: Never true  Recent Concern: Food Insecurity - Food Insecurity Present (06/25/2022)   Hunger Vital  Sign    Worried About Running Out of Food in the Last Year: Sometimes true    Ran Out of Food in the Last Year: Sometimes true  Transportation Needs: No Transportation Needs (06/25/2022)   PRAPARE - Hydrologist (Medical): No    Lack of Transportation (Non-Medical): No  Physical Activity: Inactive (06/25/2022)   Exercise Vital Sign    Days of Exercise per Week: 0 days    Minutes of Exercise per Session: 0 min  Stress: Stress Concern Present (06/25/2022)   [place]    Feeling of Stress : To some extent  Social Connections: Not on file    Allergies  Allergen Reactions   Amoxicillin Hives and Rash    Has patient had a PCN reaction causing immediate rash, facial/tongue/throat swelling, SOB or lightheadedness with hypotension: Yes Has patient had a PCN reaction causing severe rash involving mucus membranes or skin necrosis: Yes Has patient had a PCN reaction that required hospitalization: No Has patient had a PCN reaction occurring within the last 10 years: No If all of the above answers are "NO", then may proceed with Cephalosporin use.    Azithromycin Shortness Of Breath and Rash   Darvocet [Propoxyphene N-Acetaminophen] Shortness Of Breath and Rash   Grapeseed Extract [Nutritional Supplements] Shortness Of Breath, Swelling and Rash    GRAPES   Kiwi Extract Shortness Of Breath, Swelling and Rash   Latex Shortness Of Breath   Macrolides And Ketolides Shortness Of Breath and Rash   Morphine And Related Shortness Of Breath, Rash and Other (See Comments)    MUSCLE RIGIDITY   Sulfa Antibiotics Shortness Of Breath    MUSCLE RIGIDITY   Klonopin [Clonazepam] Rash and Other (See Comments)    HOMICIDAL THOUGHTS   Penicillins Itching and Swelling    Swelling of mouth Has patient had a PCN reaction causing immediate rash, facial/tongue/throat swelling, SOB or lightheadedness with hypotension:  Yes Has patient had a PCN reaction causing severe rash involving mucus membranes or skin necrosis: No Has patient had a PCN reaction that required hospitalization: No Has patient had a PCN reaction occurring within the last 10 years: No If all of the above answers are "NO", then may proceed with Cephalosporin use.    Zoloft [Sertraline Hcl] Other (See Comments)    TARDIVE DYSKINESIA   Contrast Media [Iodinated Contrast Media] Swelling    Pt  CAN NOT have drinking contrast; She CAN have IV contrast; when pt drinks water soluble contrast, she develops shallow breathing and hives along with Herpes Type 2 around her eyes   Flagyl [Metronidazole] Nausea And Vomiting   Adhesive [Tape] Rash   Concerta [Methylphenidate] Nausea Only   Lexapro [Escitalopram Oxalate] Diarrhea and Rash   Lortab [Hydrocodone-Acetaminophen] Rash    Outpatient Medications Prior to Visit  Medication Sig Dispense Refill   Accu-Chek Softclix Lancets lancets Use to check blood sugar three times daily. E11.65 100 each 3   amLODipine (NORVASC) 5 MG tablet Take 1 tablet (5 mg total) by mouth daily. 90 tablet 1   Blood Glucose Monitoring Suppl (ACCU-CHEK GUIDE ME) w/Device KIT Use to check blood sugar three times daily. E11.65 1 kit 0   busPIRone (BUSPAR) 5 MG tablet Take 1 tablet (5 mg total) by mouth 2 (two) times daily. 60 tablet 2   glucose blood (ACCU-CHEK GUIDE) test strip Use to check blood sugar three times daily. E11.65 100 each 3   hydroxychloroquine (PLAQUENIL) 200 MG tablet Take 200 mg by mouth 2 (two) times daily.     ibuprofen (ADVIL,MOTRIN) 200 MG tablet Take 200 mg by mouth every 6 (six) hours as needed for fever, headache, mild pain, moderate pain or cramping.     QUEtiapine (SEROQUEL) 50 MG tablet Take 1 tablet (50 mg total) by mouth at bedtime. 30 tablet 2   valACYclovir (VALTREX) 1000 MG tablet Take 1,000 mg by mouth daily.     Vitamin D, Ergocalciferol, (DRISDOL) 1.25 MG (50000 UNIT) CAPS capsule Take 1  capsule (50,000 Units total) by mouth once a week. 12 capsule 1   atorvastatin (LIPITOR) 20 MG tablet Take 1 tablet (20 mg total) by mouth daily. 90 tablet 1   DULoxetine (CYMBALTA) 60 MG capsule Take 1 capsule (60 mg total) by mouth daily. 90 capsule 1   EPINEPHrine (EPIPEN JR) 0.15 MG/0.3ML injection Inject 0.15 mg into the muscle as needed.     metFORMIN (GLUCOPHAGE) 500 MG tablet Take 1 tablet (500 mg total) by mouth daily with breakfast. 180 tablet 1   propranolol (INDERAL) 20 MG tablet TAKE TWO TABLETS BY MOUTH TWICE DAILY 120 tablet 0   tiZANidine (ZANAFLEX) 4 MG tablet Take 1 tablet (4 mg total) by mouth every 8 (eight) hours as needed for muscle spasms. 270 tablet 1   pregabalin (LYRICA) 100 MG capsule Take 1 capsule (100 mg total) by mouth 2 (two) times daily. (Patient not taking: Reported on 10/08/2022) 60 capsule 3   Semaglutide,0.25 or 0.5MG /DOS, (OZEMPIC, 0.25 OR 0.5 MG/DOSE,) 2 MG/1.5ML SOPN Inject 0.25 mg into the skin once a week. (Patient not taking: Reported on 10/08/2022) 2 mL 6   No facility-administered medications prior to visit.     ROS Review of Systems  Constitutional:  Negative for activity change and appetite change.  HENT:  Negative for sinus pressure and sore throat.   Respiratory:  Negative for chest tightness, shortness of breath and wheezing.   Cardiovascular:  Negative for chest pain and palpitations.  Gastrointestinal:  Negative for abdominal distention, abdominal pain and constipation.  Genitourinary: Negative.   Musculoskeletal:  Positive for neck pain.  Psychiatric/Behavioral:  Negative for behavioral problems and dysphoric mood.     Objective:  BP 109/74   Pulse 63   Ht 5\' 2"  (1.575 m)   Wt 178 lb (80.7 kg)   SpO2 99%   BMI 32.56 kg/m      10/08/2022  3:32 PM 09/12/2022    3:05 PM 06/25/2022    9:11 AM  BP/Weight  Systolic BP 0000000    Diastolic BP 74    Wt. (Lbs) 178  160  BMI 32.56 kg/m2  29.26 kg/m2     Information is confidential and  restricted. Go to Review Flowsheets to unlock data.      Physical Exam Constitutional:      Appearance: She is well-developed.  Neck:     Comments: Slight stiffness on lateral rotation of neck Cardiovascular:     Rate and Rhythm: Normal rate.     Heart sounds: Normal heart sounds. No murmur heard. Pulmonary:     Effort: Pulmonary effort is normal.     Breath sounds: Normal breath sounds. No wheezing or rales.  Chest:     Chest wall: No tenderness.  Abdominal:     General: Bowel sounds are normal. There is no distension.     Palpations: Abdomen is soft. There is no mass.     Tenderness: There is no abdominal tenderness.  Musculoskeletal:        General: Normal range of motion.     Cervical back: No tenderness.     Right lower leg: No edema.     Left lower leg: No edema.  Neurological:     Mental Status: She is alert and oriented to person, place, and time.  Psychiatric:        Mood and Affect: Mood normal.        Latest Ref Rng & Units 04/19/2022   11:59 AM 04/02/2022    9:10 PM 10/25/2021    9:00 AM  CMP  Glucose 70 - 99 mg/dL 111  114  88   BUN 6 - 20 mg/dL 5  7  10    Creatinine 0.44 - 1.00 mg/dL 0.74  0.87  0.75   Sodium 135 - 145 mmol/L 145  140  144   Potassium 3.5 - 5.1 mmol/L 4.4  3.4  4.4   Chloride 98 - 111 mmol/L 110  107  106   CO2 22 - 32 mmol/L 26  21  24    Calcium 8.9 - 10.3 mg/dL 9.9  10.0  9.9   Total Protein 6.5 - 8.1 g/dL 7.9   7.9   Total Bilirubin 0.3 - 1.2 mg/dL 0.5   0.5   Alkaline Phos 38 - 126 U/L 65   96   AST 15 - 41 U/L 22   21   ALT 0 - 44 U/L 31   32     Lipid Panel     Component Value Date/Time   CHOL 99 (L) 10/25/2021 0900   TRIG 65 10/25/2021 0900   HDL 40 10/25/2021 0900   CHOLHDL 4.3 11/10/2020 0928   LDLCALC 45 10/25/2021 0900    CBC    Component Value Date/Time   WBC 6.0 04/19/2022 1159   RBC 4.93 04/19/2022 1159   HGB 13.2 04/19/2022 1159   HCT 41.0 04/19/2022 1159   PLT 231 04/19/2022 1159   MCV 83.2 04/19/2022  1159   MCH 26.8 04/19/2022 1159   MCHC 32.2 04/19/2022 1159   RDW 15.1 04/19/2022 1159   LYMPHSABS 1.9 04/19/2022 1159   MONOABS 0.4 04/19/2022 1159   EOSABS 0.1 04/19/2022 1159   BASOSABS 0.0 04/19/2022 1159    Lab Results  Component Value Date   HGBA1C 6.0 10/08/2022    Assessment & Plan:  1. Type 2 diabetes mellitus with other specified complication,  without long-term current use of insulin (HCC) Controlled with A1c of 6.0 Unable to tolerate Ozempic due to GI adverse effects Continue metformin Counseled on Diabetic diet, my plate method, [id number] minutes of moderate intensity exercise/week Blood sugar logs with fasting goals of 80-120 mg/dl, random of less than 180 and in the event of sugars less than 60 mg/dl or greater than 400 mg/dl encouraged to notify the clinic. Advised on the need for annual eye exams, annual foot exams, Pneumonia vaccine. - POCT glycosylated hemoglobin (Hb A1C) - atorvastatin (LIPITOR) 20 MG tablet; Take 1 tablet (20 mg total) by mouth daily.  Dispense: 90 tablet; Refill: 1 - metFORMIN (GLUCOPHAGE) 500 MG tablet; Take 1 tablet (500 mg total) by mouth daily with breakfast.  Dispense: 180 tablet; Refill: 1 - CMP14+EGFR  2. Fibromyalgia Stable - DULoxetine (CYMBALTA) 60 MG capsule; Take 1 capsule (60 mg total) by mouth daily.  Dispense: 90 capsule; Refill: 1 - tiZANidine (ZANAFLEX) 4 MG tablet; Take 1 tablet (4 mg total) by mouth every 8 (eight) hours as needed for muscle spasms.  Dispense: 270 tablet; Refill: 1  3. Rheumatoid arthritis involving multiple sites, unspecified whether rheumatoid factor present (HCC) Stable - DULoxetine (CYMBALTA) 60 MG capsule; Take 1 capsule (60 mg total) by mouth daily.  Dispense: 90 capsule; Refill: 1  4. Onychomycosis She has nail polish on her toenails hence I am unable to identify this finding on exam  5. Neck pain Will need to exclude spondylosis versus osteoarthritis She is currently on Cymbalta and tizanidine Apply  heat Previously referred to PT but transportation was a challenge - [facility] Cervical Spine Complete; Future    Meds ordered this encounter  Medications   atorvastatin (LIPITOR) 20 MG tablet    Sig: Take 1 tablet (20 mg total) by mouth daily.    Dispense:  90 tablet    Refill:  1   DULoxetine (CYMBALTA) 60 MG capsule    Sig: Take 1 capsule (60 mg total) by mouth daily.    Dispense:  90 capsule    Refill:  1    This prescription was filled on 05/31/2022. Any refills authorized will be placed on file.   metFORMIN (GLUCOPHAGE) 500 MG tablet    Sig: Take 1 tablet (500 mg total) by mouth daily with breakfast.    Dispense:  180 tablet    Refill:  1   propranolol (INDERAL) 20 MG tablet    Sig: Take 2 tablets (40 mg total) by mouth 2 (two) times daily.    Dispense:  120 tablet    Refill:  0    This prescription was filled on 09/28/2022. Any refills authorized will be placed on file.   tiZANidine (ZANAFLEX) 4 MG tablet    Sig: Take 1 tablet (4 mg total) by mouth every 8 (eight) hours as needed for muscle spasms.    Dispense:  270 tablet    Refill:  1    This prescription was filled on 04/18/2022. Any refills authorized will be placed on file.   EPINEPHrine 0.3 mg/0.3 mL IJ SOAJ injection    Sig: Inject 0.3 mg into the muscle as needed for anaphylaxis.    Dispense:  1 each    Refill:  2   pantoprazole (PROTONIX) 40 MG tablet    Sig: Take 1 tablet (40 mg total) by mouth daily.    Dispense:  30 tablet    Refill:  3    Follow-up: Return in about 6 months (around 04/10/2023) for Chronic  medical conditions.       Charlott Rakes, MD, FAAFP. [facility] [place], [place]   10/08/2022, 4:06 PM

## 2022-10-08 NOTE — Patient Instructions (Signed)

## 2022-10-09 LAB — CMP14+EGFR
ALT: 29 IU/L (ref 0–32)
AST: 26 IU/L (ref 0–40)
Albumin/Globulin Ratio: 1.5 (ref 1.2–2.2)
Albumin: 4.6 g/dL (ref 3.9–4.9)
Alkaline Phosphatase: 88 IU/L (ref 44–121)
BUN/Creatinine Ratio: 14 (ref 9–23)
BUN: 11 mg/dL (ref 6–24)
Bilirubin Total: 0.4 mg/dL (ref 0.0–1.2)
CO2: 26 mmol/L (ref 20–29)
Calcium: 10 mg/dL (ref 8.7–10.2)
Chloride: 105 mmol/L (ref 96–106)
Creatinine, Ser: 0.76 mg/dL (ref 0.57–1.00)
Globulin, Total: 3.1 g/dL (ref 1.5–4.5)
Glucose: 84 mg/dL (ref 70–99)
Potassium: 5 mmol/L (ref 3.5–5.2)
Sodium: 146 mmol/L — ABNORMAL HIGH (ref 134–144)
Total Protein: 7.7 g/dL (ref 6.0–8.5)
eGFR: 101 mL/min/{1.73_m2} (ref >59)

## 2022-10-16 ENCOUNTER — Other Ambulatory Visit (HOSPITAL_COMMUNITY): Payer: Self-pay | Admitting: Psychiatry

## 2022-10-16 DIAGNOSIS — F1210 Cannabis abuse, uncomplicated: Secondary | ICD-10-CM

## 2022-10-16 DIAGNOSIS — F4312 Post-traumatic stress disorder, chronic: Secondary | ICD-10-CM

## 2022-10-16 DIAGNOSIS — F250 Schizoaffective disorder, bipolar type: Secondary | ICD-10-CM

## 2022-11-07 ENCOUNTER — Ambulatory Visit: Payer: Medicare HMO | Admitting: Family Medicine

## 2022-12-04 ENCOUNTER — Ambulatory Visit (INDEPENDENT_AMBULATORY_CARE_PROVIDER_SITE_OTHER): Payer: Medicare HMO | Admitting: Licensed Clinical Social Worker

## 2022-12-04 ENCOUNTER — Other Ambulatory Visit: Payer: Self-pay | Admitting: Family Medicine

## 2022-12-04 ENCOUNTER — Other Ambulatory Visit (HOSPITAL_COMMUNITY): Payer: Self-pay | Admitting: Psychiatry

## 2022-12-04 DIAGNOSIS — F4312 Post-traumatic stress disorder, chronic: Secondary | ICD-10-CM

## 2022-12-04 DIAGNOSIS — F1210 Cannabis abuse, uncomplicated: Secondary | ICD-10-CM

## 2022-12-04 DIAGNOSIS — F250 Schizoaffective disorder, bipolar type: Secondary | ICD-10-CM
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
## 2022-12-11 ENCOUNTER — Encounter (HOSPITAL_COMMUNITY): Payer: Self-pay | Admitting: Psychiatry

## 2022-12-11 ENCOUNTER — Telehealth (HOSPITAL_BASED_OUTPATIENT_CLINIC_OR_DEPARTMENT_OTHER): Payer: Medicare HMO | Admitting: Psychiatry

## 2022-12-11 VITALS — Wt 178.0 lb

## 2022-12-11 DIAGNOSIS — F4312 Post-traumatic stress disorder, chronic: Secondary | ICD-10-CM

## 2022-12-11 DIAGNOSIS — F1210 Cannabis abuse, uncomplicated: Secondary | ICD-10-CM

## 2022-12-11 DIAGNOSIS — F250 Schizoaffective disorder, bipolar type: Secondary | ICD-10-CM | POA: Diagnosis not present

## 2022-12-11 MED ORDER — QUETIAPINE FUMARATE 100 MG PO TABS: 100.0000 mg | ORAL_TABLET | Freq: Every day | ORAL | 2 refills | Status: DC

## 2022-12-11 MED ORDER — BUSPIRONE HCL 5 MG PO TABS: 5.0000 mg | ORAL_TABLET | Freq: Two times a day (BID) | ORAL | 2 refills | Status: AC

## 2022-12-11 NOTE — Progress Notes (Signed)
[facility] MD Virtual Progress Note   Patient Location: Home Provider Location: Home Office  I connect with patient by video and verified that I am speaking with correct person by using two identifiers. I discussed the limitations of evaluation and management by telemedicine and the availability of in person appointments. I also discussed with the patient that there may be a patient responsible charge related to this service. The patient expressed understanding and agreed to proceed.  Victoria Holmes [id number] 42 y.o.  12/11/2022 2:44 PM  History of Present Illness:  Patient is evaluated by video session.  She is taking all medications.  She reported still struggle with irritability, frustration, paranoia and sometime hallucination.  She denies any command hallucinations but sometimes she feels having conversation with someone.  She struggled with sleep and having nightmares.  Patient told recently she had to visit home to attend the funeral of her uncle and aunt and that trigger her symptoms.  She had confrontation and argument with the cousin.  She was upset with her older sister because she believed brother-in-law cheating with the sister.  She admitted to try to stay with herself and does not like to be around people.  She get easily upset.  She is having nightmares and flashback.  She has limited social network.  She thinks about her family issues a lot.  She admitted if she stays with people she get angry Irritable and on the edge.  She also reported back pain and flareup.  She is getting Cymbalta from primary care to help the chronic pain.  She is taking Seroquel 50 mg and BuSpar 5 mg twice a day.  She feels it helps but does not keep her calm all the time.  She is walking now on a regular basis.  She is also in therapy with Christina.  She denies any suicidal thoughts or homicidal thoughts.  She continues to smoke marijuana and admitted it helps her anxiety and not sure if she will  ever quit.  Past Psychiatric History: H/O PTSD, schizoaffective disorder, and bipolar disorder.  H/O cutting wrist and neck in 2007 and multiple inpatient.  Seroquel helped but higher doses made groggy. Tried Depakote but no details. Tried Ativan and Xanax.  Zoloft, Klonopin had side effects.  As per chart she also had tried Prozac, Concerta, Lexapro.  Has seen [facility] [place] and [facility].    Outpatient Encounter Medications as of 12/11/2022  Medication Sig   Accu-Chek Softclix Lancets lancets Use to check blood sugar three times daily. E11.65   amLODipine (NORVASC) 5 MG tablet Take 1 tablet (5 mg total) by mouth daily.   atorvastatin (LIPITOR) 20 MG tablet Take 1 tablet (20 mg total) by mouth daily.   Blood Glucose Monitoring Suppl (ACCU-CHEK GUIDE ME) w/Device KIT Use to check blood sugar three times daily. E11.65   busPIRone (BUSPAR) 5 MG tablet Take 1 tablet (5 mg total) by mouth 2 (two) times daily.   DULoxetine (CYMBALTA) 60 MG capsule Take 1 capsule (60 mg total) by mouth daily.   EPINEPHrine 0.3 mg/0.3 mL IJ SOAJ injection Inject 0.3 mg into the muscle as needed for anaphylaxis.   glucose blood (ACCU-CHEK GUIDE) test strip Use to check blood sugar three times daily. E11.65   hydroxychloroquine (PLAQUENIL) 200 MG tablet Take 200 mg by mouth 2 (two) times daily.   ibuprofen (ADVIL,MOTRIN) 200 MG tablet Take 200 mg by mouth every 6 (six) hours as needed for fever, headache, mild pain,  moderate pain or cramping.   metFORMIN (GLUCOPHAGE) 500 MG tablet Take 1 tablet (500 mg total) by mouth daily with breakfast.   pantoprazole (PROTONIX) 40 MG tablet Take 1 tablet (40 mg total) by mouth daily.   pregabalin (LYRICA) 100 MG capsule Take 1 capsule (100 mg total) by mouth 2 (two) times daily. (Patient not taking: Reported on 10/08/2022)   propranolol (INDERAL) 20 MG tablet Take 2 tablets (40 mg total) by mouth 2 (two) times daily.   QUEtiapine (SEROQUEL) 50 MG tablet Take 1 tablet  (50 mg total) by mouth at bedtime.   tiZANidine (ZANAFLEX) 4 MG tablet Take 1 tablet (4 mg total) by mouth every 8 (eight) hours as needed for muscle spasms.   valACYclovir (VALTREX) 1000 MG tablet Take 1,000 mg by mouth daily.   Vitamin D, Ergocalciferol, (DRISDOL) 1.25 MG (50000 UNIT) CAPS capsule Take 1 capsule (50,000 Units total) by mouth once a week.   No facility-administered encounter medications on file as of 12/11/2022.    Recent Results (from the past 2160 hour(s))  POCT glycosylated hemoglobin (Hb A1C)     Status: Normal   Collection Time: 10/08/22  3:51 PM  Result Value Ref Range   Hemoglobin A1C     HbA1c POC (<> result, manual entry)     HbA1c, POC (prediabetic range)     HbA1c, POC (controlled diabetic range) 6.0 0.0 - 7.0 %  CMP14+EGFR     Status: Abnormal   Collection Time: 10/08/22  4:22 PM  Result Value Ref Range   Glucose 84 70 - 99 mg/dL   BUN 11 6 - 24 mg/dL   Creatinine, Ser 0.98 0.57 - 1.00 mg/dL   eGFR 119 >14 NW/GNF/6.21   BUN/Creatinine Ratio 14 9 - 23   Sodium 146 (H) 134 - 144 mmol/L   Potassium 5.0 3.5 - 5.2 mmol/L   Chloride 105 96 - 106 mmol/L   CO2 26 20 - 29 mmol/L   Calcium 10.0 8.7 - 10.2 mg/dL   Total Protein 7.7 6.0 - 8.5 g/dL   Albumin 4.6 3.9 - 4.9 g/dL   Globulin, Total 3.1 1.5 - 4.5 g/dL   Albumin/Globulin Ratio 1.5 1.2 - 2.2   Bilirubin Total 0.4 0.0 - 1.2 mg/dL   Alkaline Phosphatase 88 44 - 121 IU/L   AST 26 0 - 40 IU/L   ALT 29 0 - 32 IU/L     Psychiatric Specialty Exam: Physical Exam  Review of Systems  Musculoskeletal:  Positive for back pain.  Psychiatric/Behavioral:  Positive for dysphoric mood and sleep disturbance.     Weight 178 lb (80.7 kg).There is no height or weight on file to calculate BMI.  General Appearance: Casual  Eye Contact:  Fair  Speech:  Slow  Volume:  Decreased  Mood:  Dysphoric  Affect:  Labile  Thought Process:  Goal Directed  Orientation:  Full (Time, Place, and Person)  Thought Content:   Hallucinations: Auditory, Paranoid Ideation, and Rumination, having conversation with voices but no CAH  Suicidal Thoughts:  No  Homicidal Thoughts:  No  Memory:  Immediate;   Good Recent;   Good Remote;   Fair  Judgement:  Fair  Insight:  Fair  Psychomotor Activity:  Increased  Concentration:  Concentration: Fair and Attention Span: Fair  Recall:  Good  Fund of Knowledge:  Good  Language:  Good  Akathisia:  No  Handed:  Right  AIMS (if indicated):     Assets:  Communication Skills Desire for  Improvement Housing Transportation  ADL's:  Intact  Cognition:  WNL  Sleep:  fair, nightmares     Assessment/Plan: Schizoaffective disorder, bipolar type (HCC) - Plan: QUEtiapine (SEROQUEL) 100 MG tablet, busPIRone (BUSPAR) 5 MG tablet  Chronic post-traumatic stress disorder (PTSD) - Plan: QUEtiapine (SEROQUEL) 100 MG tablet, busPIRone (BUSPAR) 5 MG tablet  Mild tetrahydrocannabinol (THC) abuse - Plan: busPIRone (BUSPAR) 5 MG tablet  I review current symptoms, collateral information, current medication and blood work results.  Her last hemoglobin A1c 5.7.  Discussed Adderall symptoms and recommend to try Seroquel 100 mg at bedtime.  Patient agreed to give a try.  Will keep BuSpar 5 mg 2 times a day.  She is smoking marijuana and we did talk about interaction and provided psychoeducation about substance use.  Continue BuSpar 5 mg 2 times a day and therapy with Christina.  She is getting Cymbalta from her primary care for chronic back pain.  Encourage to continue walking and exercise to help her pain and continue to learn techniques to help her pain.  Resources provided.  Recommend to call us back if she has any question or any concern.  Follow-up in 3 months.   Follow Up Instructions:     I discussed the assessment and treatment plan with the patient. The patient was provided an opportunity to ask questions and all were answered. The patient agreed with the plan and demonstrated an  understanding of the instructions.   The patient was advised to call back or seek an in-person evaluation if the symptoms worsen or if the condition fails to improve as anticipated.    Collaboration of Care: Other provider involved in patient's care AEB notes are available in epic to review.  Patient/Guardian was advised Release of Information must be obtained prior to any record release in order to collaborate their care with an outside provider. Patient/Guardian was advised if they have not already done so to contact the registration department to sign all necessary forms in order for [place] to release information regarding their care.   Consent: Patient/Guardian gives verbal consent for treatment and assignment of benefits for services provided during this visit. Patient/Guardian expressed understanding and agreed to proceed.     I provided 24 minutes of non face to face time during this encounter.  Note: This document was prepared by [employer] voice dictation technology and any errors that results from this process are unintentional.    Cleotis Nipper, MD 12/11/2022

## 2023-01-02 ENCOUNTER — Other Ambulatory Visit: Payer: Self-pay | Admitting: Family Medicine

## 2023-01-04 ENCOUNTER — Other Ambulatory Visit: Payer: Self-pay | Admitting: Family Medicine

## 2023-01-04 DIAGNOSIS — M797 Fibromyalgia: Secondary | ICD-10-CM

## 2023-01-08 DIAGNOSIS — L648 Other androgenic alopecia: Secondary | ICD-10-CM | POA: Diagnosis not present

## 2023-01-08 DIAGNOSIS — D485 Neoplasm of uncertain behavior of skin: Secondary | ICD-10-CM | POA: Diagnosis not present

## 2023-01-22 DIAGNOSIS — L648 Other androgenic alopecia: Secondary | ICD-10-CM | POA: Diagnosis not present

## 2023-01-25 ENCOUNTER — Other Ambulatory Visit: Payer: Self-pay | Admitting: Family Medicine

## 2023-01-25 DIAGNOSIS — E1169 Type 2 diabetes mellitus with other specified complication: Secondary | ICD-10-CM

## 2023-01-25 DIAGNOSIS — E559 Vitamin D deficiency, unspecified: Secondary | ICD-10-CM

## 2023-01-30 ENCOUNTER — Other Ambulatory Visit: Payer: Self-pay | Admitting: Family Medicine

## 2023-01-30 DIAGNOSIS — E1169 Type 2 diabetes mellitus with other specified complication: Secondary | ICD-10-CM

## 2023-02-05 ENCOUNTER — Other Ambulatory Visit (HOSPITAL_COMMUNITY): Payer: Self-pay | Admitting: Psychiatry

## 2023-02-05 DIAGNOSIS — F4312 Post-traumatic stress disorder, chronic: Secondary | ICD-10-CM

## 2023-02-05 DIAGNOSIS — F1210 Cannabis abuse, uncomplicated: Secondary | ICD-10-CM

## 2023-02-05 DIAGNOSIS — F250 Schizoaffective disorder, bipolar type: Secondary | ICD-10-CM

## 2023-02-06 ENCOUNTER — Other Ambulatory Visit: Payer: Self-pay | Admitting: Family Medicine

## 2023-02-06 DIAGNOSIS — E1169 Type 2 diabetes mellitus with other specified complication: Secondary | ICD-10-CM

## 2023-02-06 NOTE — Telephone Encounter (Signed)
Unable to refill per protocol, Rx expired. Discontinued 05/08/22.  Requested Prescriptions  Pending Prescriptions Disp Refills   OZEMPIC, 0.25 OR 0.5 MG/DOSE, 2 MG/3ML SOPN [Pharmacy Med Name: Ozempic 0.25 mg or 0.5 mg (2 mg/3 mL) subcutaneous pen injector] 3 mL 6    Sig: Inject 0.5 mg into the skin once a week.     Endocrinology:  Diabetes - GLP-1 Receptor Agonists - semaglutide Passed - 02/06/2023 10:19 AM      Passed - HBA1C in normal range and within 180 days    HbA1c, POC (controlled diabetic range)  Date Value Ref Range Status  10/08/2022 6.0 0.0 - 7.0 % Final         Passed - Cr in normal range and within 360 days    Creatinine, Ser  Date Value Ref Range Status  10/08/2022 0.76 0.57 - 1.00 mg/dL Final         Passed - Valid encounter within last 6 months    Recent Outpatient Visits           4 months ago Type 2 diabetes mellitus with other specified complication, without long-term current use of insulin (HCC)   [place] [facility] [place], [place], MD   9 months ago Type 2 diabetes mellitus with other specified complication, without long-term current use of insulin (HCC)   [place] [facility] [place], [place], MD   1 year ago Type 2 diabetes mellitus with other specified complication, without long-term current use of insulin (HCC)   [place] [facility] [place], [place], MD   1 year ago Type 2 diabetes mellitus with other specified complication, without long-term current use of insulin (HCC)   [place] [facility] [place], Odette Horns, MD   1 year ago Rheumatoid arthritis involving multiple sites, unspecified whether rheumatoid factor present [facility])   [place] [facility] Hoy Register, MD       Future Appointments             In 2 months Hoy Register, MD [facility]

## 2023-02-07 ENCOUNTER — Ambulatory Visit (INDEPENDENT_AMBULATORY_CARE_PROVIDER_SITE_OTHER): Payer: Medicare HMO | Admitting: Licensed Clinical Social Worker

## 2023-02-07 DIAGNOSIS — F250 Schizoaffective disorder, bipolar type: Secondary | ICD-10-CM | POA: Diagnosis not present

## 2023-02-07 DIAGNOSIS — Z634 Disappearance and death of family member: Secondary | ICD-10-CM

## 2023-02-07 DIAGNOSIS — F4312 Post-traumatic stress disorder, chronic: Secondary | ICD-10-CM | POA: Diagnosis not present

## 2023-02-07 NOTE — Progress Notes (Signed)
Virtual Visit via Video Note  I connected with Victoria Holmes on 02/07/23  at  1:00 PM EDT by a video enabled telemedicine application and verified that I am speaking with the correct person using two identifiers.  Location: Patient: home Provider: [facility]   I discussed the limitations of evaluation and management by telemedicine and the availability of in person appointments. The patient expressed understanding and agreed to proceed.  I discussed the assessment and treatment plan with the patient. The patient was provided an opportunity to ask questions and all were answered. The patient agreed with the plan and demonstrated an understanding of the instructions.   The patient was advised to call back or seek an in-person evaluation if the symptoms worsen or if the condition fails to improve as anticipated.  I provided 15 minutes of non-face-to-face time during this encounter.   Jarrick Fjeld R Electa Sterry, LCSW   THERAPIST PROGRESS NOTE  Session Time: 1-140p  Participation Level: Minimal--pt was in pain with a migraine and in the bed at time of session so session was abbreviated.  Behavioral Response: NeatAlertAngry, Anxious, and Irritable  Type of Therapy: Individual Therapy  Treatment Goals addressed:  Reduce the negative impact trauma related symptoms have on social, occupational, and family functioning per pt self report 3 out of 5 sessions documented.    Develop healthy thinking patterns and beliefs about self, others, and the world that lead to the alleviation and help prevent the relapse of depression per self report 3 out of 5 sessions documented   ProgressTowards Goals: Not Progressing--fluctuating/intermittent progress due to losses and chronic pain.   Interventions: CBT, Motivational Interviewing, Supportive, and Anger Management Training  Summary: Victoria Holmes is a 42 y.o. female who presents with symptoms consistent with  schizoaffective disorder and chronic PTSD.  Pt states that she is compliant with her medication.   Explored current levels of mood and overall energy. Discussed depression symptoms and medication compliance. Pt states that she is compliant with her medication but feels that her energy level is very low. Pt states that she is in a lot of pain due to frequent migraines, and was having a migraine at time of session. Used motivational interviewing techniques to encourage pt to increase activity through the day, focus on overall prosocial behaviors, and engage in cognitively stimulating activities.   Clinician assisted pt with identifying current levels of chronic pain, and explored previously recommended (by medical community) interventions for treating chronic pain. Discussed overall impact of pain on daily activities, relationships, and ability/inability to engage in self care or recreational activities. Reviewed keeping balance between rest and activity, and to continue communicating with medical providers for pain management.   Allowed pt to explore thoughts and feelings associated with multiple recent losses. Discussed pts awareness of how loss has impacted overall life. Clinician provided empathy and support while giving patient safe space to explore grief.   Continued recommendations are as follows: self care behaviors, positive social engagements, focusing on overall work/home/life balance, and focusing on positive physical and emotional wellness.   Suicidal/Homicidal: No  Therapist Response: Limited progress noted due to limited interaction/assessment.    Plan: Informed patient that clinician will be leaving outpatient department. Allowed pt to explore any questions or concerns and discussed future counseling options/resources. Provided pt with psychoeducational resources and list of OPT therapists. Encouraged pt to continue with psychiatric med management appointments, if applicable.    Diagnosis:  Encounter Diagnoses  Name Primary?   Schizoaffective disorder, bipolar type (  HCC) Yes   Chronic post-traumatic stress disorder (PTSD)    Bereavement     Collaboration of Care: Other pt encouraged to continue care with psychiatrist of record, Dr. Kathryne Sharper  Patient/Guardian was advised Release of Information must be obtained prior to any record release in order to collaborate their care with an outside provider. Patient/Guardian was advised if they have not already done so to contact the registration department to sign all necessary forms in order for [place] to release information regarding their care.   Consent: Patient/Guardian gives verbal consent for treatment and assignment of benefits for services provided during this visit. Patient/Guardian expressed understanding and agreed to proceed.   Ernest Haber Jaz Laningham, LCSW 02/07/2023

## 2023-02-07 NOTE — Patient Instructions (Signed)
 Outpatient Psychiatry and Counseling  FOR CRISIS:  call 911, [facility]rapeutic Alternatives: Mobile Crisis Management 24 hours:  [phone], call [phone], [facility] ([facility]) [address] walk in, or go to your local EMERGENCY DEPARTMENT  [facility] [address], [place], [place] [address]  [phone]  [facility] [address] [place], [place] [address] [phone]  [facility] [address] [place],  [place]  [address] [phone]  [facility] Address: [address] [place]ine Cane [place], [place] [address] Phone: [phone]  [facility] [place] and [place] Locations) [email or web address]  [employer] [facility] [employer] fee and walk in schedule: M-F 8am-12pm/1pm-3pm [address]  [place], [place] [address] [phone]  [facility] [address] [place], [place] [address] [phone]  [facility] Outpatient Services/ Intensive Outpatient Therapy Program/CDIOP/PHP [address] [place], [place] [address] [phone]  [facility], Outpatient Therapy Services, [place]      [phone]     [address]    [place], [place] [address]                 [facility]   [facility] [phone]. [address] [place], [place] [address]  [facility]          [address] [place]  [place], [place] [address]       [phone]  [facility] [address] [place], [place] [address] [phone]  [facility]    [address]    [place], [place] [address]     [phone]       [facility] [address] [place] [address]  [facility]     [address] [place], [place]      [phone]       [facility] Victoria Ditch, MD [address] [place], [place] [address] [phone]  [facility]     [address]     [place], [place] [address]     [phone]       [facility] [address] [place], [place] [address] [phone] Resources for Temporary Residential Assistance/Crisis Centers

## 2023-02-13 ENCOUNTER — Other Ambulatory Visit (HOSPITAL_COMMUNITY): Payer: Self-pay | Admitting: Psychiatry

## 2023-02-13 DIAGNOSIS — F4312 Post-traumatic stress disorder, chronic: Secondary | ICD-10-CM

## 2023-02-13 DIAGNOSIS — F250 Schizoaffective disorder, bipolar type: Secondary | ICD-10-CM

## 2023-02-21 ENCOUNTER — Other Ambulatory Visit: Payer: Self-pay | Admitting: Family Medicine

## 2023-02-21 DIAGNOSIS — M069 Rheumatoid arthritis, unspecified: Secondary | ICD-10-CM

## 2023-02-21 DIAGNOSIS — I10 Essential (primary) hypertension: Secondary | ICD-10-CM

## 2023-02-21 DIAGNOSIS — M797 Fibromyalgia: Secondary | ICD-10-CM

## 2023-02-27 ENCOUNTER — Other Ambulatory Visit (HOSPITAL_COMMUNITY)
Admission: RE | Admit: 2023-02-27 | Discharge: 2023-02-27 | Disposition: A | Discharge status: Home / Self Care | Payer: Medicare HMO | Source: Ambulatory Visit | Attending: Obstetrics and Gynecology | Admitting: Obstetrics and Gynecology

## 2023-02-27 ENCOUNTER — Other Ambulatory Visit: Payer: Self-pay | Admitting: Obstetrics and Gynecology

## 2023-02-27 DIAGNOSIS — Z01419 Encounter for gynecological examination (general) (routine) without abnormal findings: Secondary | ICD-10-CM | POA: Insufficient documentation

## 2023-02-27 DIAGNOSIS — Z1151 Encounter for screening for human papillomavirus (HPV): Secondary | ICD-10-CM | POA: Diagnosis not present

## 2023-03-01 LAB — CYTOLOGY - PAP
Comment: NEGATIVE
Diagnosis: NEGATIVE
High risk HPV: NEGATIVE

## 2023-03-18 ENCOUNTER — Encounter (HOSPITAL_COMMUNITY): Payer: Self-pay | Admitting: Psychiatry

## 2023-03-18 ENCOUNTER — Telehealth (HOSPITAL_COMMUNITY): Payer: Medicare HMO | Admitting: Psychiatry

## 2023-03-18 VITALS — Wt 178.0 lb

## 2023-03-18 DIAGNOSIS — F250 Schizoaffective disorder, bipolar type: Secondary | ICD-10-CM | POA: Diagnosis not present

## 2023-03-18 DIAGNOSIS — F1210 Cannabis abuse, uncomplicated: Secondary | ICD-10-CM

## 2023-03-18 DIAGNOSIS — F4312 Post-traumatic stress disorder, chronic: Secondary | ICD-10-CM

## 2023-03-18 MED ORDER — BUSPIRONE HCL 5 MG PO TABS
5.0000 mg | ORAL_TABLET | Freq: Three times a day (TID) | ORAL | 2 refills | Status: AC
Start: 2023-03-18 — End: 2023-06-16

## 2023-03-18 MED ORDER — QUETIAPINE FUMARATE 100 MG PO TABS
100.0000 mg | ORAL_TABLET | Freq: Every day | ORAL | 2 refills | Status: DC
Start: 2023-03-18 — End: 2023-09-26

## 2023-03-18 NOTE — Progress Notes (Signed)
[facility] MD Virtual Progress Note   Patient Location: Home Provider Location: Home Office  I connect with patient by video and verified that I am speaking with correct person by using two identifiers. I discussed the limitations of evaluation and management by telemedicine and the availability of in person appointments. I also discussed with the patient that there may be a patient responsible charge related to this service. The patient expressed understanding and agreed to proceed.  Victoria Holmes [id number] 42 y.o.  03/18/2023 2:21 PM  History of Present Illness:  Patient is evaluated by video session.  On the last visit we increased the Seroquel and she is taking 100 mg at bedtime.  She noticed improvement in her sleep but sometimes she feels pulsating vein on her left side of the neck at that causes anxiety and insomnia.  Patient told she was diagnosed with pseudotumor and see neurology but has not had a follow-up in a while.  She still have trust issues, flashback, nightmares and chronic hallucination.  She tried to keep herself busy and does not interact with people because she gets irritable and angry.  She liked going to her previous church which [place] away but sometimes it is difficult to travel that long.  Sometimes she do [employer] activities.  She is taking BuSpar, Seroquel and Cymbalta from primary care.  She lives by herself now she has her own apartment.  She has few cousins here and there but she not very close to the family members.  She liked to stay away from the family because of a lot of issues in the past.  She continues to smoke marijuana and does not feel it is a issue.  She has appointment coming up with her primary care.  Her appetite is okay.  Her weight is unchanged from the past.  She feels the more she stayed isolated and by herself it is good for her because she does not like around people.  She has no tremors or shakes or any EPS.  Past  Psychiatric History: H/O PTSD, schizoaffective disorder, and bipolar disorder.  H/O cutting wrist and neck in 2007 and multiple inpatient.  Seroquel helped but higher doses made groggy. Tried Depakote but no details. Tried Ativan and Xanax.  Zoloft, Klonopin had side effects.  As per chart she also had tried Prozac, Concerta, Lexapro.  Has seen [facility] [place] and [facility].    Outpatient Encounter Medications as of 03/18/2023  Medication Sig   Accu-Chek Softclix Lancets lancets Use to check blood sugar three times daily. E11.65   amLODipine (NORVASC) 5 MG tablet Take 1 tablet (5 mg total) by mouth daily.   atorvastatin (LIPITOR) 20 MG tablet Take 1 tablet (20 mg total) by mouth daily.   Blood Glucose Monitoring Suppl (ACCU-CHEK GUIDE ME) w/Device KIT Use to check blood sugar three times daily. E11.65   DULoxetine (CYMBALTA) 60 MG capsule Take 1 capsule (60 mg total) by mouth daily.   EPINEPHrine 0.3 mg/0.3 mL IJ SOAJ injection Inject 0.3 mg into the muscle as needed for anaphylaxis.   glucose blood (ACCU-CHEK GUIDE) test strip Use to check blood sugar three times daily. E11.65   hydroxychloroquine (PLAQUENIL) 200 MG tablet Take 200 mg by mouth 2 (two) times daily.   ibuprofen (ADVIL,MOTRIN) 200 MG tablet Take 200 mg by mouth every 6 (six) hours as needed for fever, headache, mild pain, moderate pain or cramping.   metFORMIN (GLUCOPHAGE) 500 MG tablet Take 1 tablet (  500 mg total) by mouth daily with breakfast.   pantoprazole (PROTONIX) 40 MG tablet Take 1 tablet (40 mg total) by mouth daily.   pregabalin (LYRICA) 100 MG capsule Take 1 capsule (100 mg total) by mouth 2 (two) times daily. (Patient not taking: Reported on 10/08/2022)   propranolol (INDERAL) 20 MG tablet Take 2 tablets (40 mg total) by mouth 2 (two) times daily.   QUEtiapine (SEROQUEL) 100 MG tablet Take 1 tablet (100 mg total) by mouth at bedtime.   tiZANidine (ZANAFLEX) 4 MG tablet Take 1 tablet (4 mg total) by mouth  every 8 (eight) hours as needed for muscle spasms.   valACYclovir (VALTREX) 1000 MG tablet Take 1,000 mg by mouth daily.   Vitamin D, Ergocalciferol, (DRISDOL) 1.25 MG (50000 UNIT) CAPS capsule TAKE ONE CAPSULE BY MOUTH Once weekly   No facility-administered encounter medications on file as of 03/18/2023.    Recent Results (from the past 2160 hour(s))  Cytology - PAP     Status: None   Collection Time: 02/27/23 12:00 AM  Result Value Ref Range   High risk HPV Negative    Adequacy      Satisfactory for evaluation; transformation zone component PRESENT.   Diagnosis      - Negative for intraepithelial lesion or malignancy (NILM)   Comment Normal Reference Range HPV - Negative      Psychiatric Specialty Exam: Physical Exam  Review of Systems  Weight 178 lb (80.7 kg).There is no height or weight on file to calculate BMI.  General Appearance: Casual  Eye Contact:  Fair  Speech:  Slow  Volume:  Decreased  Mood:  Dysphoric  Affect:  Congruent  Thought Process:  Descriptions of Associations: Intact  Orientation:  Full (Time, Place, and Person)  Thought Content:  Hallucinations: chronic, Paranoid Ideation, and Rumination  Suicidal Thoughts:  No  Homicidal Thoughts:  No  Memory:  Immediate;   Good Recent;   Good Remote;   Fair  Judgement:  Fair  Insight:  Fair  Psychomotor Activity:  Decreased  Concentration:  Concentration: Fair and Attention Span: Fair  Recall:  Good  Fund of Knowledge:  Fair  Language:  Good  Akathisia:  No  Handed:  Right  AIMS (if indicated):     Assets:  Communication Skills Desire for Improvement Housing Transportation  ADL's:  Intact  Cognition:  WNL  Sleep:  better but still dreams     Assessment/Plan: Schizoaffective disorder, bipolar type (HCC) - Plan: QUEtiapine (SEROQUEL) 100 MG tablet  Chronic post-traumatic stress disorder (PTSD) - Plan: QUEtiapine (SEROQUEL) 100 MG tablet, busPIRone (BUSPAR) 5 MG tablet  Mild tetrahydrocannabinol  (THC) abuse - Plan: busPIRone (BUSPAR) 5 MG tablet  Review current medication and psychosocial.  She lives by herself and does not like to be around people.  She liked increase Seroquel but still sometimes nightmares and flashback.  She feels sometime pulsating blood on her left side of the neck.  I encouraged to follow up with neurology as she has given the diagnosis of pseudotumor.  Since her previous therapist left she tried to connect with another therapist but did not go very well.  She promised that she will give more time and more try to find a therapist for her.  I encouraged to keep appointment with primary care.  Continue Seroquel 100 mg at bedtime.  We also discussed to try BuSpar higher dose and she agreed with the plan and if noticed that higher dose of BuSpar not working that  she will go back to 5 mg twice a day.  She is getting Cymbalta 60 mg from primary care for chronic pain.  Encouraged walking, taking deep breath when she is anxious and nervous.  Patient does not want to quit smoking marijuana because she feels it does help her anxiety.  Discussed substance interaction with psychotropic medication.  Follow up in 3 months.   Follow Up Instructions:     I discussed the assessment and treatment plan with the patient. The patient was provided an opportunity to ask questions and all were answered. The patient agreed with the plan and demonstrated an understanding of the instructions.   The patient was advised to call back or seek an in-person evaluation if the symptoms worsen or if the condition fails to improve as anticipated.    Collaboration of Care: Other provider involved in patient's care AEB notes are available in epic to review.  Patient/Guardian was advised Release of Information must be obtained prior to any record release in order to collaborate their care with an outside provider. Patient/Guardian was advised if they have not already done so to contact the registration  department to sign all necessary forms in order for [place] to release information regarding their care.   Consent: Patient/Guardian gives verbal consent for treatment and assignment of benefits for services provided during this visit. Patient/Guardian expressed understanding and agreed to proceed.     I provided 34 minutes of non face to face time during this encounter.  Note: This document was prepared by [employer] voice dictation technology and any errors that results from this process are unintentional.    Cleotis Nipper, MD 03/18/2023

## 2023-04-05 ENCOUNTER — Telehealth: Payer: Self-pay | Admitting: *Deleted

## 2023-04-05 NOTE — Progress Notes (Signed)
Care Coordination   Note   04/05/2023 Name: [name] MRN: [id number] DOB: 02-Apr-1981  Victoria Holmes is a 42 y.o. year old female who sees Hoy Register, MD for primary care. I reached out to Vanessa Silex by phone today to offer care coordination services.  Victoria Holmes was given information about Care Coordination services today including:   The Care Coordination services include support from the care team which includes your Nurse Coordinator, Clinical Social Worker, or Pharmacist.  The Care Coordination team is here to help remove barriers to the health concerns and goals most important to you. Care Coordination services are voluntary, and the patient may decline or stop services at any time by request to their care team member.   Care Coordination Consent Status: Patient agreed to services and verbal consent obtained.   Follow up plan:  Telephone appointment with care coordination team member scheduled for:  04/23/23  Encounter Outcome:  Patient Scheduled  [facility] Coordination Care Guide  Direct Dial: [phone]

## 2023-04-10 ENCOUNTER — Other Ambulatory Visit: Payer: Self-pay | Admitting: Family Medicine

## 2023-04-10 ENCOUNTER — Encounter: Payer: Self-pay | Admitting: Family Medicine

## 2023-04-10 ENCOUNTER — Other Ambulatory Visit (HOSPITAL_COMMUNITY)
Admission: RE | Admit: 2023-04-10 | Discharge: 2023-04-10 | Disposition: A | Discharge status: Home / Self Care | Payer: Medicare HMO | Source: Ambulatory Visit | Attending: Family Medicine | Admitting: Family Medicine

## 2023-04-10 ENCOUNTER — Ambulatory Visit: Payer: Medicare HMO | Attending: Family Medicine | Admitting: Family Medicine

## 2023-04-10 VITALS — BP 122/80 | HR 84 | Ht 62.0 in | Wt 176.6 lb

## 2023-04-10 DIAGNOSIS — I10 Essential (primary) hypertension: Secondary | ICD-10-CM

## 2023-04-10 DIAGNOSIS — R202 Paresthesia of skin: Secondary | ICD-10-CM | POA: Diagnosis not present

## 2023-04-10 DIAGNOSIS — R300 Dysuria: Secondary | ICD-10-CM | POA: Diagnosis not present

## 2023-04-10 DIAGNOSIS — E1169 Type 2 diabetes mellitus with other specified complication: Secondary | ICD-10-CM

## 2023-04-10 DIAGNOSIS — N898 Other specified noninflammatory disorders of vagina: Secondary | ICD-10-CM | POA: Diagnosis not present

## 2023-04-10 DIAGNOSIS — L84 Corns and callosities: Secondary | ICD-10-CM

## 2023-04-10 DIAGNOSIS — M797 Fibromyalgia: Secondary | ICD-10-CM

## 2023-04-10 DIAGNOSIS — Z7984 Long term (current) use of oral hypoglycemic drugs: Secondary | ICD-10-CM | POA: Diagnosis not present

## 2023-04-10 DIAGNOSIS — E559 Vitamin D deficiency, unspecified: Secondary | ICD-10-CM

## 2023-04-10 LAB — POCT GLYCOSYLATED HEMOGLOBIN (HGB A1C): HbA1c, POC (controlled diabetic range): 6.3 % (ref 0.0–7.0)

## 2023-04-10 LAB — POCT URINALYSIS DIP (CLINITEK)
Bilirubin, UA: NEGATIVE
Glucose, UA: NEGATIVE mg/dL
Ketones, POC UA: NEGATIVE mg/dL
Nitrite, UA: NEGATIVE
POC PROTEIN,UA: NEGATIVE
Spec Grav, UA: <=1.005 — AB (ref 1.010–1.025)
Urobilinogen, UA: 0.2 U/dL
pH, UA: 6 (ref 5.0–8.0)

## 2023-04-10 MED ORDER — AMLODIPINE BESYLATE 5 MG PO TABS
5.0000 mg | ORAL_TABLET | Freq: Every day | ORAL | 1 refills | Status: DC
Start: 2023-04-10 — End: 2023-10-09

## 2023-04-10 MED ORDER — PANTOPRAZOLE SODIUM 40 MG PO TBEC: 40.0000 mg | DELAYED_RELEASE_TABLET | Freq: Every day | ORAL | 1 refills | Status: DC

## 2023-04-10 MED ORDER — PROPRANOLOL HCL 20 MG PO TABS: 40.0000 mg | ORAL_TABLET | Freq: Two times a day (BID) | ORAL | 1 refills | Status: DC

## 2023-04-10 MED ORDER — PREGABALIN 100 MG PO CAPS
100.0000 mg | ORAL_CAPSULE | Freq: Two times a day (BID) | ORAL | 3 refills | Status: DC
Start: 2023-04-10 — End: 2023-08-28

## 2023-04-10 MED ORDER — METFORMIN HCL 500 MG PO TABS
500.0000 mg | ORAL_TABLET | Freq: Every day | ORAL | 1 refills | Status: DC
Start: 2023-04-10 — End: 2023-10-09

## 2023-04-10 MED ORDER — ATORVASTATIN CALCIUM 20 MG PO TABS
20.0000 mg | ORAL_TABLET | Freq: Every day | ORAL | 1 refills | Status: DC
Start: 2023-04-10 — End: 2023-10-09

## 2023-04-10 NOTE — Progress Notes (Signed)
Subjective:  Patient ID: Victoria Holmes, female    DOB: 05-17-1981  Age: 42 y.o. MRN: [id number]  CC: Medical Management of Chronic Issues (UTI,vaginal itching)   HPI Victoria Holmes is a 42 y.o. year old female with a history of HTN, type 2 diabetes mellitus (A1c 5.7), rheumatoid arthritis, lupus, Raynaud's syndrome, Schizoaffective disorder, PTSD, on Valtrex for Herpes Simplex prophylaxis, pseudotumor cerebri, H. pylori gastritis, chronic nausea and vomiting.   Interval History: Discussed the use of AI scribe software for clinical note transcription with the patient, who gave verbal consent to proceed.   She presents with intermittent vaginal itching. She denies discharge and note that the itching is not constant, but comes and goes. She has not previously experienced a yeast infection and are unsure if this could be the cause. She recently had sexual intercourse for the first time in nine years. She also reports a pulsating pain at the end of urination and has noticed that her urine is cloudy.  She has been managing her diabetes with metformin, which she takes daily. She reports that her morning blood glucose levels are often low, sometimes as low as 79 or 83, and she adjust their metformin intake accordingly to avoid hypoglycemia. She has experienced hypoglycemia in the past, particularly in January and February of this year.  For her rheumatoid arthritis she is seeing a rheumatologist at [facility].  Her schizoaffective disorder is managed by psychiatry, Dr.Arfeen and she is in the process of finding a new therapist as her previous one was promoted and has left her position.       Past Medical History:  Diagnosis Date   Anemia    no current med.   Anxiety    Chronic tonsillitis 03/2012   Depression    Diabetes mellitus    diet-controlled   Fibrocystic breast disease    Headache(784.0)    migraines   History of endometriosis    Hyperlipidemia    Hypertension    under control,  has been on med. x 1 yr.   Lupus (HCC)    Lupus (HCC)    Neuritis    Panic attacks    Raynauds disease    Rheumatoid arthritis(714.0)    Schizoaffective disorder (HCC)    Seizures (HCC)    last seizure > 1 yr. ago   Sinusitis    Sleep disturbance    Thyroid nodule     Past Surgical History:  Procedure Laterality Date   ABDOMINAL HYSTERECTOMY  6 yrs. ago   complete   CHOLECYSTECTOMY  6-8 yrs. ago   FOOT SURGERY  05/2011   right great toe   TONSILLECTOMY  04/22/2012   Procedure: TONSILLECTOMY;  Surgeon: Drema Halon, MD;  Location: [facility];  Service: ENT;  Laterality: N/A;    Family History  Problem Relation Age of Onset   Colon cancer Father    Heart attack Maternal Grandmother        late 40's   Arthritis Maternal Grandfather    Cancer Maternal Grandfather        lung   Diabetes Paternal Grandmother    Hypertension Paternal Grandmother    Kidney disease Paternal Grandmother    Lupus Other    Pancreatic cancer Neg Hx    Stomach cancer Neg Hx    Liver cancer Neg Hx    Esophageal cancer Neg Hx     Social History   Socioeconomic History   Marital status: Single    Spouse name:  Not on file   Number of children: 1   Years of education: Not on file   Highest education level: Not on file  Occupational History   Occupation: [employer]  Tobacco Use   Smoking status: Former    Current packs/day: 0.00    Average packs/day: 0.3 packs/day for 3.0 years (0.8 ttl pk-yrs)    Types: Cigarettes    Start date: 01/2016    Quit date: 01/2019    Years since quitting: 4.2   Smokeless tobacco: Never   Tobacco comments:    3 cig./day  Vaping Use   Vaping status: Never Used  Substance and Sexual Activity   Alcohol use: Yes    Alcohol/week: 0.0 standard drinks of alcohol    Comment: occasionally   Drug use: Yes    Types: Marijuana   Sexual activity: Not on file  Other Topics Concern   Not on file  Social History Narrative   Not on file    Social Determinants of Health   Financial Resource Strain: Low Risk  (08/01/2022)   Received from [facility], [facility]   Overall Financial Resource Strain (CARDIA)    Difficulty of Paying Living Expenses: Not hard at all  Food Insecurity: Food Insecurity Present (08/01/2022)   Received from [facility], [facility]   Hunger Vital Sign    Worried About Running Out of Food in the Last Year: Sometimes true    Ran Out of Food in the Last Year: Sometimes true  Transportation Needs: Unmet Transportation Needs (08/01/2022)   Received from [employer], [facility]   PRAPARE - Transportation    Lack of Transportation (Medical): Yes    Lack of Transportation (Non-Medical): No  Physical Activity: Insufficiently Active (08/01/2022)   Received from [facility], [facility]   Exercise Vital Sign    Days of Exercise per Week: 4 days    Minutes of Exercise per Session: 30 min  Stress: Stress Concern Present (08/01/2022)   Received from [facility], [facility] - Occupational Stress Questionnaire    Feeling of Stress : To some extent  Social Connections: Somewhat Isolated (08/01/2022)   Received from [facility], [facility]   Social Network    How would you rate your social network (family, work, friends)?: Restricted participation with some degree of social isolation    Allergies  Allergen Reactions   Amoxicillin Hives and Rash    Has patient had a PCN reaction causing immediate rash, facial/tongue/throat swelling, SOB or lightheadedness with hypotension: Yes Has patient had a PCN reaction causing severe rash involving mucus membranes or skin necrosis: Yes Has patient had a PCN reaction that required hospitalization: No Has patient had a PCN reaction occurring within the last 10 years: No If all of the above answers are "NO", then may proceed with Cephalosporin use.    Azithromycin Shortness Of Breath and Rash   Darvocet  [Propoxyphene N-Acetaminophen] Shortness Of Breath and Rash   Grapeseed Extract [Nutritional Supplements] Shortness Of Breath, Swelling and Rash    GRAPES   Kiwi Extract Shortness Of Breath, Swelling and Rash   Latex Shortness Of Breath   Macrolides And Ketolides Shortness Of Breath and Rash   Morphine And Codeine Shortness Of Breath, Rash and Other (See Comments)    MUSCLE RIGIDITY   Sulfa Antibiotics Shortness Of Breath    MUSCLE RIGIDITY   Klonopin [Clonazepam] Rash and Other (See Comments)    HOMICIDAL THOUGHTS   Penicillins  Itching and Swelling    Swelling of mouth Has patient had a PCN reaction causing immediate rash, facial/tongue/throat swelling, SOB or lightheadedness with hypotension: Yes Has patient had a PCN reaction causing severe rash involving mucus membranes or skin necrosis: No Has patient had a PCN reaction that required hospitalization: No Has patient had a PCN reaction occurring within the last 10 years: No If all of the above answers are "NO", then may proceed with Cephalosporin use.    Zoloft [Sertraline Hcl] Other (See Comments)    TARDIVE DYSKINESIA   Contrast Media [Iodinated Contrast Media] Swelling    Pt CAN NOT have drinking contrast; She CAN have IV contrast; when pt drinks water soluble contrast, she develops shallow breathing and hives along with Herpes Type 2 around her eyes   Flagyl [Metronidazole] Nausea And Vomiting   Adhesive [Tape] Rash   Concerta [Methylphenidate] Nausea Only   Lexapro [Escitalopram Oxalate] Diarrhea and Rash   Lortab [Hydrocodone-Acetaminophen] Rash    Outpatient Medications Prior to Visit  Medication Sig Dispense Refill   Accu-Chek Softclix Lancets lancets Use to check blood sugar three times daily. E11.65 100 each 3   Blood Glucose Monitoring Suppl (ACCU-CHEK GUIDE ME) w/Device KIT Use to check blood sugar three times daily. E11.65 1 kit 0   busPIRone (BUSPAR) 5 MG tablet Take 1 tablet (5 mg total) by mouth 3 (three) times  daily. 90 tablet 2   DULoxetine (CYMBALTA) 60 MG capsule Take 1 capsule (60 mg total) by mouth daily. 90 capsule 0   EPINEPHrine 0.3 mg/0.3 mL IJ SOAJ injection Inject 0.3 mg into the muscle as needed for anaphylaxis. 1 each 2   glucose blood (ACCU-CHEK GUIDE) test strip Use to check blood sugar three times daily. E11.65 100 each 3   hydroxychloroquine (PLAQUENIL) 200 MG tablet Take 200 mg by mouth 2 (two) times daily.     ibuprofen (ADVIL,MOTRIN) 200 MG tablet Take 200 mg by mouth every 6 (six) hours as needed for fever, headache, mild pain, moderate pain or cramping.     QUEtiapine (SEROQUEL) 100 MG tablet Take 1 tablet (100 mg total) by mouth at bedtime. 30 tablet 2   tiZANidine (ZANAFLEX) 4 MG tablet Take 1 tablet (4 mg total) by mouth every 8 (eight) hours as needed for muscle spasms. 270 tablet 1   valACYclovir (VALTREX) 1000 MG tablet Take 1,000 mg by mouth daily.     Vitamin D, Ergocalciferol, (DRISDOL) 1.25 MG (50000 UNIT) CAPS capsule TAKE ONE CAPSULE BY MOUTH Once weekly 12 capsule 0   amLODipine (NORVASC) 5 MG tablet Take 1 tablet (5 mg total) by mouth daily. 90 tablet 0   atorvastatin (LIPITOR) 20 MG tablet Take 1 tablet (20 mg total) by mouth daily. 90 tablet 0   metFORMIN (GLUCOPHAGE) 500 MG tablet Take 1 tablet (500 mg total) by mouth daily with breakfast. 180 tablet 1   pantoprazole (PROTONIX) 40 MG tablet Take 1 tablet (40 mg total) by mouth daily. 90 tablet 0   pregabalin (LYRICA) 100 MG capsule Take 1 capsule (100 mg total) by mouth 2 (two) times daily. 60 capsule 3   propranolol (INDERAL) 20 MG tablet Take 2 tablets (40 mg total) by mouth 2 (two) times daily. 360 tablet 2   No facility-administered medications prior to visit.     ROS Review of Systems  Constitutional:  Negative for activity change and appetite change.  HENT:  Negative for sinus pressure and sore throat.   Respiratory:  Negative for chest  tightness, shortness of breath and wheezing.   Cardiovascular:   Negative for chest pain and palpitations.  Gastrointestinal:  Negative for abdominal distention, abdominal pain and constipation.  Genitourinary: Negative.   Musculoskeletal: Negative.   Psychiatric/Behavioral:  Negative for behavioral problems and dysphoric mood.     Objective:  BP 122/80   Pulse 84   Ht 5\' 2"  (1.575 m)   Wt 176 lb 9.6 oz (80.1 kg)   SpO2 98%   BMI 32.30 kg/m      04/10/2023    4:02 PM 03/18/2023    2:38 PM 12/11/2022    2:56 PM  BP/Weight  Systolic BP 122    Diastolic BP 80    Wt. (Lbs) 176.6    BMI 32.3 kg/m2       Information is confidential and restricted. Go to Review Flowsheets to unlock data.      Physical Exam Constitutional:      Appearance: She is well-developed.  Cardiovascular:     Rate and Rhythm: Normal rate.     Heart sounds: Normal heart sounds. No murmur heard. Pulmonary:     Effort: Pulmonary effort is normal.     Breath sounds: Normal breath sounds. No wheezing or rales.  Chest:     Chest wall: No tenderness.  Abdominal:     General: Bowel sounds are normal. There is no distension.     Palpations: Abdomen is soft. There is no mass.     Tenderness: There is no abdominal tenderness.  Musculoskeletal:        General: Normal range of motion.     Right lower leg: No edema.     Left lower leg: No edema.  Neurological:     Mental Status: She is alert and oriented to person, place, and time.  Psychiatric:        Mood and Affect: Mood normal.    Diabetic Foot Exam - Simple   Simple Foot Form Diabetic Foot exam was performed with the following findings: Yes 04/10/2023  4:32 PM  Visual Inspection See comments: Yes Sensation Testing Intact to touch and monofilament testing bilaterally: Yes Pulse Check Posterior Tibialis and Dorsalis pulse intact bilaterally: Yes Comments Callus of left great toe.  No ulceration        Latest Ref Rng & Units 10/08/2022    4:22 PM 04/19/2022   11:59 AM 04/02/2022    9:10 PM  CMP  Glucose 70  - 99 mg/dL 84  742  595   BUN 6 - 24 mg/dL 11  <5  7   Creatinine 0.57 - 1.00 mg/dL 6.38  7.56  4.33   Sodium 134 - 144 mmol/L 146  145  140   Potassium 3.5 - 5.2 mmol/L 5.0  4.4  3.4   Chloride 96 - 106 mmol/L 105  110  107   CO2 20 - 29 mmol/L 26  26  21    Calcium 8.7 - 10.2 mg/dL 29.5  9.9  18.8   Total Protein 6.0 - 8.5 g/dL 7.7  7.9    Total Bilirubin 0.0 - 1.2 mg/dL 0.4  0.5    Alkaline Phos 44 - 121 IU/L 88  65    AST 0 - 40 IU/L 26  22    ALT 0 - 32 IU/L 29  31      Lipid Panel     Component Value Date/Time   CHOL 99 (L) 10/25/2021 0900   TRIG 65 10/25/2021 0900   HDL 40 10/25/2021  0900   CHOLHDL 4.3 11/10/2020 0928   LDLCALC 45 10/25/2021 0900    CBC    Component Value Date/Time   WBC 6.0 04/19/2022 1159   RBC 4.93 04/19/2022 1159   HGB 13.2 04/19/2022 1159   HCT 41.0 04/19/2022 1159   PLT 231 04/19/2022 1159   MCV 83.2 04/19/2022 1159   MCH 26.8 04/19/2022 1159   MCHC 32.2 04/19/2022 1159   RDW 15.1 04/19/2022 1159   LYMPHSABS 1.9 04/19/2022 1159   MONOABS 0.4 04/19/2022 1159   EOSABS 0.1 04/19/2022 1159   BASOSABS 0.0 04/19/2022 1159    Lab Results  Component Value Date   HGBA1C 6.3 04/10/2023    Assessment & Plan:      Vaginal Itching Intermittent vaginal itching without discharge. No current symptoms. Swab sent for testing. -Wait for swab results to determine further management.  Urinary Symptoms Reports of post-urination pain and cloudy urine. Urine test shows 1+ leukocyte esterase and slight blood. -Send urine for culture. If positive, initiate treatment. If negative, no treatment necessary.  Rheumatoid Arthritis Stable with no flares Currently under the care of a rheumatologist. -Continue current management with rheumatologist.  Schizoaffective Disorder Currently under the care of Dr. Lolly Mustache and in the process of finding a new therapist. -Continue current management with psych and establish care with a new therapist.  Type 2 diabetes  mellitus Diabetes Mellitus A1C 6.3, indicating good control. Reports of low morning blood glucose readings. -Continue Metformin 500mg  daily . -Check blood glucose regularly and if blood sugars remain low consider decreasing to 250 mg of metformin  Foot Care Callus on left great toe, no ulcerations. Reports of needing a new podiatrist. -Refer to a podiatrist for foot care.  Primary hypertension Controlled -Counseled on blood pressure goal of less than 130/80, low-sodium, DASH diet, medication compliance, 150 minutes of moderate intensity exercise per week. Discussed medication compliance, adverse effects.  General Health Maintenance -Order fasting labs for next week to check cholesterol and kidney function. -Check Vitamin D levels due to past deficiency. -Continue current management of other chronic conditions.          Meds ordered this encounter  Medications   amLODipine (NORVASC) 5 MG tablet    Sig: Take 1 tablet (5 mg total) by mouth daily.    Dispense:  90 tablet    Refill:  1   atorvastatin (LIPITOR) 20 MG tablet    Sig: Take 1 tablet (20 mg total) by mouth daily.    Dispense:  90 tablet    Refill:  1   metFORMIN (GLUCOPHAGE) 500 MG tablet    Sig: Take 1 tablet (500 mg total) by mouth daily with breakfast.    Dispense:  180 tablet    Refill:  1   pantoprazole (PROTONIX) 40 MG tablet    Sig: Take 1 tablet (40 mg total) by mouth daily.    Dispense:  90 tablet    Refill:  1    This prescription was filled on 01/02/2023. Any refills authorized will be placed on file.   pregabalin (LYRICA) 100 MG capsule    Sig: Take 1 capsule (100 mg total) by mouth 2 (two) times daily.    Dispense:  60 capsule    Refill:  3   propranolol (INDERAL) 20 MG tablet    Sig: Take 2 tablets (40 mg total) by mouth 2 (two) times daily.    Dispense:  360 tablet    Refill:  1    Follow-up: Return  in about 6 months (around 10/08/2023) for Chronic medical conditions.       Hoy Register,  MD, FAAFP. [facility] [place], [place] [phone]   04/10/2023, 5:51 PM

## 2023-04-10 NOTE — Patient Instructions (Signed)
Exercising to Stay Healthy To become healthy and stay healthy, it is recommended that you do moderate-intensity and vigorous-intensity exercise. You can tell that you are exercising at a moderate intensity if your heart starts beating faster and you start breathing faster but can still hold a conversation. You can tell that you are exercising at a vigorous intensity if you are breathing much harder and faster and cannot hold a conversation while exercising. How can exercise benefit me? Exercising regularly is important. It has many health benefits, such as: Improving overall fitness, flexibility, and endurance. Increasing bone density. Helping with weight control. Decreasing body fat. Increasing muscle strength and endurance. Reducing stress and tension, anxiety, depression, or anger. Improving overall health. What guidelines should I follow while exercising? Before you start a new exercise program, talk with your health care provider. Do not exercise so much that you hurt yourself, feel dizzy, or get very short of breath. Wear comfortable clothes and wear shoes with good support. Drink plenty of water while you exercise to prevent dehydration or heat stroke. Work out until your breathing and your heartbeat get faster (moderate intensity). How often should I exercise? Choose an activity that you enjoy, and set realistic goals. Your health care provider can help you make an activity plan that is individually designed and works best for you. Exercise regularly as told by your health care provider. This may include: Doing strength training two times a week, such as: Lifting weights. Using resistance bands. Push-ups. Sit-ups. Yoga. Doing a certain intensity of exercise for a given amount of time. Choose from these options: A total of 150 minutes of moderate-intensity exercise every week. A total of 75 minutes of vigorous-intensity exercise every week. A mix of moderate-intensity and  vigorous-intensity exercise every week. Children, pregnant women, people who have not exercised regularly, people who are overweight, and older adults may need to talk with a health care provider about what activities are safe to perform. If you have a medical condition, be sure to talk with your health care provider before you start a new exercise program. What are some exercise ideas? Moderate-intensity exercise ideas include: Walking 1 mile (1.6 km) in about 15 minutes. Biking. Hiking. Golfing. Dancing. Water aerobics. Vigorous-intensity exercise ideas include: Walking 4.5 miles (7.2 km) or more in about 1 hour. Jogging or running 5 miles (8 km) in about 1 hour. Biking 10 miles (16.1 km) or more in about 1 hour. Lap swimming. Roller-skating or in-line skating. Cross-country skiing. Vigorous competitive sports, such as football, basketball, and soccer. Jumping rope. Aerobic dancing. What are some everyday activities that can help me get exercise? Yard work, such as: Pushing a lawn mower. Raking and bagging leaves. Washing your car. Pushing a stroller. Shoveling snow. Gardening. Washing windows or floors. How can I be more active in my day-to-day activities? Use stairs instead of an elevator. Take a walk during your lunch break. If you drive, park your car farther away from your work or school. If you take public transportation, get off one stop early and walk the rest of the way. Stand up or walk around during all of your indoor phone calls. Get up, stretch, and walk around every 30 minutes throughout the day. Enjoy exercise with a friend. Support to continue exercising will help you keep a regular routine of activity. Where to find more information You can find more information about exercising to stay healthy from: U.S. Department of Health and Human Services: www.hhs.gov Centers for Disease Control and Prevention (  CDC): www.cdc.gov Summary Exercising regularly is  important. It will improve your overall fitness, flexibility, and endurance. Regular exercise will also improve your overall health. It can help you control your weight, reduce stress, and improve your bone density. Do not exercise so much that you hurt yourself, feel dizzy, or get very short of breath. Before you start a new exercise program, talk with your health care provider. This information is not intended to replace advice given to you by your health care provider. Make sure you discuss any questions you have with your health care provider. Document Revised: 11/04/2020 Document Reviewed: 11/04/2020 Elsevier Patient Education  2024 Elsevier Inc.  

## 2023-04-12 LAB — CERVICOVAGINAL ANCILLARY ONLY
Bacterial Vaginitis (gardnerella): POSITIVE — AB
Candida Glabrata: NEGATIVE
Candida Vaginitis: NEGATIVE
Chlamydia: NEGATIVE
Comment: NEGATIVE
Comment: NEGATIVE
Comment: NEGATIVE
Comment: NEGATIVE
Comment: NEGATIVE
Comment: NORMAL
Neisseria Gonorrhea: NEGATIVE
Trichomonas: NEGATIVE

## 2023-04-14 LAB — URINE CULTURE

## 2023-04-15 ENCOUNTER — Other Ambulatory Visit: Payer: Self-pay | Admitting: Family Medicine

## 2023-04-15 ENCOUNTER — Ambulatory Visit: Payer: Medicare HMO | Attending: Family Medicine

## 2023-04-15 DIAGNOSIS — E1169 Type 2 diabetes mellitus with other specified complication: Secondary | ICD-10-CM | POA: Diagnosis not present

## 2023-04-15 DIAGNOSIS — E559 Vitamin D deficiency, unspecified: Secondary | ICD-10-CM

## 2023-04-15 MED ORDER — NITROFURANTOIN MONOHYD MACRO 100 MG PO CAPS: 100.0000 mg | ORAL_CAPSULE | Freq: Two times a day (BID) | ORAL | 0 refills | Status: DC

## 2023-04-15 MED ORDER — METRONIDAZOLE 0.75 % VA GEL: 1.0000 | Freq: Every day | VAGINAL | 0 refills | Status: AC

## 2023-04-16 LAB — CMP14+EGFR
ALT: 32 IU/L (ref 0–32)
AST: 24 IU/L (ref 0–40)
Albumin: 4.2 g/dL (ref 3.9–4.9)
Alkaline Phosphatase: 99 IU/L (ref 44–121)
BUN/Creatinine Ratio: 8 — ABNORMAL LOW (ref 9–23)
BUN: 6 mg/dL (ref 6–24)
Bilirubin Total: 0.4 mg/dL (ref 0.0–1.2)
CO2: 25 mmol/L (ref 20–29)
Calcium: 9.4 mg/dL (ref 8.7–10.2)
Chloride: 102 mmol/L (ref 96–106)
Creatinine, Ser: 0.71 mg/dL (ref 0.57–1.00)
Globulin, Total: 3.1 g/dL (ref 1.5–4.5)
Glucose: 92 mg/dL (ref 70–99)
Potassium: 4.4 mmol/L (ref 3.5–5.2)
Sodium: 142 mmol/L (ref 134–144)
Total Protein: 7.3 g/dL (ref 6.0–8.5)
eGFR: 109 mL/min/{1.73_m2} (ref >59)

## 2023-04-16 LAB — LP+NON-HDL CHOLESTEROL
Cholesterol, Total: 119 mg/dL (ref 100–199)
HDL: 50 mg/dL (ref >39)
LDL Chol Calc (NIH): 50 mg/dL (ref 0–99)
Total Non-HDL-Chol (LDL+VLDL): 69 mg/dL (ref 0–129)
Triglycerides: 105 mg/dL (ref 0–149)
VLDL Cholesterol Cal: 19 mg/dL (ref 5–40)

## 2023-04-16 LAB — MICROALBUMIN / CREATININE URINE RATIO
Creatinine, Urine: 110 mg/dL
Microalb/Creat Ratio: 12 mg/g creat (ref 0–29)
Microalbumin, Urine: 13.3 ug/mL

## 2023-04-16 LAB — VITAMIN D 25 HYDROXY (VIT D DEFICIENCY, FRACTURES): Vit D, 25-Hydroxy: 31.8 ng/mL (ref 30.0–100.0)

## 2023-04-23 ENCOUNTER — Ambulatory Visit: Payer: Self-pay

## 2023-04-23 NOTE — Patient Instructions (Signed)
Visit Information  Thank you for taking time to visit with me today. Please don't hesitate to contact me if I can be of assistance to you.   Following are the goals we discussed today:   Goals Addressed             This Visit's Progress    To establish with a new Neurologist for evalution of abnormal neurological symptoms       Care Coordination Interventions: Evaluation of current treatment plan related to abnormal neurological symptoms with drop in oxygen levels and patient's adherence to plan as established by provider Determined patient experienced at least 2 episodes of abnormal numbness/tingling to her body and noted drop in her oxygen level during this episode with unknown etiology Discussed with patient at least one of these events occurred while she was visiting her sister at [facility] in September of 2023  Determined patient has some anxiety about having these symptoms reoccur and she would like to establish with a Neurologist for further evaluation  Sent in basket message to PCP Dr. Alvis Lemmings advising her of patient's concerns and request for a Neurology referral      To work on improving A1c       Care Coordination Interventions: Provided education to patient about basic DM disease process Reviewed medications with patient and discussed importance of medication adherence Provided patient with written educational materials related to hypo and hyperglycemia and importance of correct treatment Advised patient, providing education and rationale, to check cbg daily before meals and record, calling PCP for findings outside established parameters Review of patient status, including review of consultants reports, relevant laboratory and other test results, and medications completed Mailed printed educational materials related diabetes management Lab Results  Component Value Date   HGBA1C 6.3 04/10/2023         Our next appointment is by telephone on 05/07/23  at 10:30 AM  Please call the care guide team at [phone] if you need to cancel or reschedule your appointment.   If you are experiencing a Mental Health or Behavioral Health Crisis or need someone to talk to, please call 1-800-273-TALK (toll free, 24 hour hotline)  Patient verbalizes understanding of instructions and care plan provided today and agrees to view in MyChart. Active MyChart status and patient understanding of how to access instructions and care plan via MyChart confirmed with patient.     Delsa Sale RN BSN CCM [place]  [facility], [facility] Nurse Care Coordinator  Direct Dial: [phone] Website: [email or web address]

## 2023-04-23 NOTE — Patient Outreach (Signed)
  Care Coordination   Initial Visit Note   04/23/2023 Name: Victoria Holmes MRN: [id number] DOB: [birth year]  Victoria Holmes is a 42 y.o. year old female who sees Hoy Register, MD for primary care. I spoke with  Vanessa Bryant by phone today.  What matters to the patients health and wellness today?  Patient would like to improve her diabetes. She would like to establish with a Neurologist.     Goals Addressed             This Visit's Progress    To establish with a new Neurologist for evalution of abnormal neurological symptoms       Care Coordination Interventions: Evaluation of current treatment plan related to abnormal neurological symptoms with drop in oxygen levels and patient's adherence to plan as established by provider Determined patient experienced at least 2 episodes of abnormal numbness/tingling to her body and noted drop in her oxygen level during this episode with unknown etiology Discussed with patient at least one of these events occurred while she was visiting her sister at [facility] in September of 2023  Determined patient has some anxiety about having these symptoms reoccur and she would like to establish with a Neurologist for further evaluation  Sent in basket message to PCP Dr. Alvis Lemmings advising her of patient's concerns and request for a Neurology referral      To work on improving A1c       Care Coordination Interventions: Provided education to patient about basic DM disease process Reviewed medications with patient and discussed importance of medication adherence Provided patient with written educational materials related to hypo and hyperglycemia and importance of correct treatment Advised patient, providing education and rationale, to check cbg daily before meals and record, calling PCP for findings outside established parameters Review of patient status, including review of consultants reports, relevant laboratory and other test results, and  medications completed Mailed printed educational materials related diabetes management Lab Results  Component Value Date   HGBA1C 6.3 04/10/2023     Interventions Today    Flowsheet Row Most Recent Value  Chronic Disease   Chronic disease during today's visit Diabetes, Other  [RA,  Lupus,  s/p abnormal neurological symptoms with drop in oxygen levels]  General Interventions   General Interventions Discussed/Reviewed General Interventions Discussed, General Interventions Reviewed, Labs, Doctor Visits, Communication with  Doctor Visits Discussed/Reviewed Doctor Visits Discussed, Doctor Visits Reviewed, PCP, Specialist  PCP/Specialist Visits Contact provider for referral to  [facility] provider for referral to Specialist  [Neurologist]  Communication with PCP/Specialists  [Dr. Newlin]  Exercise Interventions   Exercise Discussed/Reviewed Exercise Reviewed, Exercise Discussed, Physical Activity  Physical Activity Discussed/Reviewed Types of exercise, Physical Activity Reviewed, Physical Activity Discussed  Education Interventions   Education Provided Provided Education, Provided Printed Education  Provided Verbal Education On Labs, Blood Sugar Monitoring, Medication, Exercise, When to see the doctor, Nutrition  Labs Reviewed Hgb A1c  Mental Health Interventions   Mental Health Discussed/Reviewed Mental Health Reviewed, Mental Health Discussed  Nutrition Interventions   Nutrition Discussed/Reviewed Nutrition Reviewed, Nutrition Discussed, Carbohydrate meal planning, Portion sizes  Pharmacy Interventions   Pharmacy Dicussed/Reviewed Pharmacy Topics Discussed, Pharmacy Topics Reviewed, Medications and their functions          SDOH assessments and interventions completed:  No     Care Coordination Interventions:  Yes, provided   Follow up plan: Follow up call scheduled for 05/07/23 @10 :30 AM    Encounter Outcome:  Patient Visit Completed

## 2023-04-24 DIAGNOSIS — L648 Other androgenic alopecia: Secondary | ICD-10-CM | POA: Diagnosis not present

## 2023-04-25 ENCOUNTER — Ambulatory Visit (INDEPENDENT_AMBULATORY_CARE_PROVIDER_SITE_OTHER): Payer: Medicare HMO | Admitting: Podiatry

## 2023-04-25 ENCOUNTER — Encounter: Payer: Self-pay | Admitting: Podiatry

## 2023-04-25 ENCOUNTER — Ambulatory Visit: Payer: Medicare HMO

## 2023-04-25 DIAGNOSIS — M898X7 Other specified disorders of bone, ankle and foot: Secondary | ICD-10-CM | POA: Diagnosis not present

## 2023-04-25 DIAGNOSIS — D492 Neoplasm of unspecified behavior of bone, soft tissue, and skin: Secondary | ICD-10-CM

## 2023-04-25 DIAGNOSIS — D2372 Other benign neoplasm of skin of left lower limb, including hip: Secondary | ICD-10-CM

## 2023-04-25 DIAGNOSIS — D2371 Other benign neoplasm of skin of right lower limb, including hip: Secondary | ICD-10-CM | POA: Diagnosis not present

## 2023-04-25 NOTE — Progress Notes (Signed)
Subjective:  Patient ID: Victoria Holmes, female    DOB: 1980-10-16,  MRN: [id number] HPI Chief Complaint  Patient presents with   Toe Pain    Hallux bilateral - developed blisters in 2008 on a trip to [place] after walking a lot in flip flop, since she has had these large callused areas where the blisters used to be, had surgery on right hallux, but did not correct the problem, still tender and get very thick at times, trims herself every few weeks   New Patient (Initial Visit)    Est pt 2022    42 y.o. female presents with the above complaint.   ROS: Denies fever chills nausea vomit muscle aches pains calf pain back pain chest pain shortness of breath.  Past Medical History:  Diagnosis Date   Anemia    no current med.   Anxiety    Chronic tonsillitis 03/2012   Depression    Diabetes mellitus    diet-controlled   Fibrocystic breast disease    Headache(784.0)    migraines   History of endometriosis    Hyperlipidemia    Hypertension    under control, has been on med. x 1 yr.   Lupus    Lupus    Neuritis    Panic attacks    Raynauds disease    Rheumatoid arthritis(714.0)    Schizoaffective disorder (HCC)    Seizures (HCC)    last seizure > 1 yr. ago   Sinusitis    Sleep disturbance    Thyroid nodule    Past Surgical History:  Procedure Laterality Date   ABDOMINAL HYSTERECTOMY  6 yrs. ago   complete   CHOLECYSTECTOMY  6-8 yrs. ago   FOOT SURGERY  05/2011   right great toe   TONSILLECTOMY  04/22/2012   Procedure: TONSILLECTOMY;  Surgeon: Drema Halon, MD;  Location: [facility];  Service: ENT;  Laterality: N/A;    Current Outpatient Medications:    estradiol (ESTRACE) 0.1 MG/GM vaginal cream, 1 gram Vaginal Once a day for 2 weeks then 2 times a week 90 days for 90, Disp: , Rfl:    Accu-Chek Softclix Lancets lancets, Use to check blood sugar three times daily. E11.65, Disp: 100 each, Rfl: 3   amLODipine (NORVASC) 5 MG tablet, Take 1 tablet (5 mg  total) by mouth daily., Disp: 90 tablet, Rfl: 1   atorvastatin (LIPITOR) 20 MG tablet, Take 1 tablet (20 mg total) by mouth daily., Disp: 90 tablet, Rfl: 1   Blood Glucose Monitoring Suppl (ACCU-CHEK GUIDE ME) w/Device KIT, Use to check blood sugar three times daily. E11.65, Disp: 1 kit, Rfl: 0   busPIRone (BUSPAR) 5 MG tablet, Take 1 tablet (5 mg total) by mouth 3 (three) times daily., Disp: 90 tablet, Rfl: 2   DULoxetine (CYMBALTA) 60 MG capsule, Take 1 capsule (60 mg total) by mouth daily., Disp: 90 capsule, Rfl: 0   EPINEPHrine 0.3 mg/0.3 mL IJ SOAJ injection, Inject 0.3 mg into the muscle as needed for anaphylaxis., Disp: 1 each, Rfl: 2   glucose blood (ACCU-CHEK GUIDE) test strip, Use to check blood sugar three times daily. E11.65, Disp: 100 each, Rfl: 3   hydroxychloroquine (PLAQUENIL) 200 MG tablet, Take 200 mg by mouth 2 (two) times daily., Disp: , Rfl:    ibuprofen (ADVIL,MOTRIN) 200 MG tablet, Take 200 mg by mouth every 6 (six) hours as needed for fever, headache, mild pain, moderate pain or cramping., Disp: , Rfl:    metFORMIN (  GLUCOPHAGE) 500 MG tablet, Take 1 tablet (500 mg total) by mouth daily with breakfast., Disp: 180 tablet, Rfl: 1   nitrofurantoin, macrocrystal-monohydrate, (MACROBID) 100 MG capsule, Take 1 capsule (100 mg total) by mouth 2 (two) times daily., Disp: 10 capsule, Rfl: 0   OZEMPIC, 0.25 OR 0.5 MG/DOSE, 2 MG/3ML SOPN, Inject 0.5 mg into the skin once a week., Disp: , Rfl:    pantoprazole (PROTONIX) 40 MG tablet, Take 1 tablet (40 mg total) by mouth daily., Disp: 90 tablet, Rfl: 1   pregabalin (LYRICA) 100 MG capsule, Take 1 capsule (100 mg total) by mouth 2 (two) times daily., Disp: 60 capsule, Rfl: 3   propranolol (INDERAL) 20 MG tablet, Take 2 tablets (40 mg total) by mouth 2 (two) times daily., Disp: 360 tablet, Rfl: 1   QUEtiapine (SEROQUEL) 100 MG tablet, Take 1 tablet (100 mg total) by mouth at bedtime., Disp: 30 tablet, Rfl: 2   tiZANidine (ZANAFLEX) 4 MG  tablet, Take 1 tablet (4 mg total) by mouth every 8 (eight) hours as needed for muscle spasms., Disp: 270 tablet, Rfl: 1   valACYclovir (VALTREX) 1000 MG tablet, Take 1,000 mg by mouth daily., Disp: , Rfl:    Vitamin D, Ergocalciferol, (DRISDOL) 1.25 MG (50000 UNIT) CAPS capsule, TAKE ONE CAPSULE BY MOUTH Once weekly, Disp: 12 capsule, Rfl: 0  Allergies  Allergen Reactions   Amoxicillin Hives and Rash    Has patient had a PCN reaction causing immediate rash, facial/tongue/throat swelling, SOB or lightheadedness with hypotension: Yes Has patient had a PCN reaction causing severe rash involving mucus membranes or skin necrosis: Yes Has patient had a PCN reaction that required hospitalization: No Has patient had a PCN reaction occurring within the last 10 years: No If all of the above answers are "NO", then may proceed with Cephalosporin use.    Azithromycin Shortness Of Breath and Rash   Darvocet [Propoxyphene N-Acetaminophen] Shortness Of Breath and Rash   Grapeseed Extract [Nutritional Supplements] Shortness Of Breath, Swelling and Rash    GRAPES   Kiwi Extract Shortness Of Breath, Swelling and Rash   Latex Shortness Of Breath   Macrolides And Ketolides Shortness Of Breath and Rash   Morphine And Codeine Shortness Of Breath, Rash and Other (See Comments)    MUSCLE RIGIDITY   Nutritional Supplements Rash, Shortness Of Breath and Swelling    GRAPES   Sulfa Antibiotics Shortness Of Breath    MUSCLE RIGIDITY   Sulfamethoxazole-Trimethoprim Anaphylaxis   Klonopin [Clonazepam] Rash and Other (See Comments)    HOMICIDAL THOUGHTS   Penicillins Itching and Swelling    Swelling of mouth Has patient had a PCN reaction causing immediate rash, facial/tongue/throat swelling, SOB or lightheadedness with hypotension: Yes Has patient had a PCN reaction causing severe rash involving mucus membranes or skin necrosis: No Has patient had a PCN reaction that required hospitalization: No Has patient had a  PCN reaction occurring within the last 10 years: No If all of the above answers are "NO", then may proceed with Cephalosporin use.    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  General: Well developed, nourished, in no acute distress, alert and oriented x3   Dermatological: Skin is warm, dry and supple bilateral. Nails x 10 are well maintained; remaining integument appears unremarkable at this time. There are no open sores, no preulcerative lesions, no rash or signs of infection present.  Benign skin lesion plantar aspect of the IP joint hallux left.  She also has benign skin lesions to the plantar medial aspect of the interphalangeal joint as well this is consistent with a pinch callus are pitted callus.  Vascular: Dorsalis Pedis artery and Posterior Tibial artery pedal pulses are 2/4 bilateral with immedate capillary fill time. Pedal hair growth present. No varicosities and no lower extremity edema present bilateral.   Neruologic: Grossly intact via light touch bilateral. Vibratory intact via tuning fork bilateral. Protective threshold with Semmes Wienstein monofilament intact to all pedal sites bilateral. Patellar and Achilles deep tendon reflexes 2+ bilateral. No Babinski or clonus noted bilateral.   Musculoskeletal: No gross boney pedal deformities bilateral. No pain, crepitus, or limitation noted with foot and ankle range of motion bilateral. Muscular  strength 5/5 in all groups tested bilateral.  Hallux interphalangeal left with a interphalangeal sesamoid that is painful and resulting in a reactive hyperkeratotic benign skin lesion.  Gait: Unassisted, Nonantalgic.    Radiographs:  Radiographs taken today demonstrate an osseously mature individual bilaterally left does demonstrate an interphalangeal sesamoid to the hallux left.  Otherwise bone appears to be normal no acute findings.  Assessment & Plan:   Assessment: Benign skin lesion plantar aspect bilateral hallux interphalangeal sesamoid of the hallux left resulting in benign skin lesion  Plan: Debridement of benign skin lesions.  Discussed the need for surgery to remove the interphalangeal sesamoid she would like to consider that for the fall or winter.     Rosie Torrez T. [place], [place]

## 2023-04-26 ENCOUNTER — Telehealth: Payer: Self-pay | Admitting: Family Medicine

## 2023-04-26 NOTE — Telephone Encounter (Signed)
Yes it is okay

## 2023-04-26 NOTE — Telephone Encounter (Signed)
Pt is calling in because her Dermatologist would like to know if it's okay for pt to discontinue propranolol and start spironolactone for blood pressure. Please advise.

## 2023-04-26 NOTE — Telephone Encounter (Signed)
Routing to PCP for review.

## 2023-04-29 NOTE — Telephone Encounter (Signed)
Pt has been called and informed of PCP response

## 2023-04-30 ENCOUNTER — Ambulatory Visit: Payer: Medicare HMO | Attending: Family Medicine

## 2023-04-30 VITALS — Ht 62.0 in | Wt 176.0 lb

## 2023-04-30 DIAGNOSIS — Z0000 Encounter for general adult medical examination without abnormal findings: Secondary | ICD-10-CM

## 2023-05-01 NOTE — Progress Notes (Signed)
Subjective:   Victoria Holmes is a 42 y.o. female who presents for Medicare Annual (Subsequent) preventive examination.  Visit Complete: Virtual I connected with  Victoria Holmes on 04/30/23 by a audio enabled telemedicine application and verified that I am speaking with the correct person using two identifiers.  Patient Location: Home  Provider Location: Home Office  I discussed the limitations of evaluation and management by telemedicine. The patient expressed understanding and agreed to proceed.  Vital Signs: Because this visit was a virtual/telehealth visit, some criteria may be missing or patient reported. Any vitals not documented were not able to be obtained and vitals that have been documented are patient reported.  Cardiac Risk Factors include: diabetes mellitus;hypertension     Objective:    Today's Vitals   04/30/23 1530  Weight: 176 lb (79.8 kg)  Height: 5\' 2"  (1.575 m)   Body mass index is 32.19 kg/m.     05/01/2023    8:41 AM 06/25/2022    9:22 AM 04/19/2022   11:44 AM 04/02/2022    8:58 PM 05/31/2021   11:07 AM 03/27/2021    2:22 PM 04/18/2017    6:01 AM  Advanced Directives  Does Patient Have a Medical Advance Directive? No No No No No No No  Would patient like information on creating a medical advance directive? Yes (MAU/Ambulatory/Procedural Areas - Information given)  No - Patient declined  Yes (MAU/Ambulatory/Procedural Areas - Information given) No - Guardian declined No - Patient declined    Current Medications (verified) Outpatient Encounter Medications as of 04/30/2023  Medication Sig   Accu-Chek Softclix Lancets lancets Use to check blood sugar three times daily. E11.65   amLODipine (NORVASC) 5 MG tablet Take 1 tablet (5 mg total) by mouth daily.   atorvastatin (LIPITOR) 20 MG tablet Take 1 tablet (20 mg total) by mouth daily.   Blood Glucose Monitoring Suppl (ACCU-CHEK GUIDE ME) w/Device KIT Use to check blood sugar three times daily. E11.65   busPIRone  (BUSPAR) 5 MG tablet Take 1 tablet (5 mg total) by mouth 3 (three) times daily.   DULoxetine (CYMBALTA) 60 MG capsule Take 1 capsule (60 mg total) by mouth daily.   EPINEPHrine 0.3 mg/0.3 mL IJ SOAJ injection Inject 0.3 mg into the muscle as needed for anaphylaxis.   estradiol (ESTRACE) 0.1 MG/GM vaginal cream 1 gram Vaginal Once a day for 2 weeks then 2 times a week 90 days for 90   glucose blood (ACCU-CHEK GUIDE) test strip Use to check blood sugar three times daily. E11.65   hydroxychloroquine (PLAQUENIL) 200 MG tablet Take 200 mg by mouth 2 (two) times daily.   ibuprofen (ADVIL,MOTRIN) 200 MG tablet Take 200 mg by mouth every 6 (six) hours as needed for fever, headache, mild pain, moderate pain or cramping.   metFORMIN (GLUCOPHAGE) 500 MG tablet Take 1 tablet (500 mg total) by mouth daily with breakfast.   nitrofurantoin, macrocrystal-monohydrate, (MACROBID) 100 MG capsule Take 1 capsule (100 mg total) by mouth 2 (two) times daily.   OZEMPIC, 0.25 OR 0.5 MG/DOSE, 2 MG/3ML SOPN Inject 0.5 mg into the skin once a week.   pantoprazole (PROTONIX) 40 MG tablet Take 1 tablet (40 mg total) by mouth daily.   pregabalin (LYRICA) 100 MG capsule Take 1 capsule (100 mg total) by mouth 2 (two) times daily.   propranolol (INDERAL) 20 MG tablet Take 2 tablets (40 mg total) by mouth 2 (two) times daily.   QUEtiapine (SEROQUEL) 100 MG tablet Take 1 tablet (100  mg total) by mouth at bedtime.   tiZANidine (ZANAFLEX) 4 MG tablet Take 1 tablet (4 mg total) by mouth every 8 (eight) hours as needed for muscle spasms.   valACYclovir (VALTREX) 1000 MG tablet Take 1,000 mg by mouth daily.   Vitamin D, Ergocalciferol, (DRISDOL) 1.25 MG (50000 UNIT) CAPS capsule TAKE ONE CAPSULE BY MOUTH Once weekly   No facility-administered encounter medications on file as of 04/30/2023.    Allergies (verified) Amoxicillin, Azithromycin, Darvocet [propoxyphene n-acetaminophen], Grapeseed extract [nutritional supplements], Kiwi  extract, Latex, Macrolides and ketolides, Morphine and codeine, Nutritional supplements, Sulfa antibiotics, Sulfamethoxazole-trimethoprim, Klonopin [clonazepam], Penicillins, Zoloft [sertraline hcl], Contrast media [iodinated contrast media], Flagyl [metronidazole], Adhesive [tape], Concerta [methylphenidate], Lexapro [escitalopram oxalate], and Lortab [hydrocodone-acetaminophen]   History: Past Medical History:  Diagnosis Date   Anemia    no current med.   Anxiety    Chronic tonsillitis 03/2012   Depression    Diabetes mellitus    diet-controlled   Fibrocystic breast disease    Headache(784.0)    migraines   History of endometriosis    Hyperlipidemia    Hypertension    under control, has been on med. x 1 yr.   Lupus    Lupus    Neuritis    Panic attacks    Raynauds disease    Rheumatoid arthritis(714.0)    Schizoaffective disorder (HCC)    Seizures (HCC)    last seizure > 1 yr. ago   Sinusitis    Sleep disturbance    Thyroid nodule    Past Surgical History:  Procedure Laterality Date   ABDOMINAL HYSTERECTOMY  6 yrs. ago   complete   CHOLECYSTECTOMY  6-8 yrs. ago   FOOT SURGERY  05/2011   right great toe   TONSILLECTOMY  04/22/2012   Procedure: TONSILLECTOMY;  Surgeon: Victoria Halon, MD;  Location: [facility];  Service: ENT;  Laterality: N/A;   Family History  Problem Relation Age of Onset   Colon cancer Father    Heart attack Maternal Grandmother        late 40's   Arthritis Maternal Grandfather    Cancer Maternal Grandfather        lung   Diabetes Paternal Grandmother    Hypertension Paternal Grandmother    Kidney disease Paternal Grandmother    Lupus Other    Pancreatic cancer Neg Hx    Stomach cancer Neg Hx    Liver cancer Neg Hx    Esophageal cancer Neg Hx    Social History   Socioeconomic History   Marital status: Single    Spouse name: Not on file   Number of children: 1   Years of education: Not on file   Highest education  level: Not on file  Occupational History   Occupation: Diasbled  Tobacco Use   Smoking status: Former    Current packs/day: 0.00    Average packs/day: 0.3 packs/day for 3.0 years (0.8 ttl pk-yrs)    Types: Cigarettes    Start date: 01/2016    Quit date: 01/2019    Years since quitting: 4.2   Smokeless tobacco: Never   Tobacco comments:    3 cig./day  Vaping Use   Vaping status: Never Used  Substance and Sexual Activity   Alcohol use: Yes    Alcohol/week: 0.0 standard drinks of alcohol    Comment: occasionally   Drug use: Yes    Types: Marijuana   Sexual activity: Not on file  Other Topics Concern  Not on file  Social History Narrative   Not on file   Social Determinants of Health   Financial Resource Strain: Low Risk  (05/01/2023)   Overall Financial Resource Strain (CARDIA)    Difficulty of Paying Living Expenses: Not hard at all  Food Insecurity: No Food Insecurity (05/01/2023)   Hunger Vital Sign    Worried About Running Out of Food in the Last Year: Never true    Ran Out of Food in the Last Year: Never true  Transportation Needs: No Transportation Needs (05/01/2023)   PRAPARE - Administrator, Civil Service (Medical): No    Lack of Transportation (Non-Medical): No  Physical Activity: Inactive (05/01/2023)   Exercise Vital Sign    Days of Exercise per Week: 0 days    Minutes of Exercise per Session: 0 min  Stress: No Stress Concern Present (05/01/2023)   [employer] of Occupational Health - Occupational Stress Questionnaire    Feeling of Stress : Not at all  Social Connections: Moderately Isolated (05/01/2023)   Social Connection and Isolation Panel [NHANES]    Frequency of Communication with Friends and Family: More than three times a week    Frequency of Social Gatherings with Friends and Family: Three times a week    Attends Religious Services: 1 to 4 times per year    Active Member of Clubs or Organizations: No    Attends Banker  Meetings: Never    Marital Status: Never married    Tobacco Counseling Counseling given: Not Answered Tobacco comments: 3 cig./day   Clinical Intake:  Pre-visit preparation completed: Yes  Pain : No/denies pain  Diabetes: Yes CBG done?: No Did pt. bring in CBG monitor from home?: No  How often do you need to have someone help you when you read instructions, pamphlets, or other written materials from your doctor or pharmacy?: 1 - Never  Interpreter Needed?: No  Information entered by :: Kandis Fantasia LPN   Activities of Daily Living    05/01/2023    8:41 AM 06/25/2022    9:24 AM  In your present state of health, do you have any difficulty performing the following activities:  Hearing? 0 1  Comment  decreased hearing left ear  Vision? 0 1  Comment  vision blurry at times  Difficulty concentrating or making decisions? 0 1  Comment  due to mind races  Walking or climbing stairs? 0 1  Dressing or bathing? 0 0  Doing errands, shopping? 0 0  Preparing Food and eating ? N N  Using the Toilet? N N  In the past six months, have you accidently leaked urine? N Y  Do you have problems with loss of bowel control? N N  Managing your Medications? N N  Managing your Finances? N N  Housekeeping or managing your Housekeeping? N N    Patient Care Team: Hoy Register, MD as PCP - General (Family Medicine) Clarene Duke, Karma Lew, RN as [facility] Care Management  Indicate any recent Medical Services you may have received from other than [facility] providers in the past year (date may be approximate).     Assessment:   This is a routine wellness examination for [place].  Hearing/Vision screen Hearing Screening - Comments:: Denies hearing difficulties   Vision Screening - Comments:: Wears rx glasses - up to date with routine eye exams with [facility]    Goals Addressed   None   Depression Screen    12/04/2022  3:30 PM 10/08/2022    3:35 PM 06/25/2022    9:23 AM  06/23/2022    8:40 AM 03/30/2022    3:07 PM 01/16/2022    4:36 PM 10/10/2021    4:42 PM  PHQ 2/9 Scores  PHQ - 2 Score  2 6   5 4   PHQ- 9 Score  11 14   16 17      Information is confidential and restricted. Go to Review Flowsheets to unlock data.    Fall Risk    05/01/2023    8:36 AM 10/08/2022    3:33 PM 06/25/2022    9:22 AM 05/08/2022    9:35 AM 01/16/2022    4:36 PM  Fall Risk   Falls in the past year? 0 0 1 0 0  Comment   legs gave out    Number falls in past yr: 0 0 1 0 0  Injury with Fall? 0 0 0 0 0  Risk for fall due to : No Fall Risks  Medication side effect No Fall Risks   Follow up Falls prevention discussed;Education provided;Falls evaluation completed  Falls prevention discussed;Education provided;Falls evaluation completed      MEDICARE RISK AT HOME: Medicare Risk at Home Any stairs in or around the home?: No If so, are there any without handrails?: No Home free of loose throw rugs in walkways, pet beds, electrical cords, etc?: Yes Adequate lighting in your home to reduce risk of falls?: Yes Life alert?: No Use of a cane, walker or w/c?: No Grab bars in the bathroom?: No Shower chair or bench in shower?: No Elevated toilet seat or a handicapped toilet?: No  TIMED UP AND GO:  Was the test performed?  No    Cognitive Function:        05/01/2023    8:41 AM 06/25/2022    9:28 AM  6CIT Screen  What Year? 0 points 0 points  What month? 0 points 0 points  What time? 0 points 0 points  Count back from 20 0 points 0 points  Months in reverse 0 points 0 points  Repeat phrase 0 points 2 points  Total Score 0 points 2 points    Immunizations Immunization History  Administered Date(s) Administered   PNEUMOCOCCAL CONJUGATE-20 05/11/2021   Tdap 06/06/2020    TDAP status: Up to date  Flu Vaccine status: Declined, Education has been provided regarding the importance of this vaccine but patient still declined. Advised may receive this vaccine at local pharmacy  or Health Dept. Aware to provide a copy of the vaccination record if obtained from local pharmacy or Health Dept. Verbalized acceptance and understanding.  Pneumococcal vaccine status: Up to date  Covid-19 vaccine status: Information provided on how to obtain vaccines.   Qualifies for Shingles Vaccine? No    Screening Tests Health Maintenance  Topic Date Due   COVID-19 Vaccine (1) Never done   INFLUENZA VACCINE  10/21/2023 (Originally 02/21/2023)   OPHTHALMOLOGY EXAM  09/05/2023   HEMOGLOBIN A1C  10/08/2023   FOOT EXAM  04/09/2024   Diabetic kidney evaluation - eGFR measurement  04/14/2024   Diabetic kidney evaluation - Urine ACR  04/14/2024   Medicare Annual Wellness (AWV)  04/29/2024   Cervical Cancer Screening (HPV/Pap Cotest)  02/27/2028   DTaP/Tdap/Td (2 - Td or Tdap) 06/06/2030   Hepatitis C Screening  Completed   HIV Screening  Completed   HPV VACCINES  Aged Out    Health Maintenance  Health Maintenance Due  Topic  Date Due   COVID-19 Vaccine (1) Never done   Mammogram status:  Patient declines at this time   Lung Cancer Screening: (Low Dose CT Chest recommended if Age 67-80 years, 20 pack-year currently smoking OR have quit w/in 15years.) does not qualify.   Lung Cancer Screening Referral: n/a  Additional Screening:  Hepatitis C Screening: does qualify; Completed 10/25/21  Vision Screening: Recommended annual ophthalmology exams for early detection of glaucoma and other disorders of the eye. Is the patient up to date with their annual eye exam?  Yes  Who is the provider or what is the name of the office in which the patient attends annual eye exams? [facility] If pt is not established with a provider, would they like to be referred to a provider to establish care? No .   Dental Screening: Recommended annual dental exams for proper oral hygiene  Diabetic Foot Exam: Diabetic Foot Exam: Completed 04/10/23  Community Resource Referral / Chronic Care  Management: CRR required this visit?  No   CCM required this visit?  No     Plan:     I have personally reviewed and noted the following in the patient's chart:   Medical and social history Use of alcohol, tobacco or illicit drugs  Current medications and supplements including opioid prescriptions. Patient is not currently taking opioid prescriptions. Functional ability and status Nutritional status Physical activity Advanced directives List of other physicians Hospitalizations, surgeries, and ER visits in previous 12 months Vitals Screenings to include cognitive, depression, and falls Referrals and appointments  In addition, I have reviewed and discussed with patient certain preventive protocols, quality metrics, and best practice recommendations. A written personalized care plan for preventive services as well as general preventive health recommendations were provided to patient.     Kandis Fantasia [place], [place]   45/40/98   After Visit Summary: (MyChart) Due to this being a telephonic visit, the after visit summary with patients personalized plan was offered to patient via MyChart   Nurse Notes: Patient is asking for handicap placard application to be completed.

## 2023-05-01 NOTE — Patient Instructions (Signed)
Ms. Weyenberg , Thank you for taking time to come for your Medicare Wellness Visit. I appreciate your ongoing commitment to your health goals. Please review the following plan we discussed and let me know if I can assist you in the future.   Referrals/Orders/Follow-Ups/Clinician Recommendations: Aim for 30 minutes of exercise or brisk walking, 6-8 glasses of water, and 5 servings of fruits and vegetables each day.   This is a list of the screening recommended for you and due dates:  Health Maintenance  Topic Date Due   COVID-19 Vaccine (1) Never done   Flu Shot  10/21/2023*   Eye exam for diabetics  09/05/2023   Hemoglobin A1C  10/08/2023   Complete foot exam   04/09/2024   Yearly kidney function blood test for diabetes  04/14/2024   Yearly kidney health urinalysis for diabetes  04/14/2024   Medicare Annual Wellness Visit  04/29/2024   Pap with HPV screening  02/27/2028   DTaP/Tdap/Td vaccine (2 - Td or Tdap) 06/06/2030   Hepatitis C Screening  Completed   HIV Screening  Completed   HPV Vaccine  Aged Out  *Topic was postponed. The date shown is not the original due date.    Advanced directives: (ACP Link)Information on Advanced Care Planning can be found at [facility] [place] Advance Health Care Directives Advance Health Care Directives ([email or web address]   Next Medicare Annual Wellness Visit scheduled for next year: Yes

## 2023-05-07 ENCOUNTER — Ambulatory Visit: Payer: Self-pay

## 2023-05-07 NOTE — Patient Outreach (Signed)
  Care Coordination   05/07/2023 Name: Victoria Holmes MRN: [id number] DOB: 1981/06/15   Care Coordination Outreach Attempts:  An unsuccessful telephone outreach was attempted for a scheduled appointment today.  Follow Up Plan:  Additional outreach attempts will be made to offer the patient care coordination information and services.   Encounter Outcome:  No Answer   Care Coordination Interventions:  No, not indicated    Delsa Sale RN BSN CCM   [facility], [facility] Health Nurse Care Coordinator  Direct Dial: [phone] Website: [email or web address]

## 2023-05-13 ENCOUNTER — Telehealth: Payer: Self-pay | Admitting: *Deleted

## 2023-05-13 NOTE — Progress Notes (Signed)
  Care Coordination Note  05/13/2023 Name: Victoria Holmes MRN: [id number] DOB: 15-May-1981  Victoria Holmes is a 42 y.o. year old female who is a primary care patient of Hoy Register, MD and is actively engaged with the care management team. I reached out to Vanessa Valencia by phone today to assist with re-scheduling a follow up visit with the RN Case Manager  Follow up plan: Unsuccessful telephone outreach attempt made. A HIPAA compliant phone message was left for the patient providing contact information and requesting a return call.   [facility]  Care Coordination Care Guide  Direct Dial: [phone]

## 2023-05-14 ENCOUNTER — Other Ambulatory Visit (HOSPITAL_COMMUNITY): Payer: Self-pay | Admitting: Psychiatry

## 2023-05-14 DIAGNOSIS — F250 Schizoaffective disorder, bipolar type: Secondary | ICD-10-CM

## 2023-05-14 DIAGNOSIS — F1210 Cannabis abuse, uncomplicated: Secondary | ICD-10-CM

## 2023-05-14 DIAGNOSIS — F4312 Post-traumatic stress disorder, chronic: Secondary | ICD-10-CM

## 2023-05-20 NOTE — Progress Notes (Signed)
  Care Coordination Note  05/20/2023 Name: Victoria Holmes MRN: [id number] DOB: 30-Jun-1981  Victoria Holmes is a 42 y.o. year old female who is a primary care patient of Hoy Register, MD and is actively engaged with the care management team. I reached out to Vanessa Lancaster by phone today to assist with re-scheduling a follow up visit with the RN Case Manager  Follow up plan: Telephone appointment with care management team member scheduled for:05/30/23  [facility] Coordination Care Guide  Direct Dial: [phone]

## 2023-05-30 ENCOUNTER — Ambulatory Visit: Payer: Self-pay

## 2023-05-30 NOTE — Patient Outreach (Signed)
  Care Coordination   05/30/2023 Name: Nathalie Cavendish MRN: [id number] DOB: [birth year]   Care Coordination Outreach Attempts:  An unsuccessful telephone outreach was attempted for a scheduled appointment today.  Follow Up Plan:  Additional outreach attempts will be made to offer the patient care coordination information and services.   Encounter Outcome:  No Answer   Care Coordination Interventions:  No, not indicated    Delsa Sale RN BSN CCM [place]  [facility], [facility] Nurse Care Coordinator  Direct Dial: [phone] Website: [email or web address]

## 2023-06-14 ENCOUNTER — Telehealth (HOSPITAL_COMMUNITY): Payer: Medicare HMO | Admitting: Psychiatry

## 2023-06-18 ENCOUNTER — Telehealth (HOSPITAL_COMMUNITY): Payer: Medicare HMO | Admitting: Psychiatry

## 2023-06-28 ENCOUNTER — Other Ambulatory Visit: Payer: Self-pay | Admitting: Family Medicine

## 2023-07-01 ENCOUNTER — Other Ambulatory Visit: Payer: Self-pay | Admitting: Family Medicine

## 2023-07-01 DIAGNOSIS — M797 Fibromyalgia: Secondary | ICD-10-CM

## 2023-07-02 DIAGNOSIS — Z008 Encounter for other general examination: Secondary | ICD-10-CM | POA: Diagnosis not present

## 2023-07-30 ENCOUNTER — Other Ambulatory Visit (HOSPITAL_COMMUNITY): Payer: Self-pay | Admitting: Psychiatry

## 2023-07-30 DIAGNOSIS — F1210 Cannabis abuse, uncomplicated: Secondary | ICD-10-CM

## 2023-07-30 DIAGNOSIS — F4312 Post-traumatic stress disorder, chronic: Secondary | ICD-10-CM

## 2023-08-05 ENCOUNTER — Other Ambulatory Visit: Payer: Self-pay | Admitting: Family Medicine

## 2023-08-12 ENCOUNTER — Encounter: Payer: Self-pay | Admitting: Nurse Practitioner

## 2023-08-12 ENCOUNTER — Ambulatory Visit: Payer: Self-pay

## 2023-08-12 ENCOUNTER — Ambulatory Visit: Payer: Medicare HMO | Attending: Nurse Practitioner | Admitting: Nurse Practitioner

## 2023-08-12 ENCOUNTER — Other Ambulatory Visit (HOSPITAL_COMMUNITY)
Admission: RE | Admit: 2023-08-12 | Discharge: 2023-08-12 | Disposition: A | Discharge status: Home / Self Care | Payer: Medicare HMO | Source: Ambulatory Visit | Attending: Nurse Practitioner | Admitting: Nurse Practitioner

## 2023-08-12 VITALS — BP 122/70 | HR 82 | Resp 19 | Ht 62.0 in | Wt 184.8 lb

## 2023-08-12 DIAGNOSIS — R399 Unspecified symptoms and signs involving the genitourinary system: Secondary | ICD-10-CM | POA: Diagnosis not present

## 2023-08-12 DIAGNOSIS — N760 Acute vaginitis: Secondary | ICD-10-CM | POA: Diagnosis not present

## 2023-08-12 DIAGNOSIS — B3731 Acute candidiasis of vulva and vagina: Secondary | ICD-10-CM | POA: Diagnosis not present

## 2023-08-12 DIAGNOSIS — B9689 Other specified bacterial agents as the cause of diseases classified elsewhere: Secondary | ICD-10-CM | POA: Diagnosis not present

## 2023-08-12 LAB — POCT URINALYSIS DIP (CLINITEK)
Bilirubin, UA: NEGATIVE
Blood, UA: NEGATIVE
Glucose, UA: NEGATIVE mg/dL
Ketones, POC UA: NEGATIVE mg/dL
Leukocytes, UA: NEGATIVE
Nitrite, UA: POSITIVE — AB
POC PROTEIN,UA: NEGATIVE
Spec Grav, UA: 1.015 (ref 1.010–1.025)
Urobilinogen, UA: 1 U/dL
pH, UA: 6 (ref 5.0–8.0)

## 2023-08-12 MED ORDER — NITROFURANTOIN MONOHYD MACRO 100 MG PO CAPS: 100.0000 mg | ORAL_CAPSULE | Freq: Two times a day (BID) | ORAL | 0 refills | Status: AC

## 2023-08-12 NOTE — Progress Notes (Signed)
Discomfort when urinating.  Cloudy urine and strong smell.

## 2023-08-12 NOTE — Progress Notes (Signed)
Assessment & Plan:  Victoria Holmes was seen today for possible uti.  Diagnoses and all orders for this visit:  UTI symptoms -     Cervicovaginal ancillary only -     POCT URINALYSIS DIP (CLINITEK) -     nitrofurantoin, macrocrystal-monohydrate, (MACROBID) 100 MG capsule; Take 1 capsule (100 mg total) by mouth 2 (two) times daily for 5 days.    Patient has been counseled on age-appropriate routine health concerns for screening and prevention. These are reviewed and up-to-date. Referrals have been placed accordingly. Immunizations are up-to-date or declined.    Subjective:   Chief Complaint  Patient presents with  . Possible UTI    Victoria Holmes 43 y.o. female presents to office today for UTI symptoms.   She is a patient of Dr. Alvis Lemmings.   GU symptoms  She is currently experiencing back pain, dysuria, cloudy and malodorous urine. She does not endorse fever, urgency or hesitancy.  She also endorses stress incontinence when she coughs.    Review of Systems  Constitutional:  Negative for fever, malaise/fatigue and weight loss.  HENT: Negative.  Negative for nosebleeds.   Eyes: Negative.  Negative for blurred vision, double vision and photophobia.  Respiratory: Negative.  Negative for cough and shortness of breath.   Cardiovascular: Negative.  Negative for chest pain, palpitations and leg swelling.  Gastrointestinal: Negative.  Negative for heartburn, nausea and vomiting.  Genitourinary:  Positive for dysuria. Negative for flank pain, frequency, hematuria and urgency.       SEE HPI  Musculoskeletal: Negative.  Negative for myalgias.  Neurological: Negative.  Negative for dizziness, focal weakness, seizures and headaches.  Psychiatric/Behavioral: Negative.  Negative for suicidal ideas.     Past Medical History:  Diagnosis Date  . Anemia    no current med.  . Anxiety   . Chronic tonsillitis 03/2012  . Depression   . Diabetes mellitus    diet-controlled  . Fibrocystic breast disease    . Headache(784.0)    migraines  . History of endometriosis   . Hyperlipidemia   . Hypertension    under control, has been on med. x 1 yr.  . Lupus   . Lupus   . Neuritis   . Panic attacks   . Raynauds disease   . Rheumatoid arthritis(714.0)   . Schizoaffective disorder (HCC)   . Seizures (HCC)    last seizure > 1 yr. ago  . Sinusitis   . Sleep disturbance   . Thyroid nodule     Past Surgical History:  Procedure Laterality Date  . ABDOMINAL HYSTERECTOMY  6 yrs. ago   complete  . CHOLECYSTECTOMY  6-8 yrs. ago  . FOOT SURGERY  05/2011   right great toe  . TONSILLECTOMY  04/22/2012   Procedure: TONSILLECTOMY;  Surgeon: Drema Halon, MD;  Location: [facility];  Service: ENT;  Laterality: N/A;    Family History  Problem Relation Age of Onset  . Colon cancer Father   . Heart attack Maternal Grandmother        late 27's  . Arthritis Maternal Grandfather   . Cancer Maternal Grandfather        lung  . Diabetes Paternal Grandmother   . Hypertension Paternal Grandmother   . Kidney disease Paternal Grandmother   . Lupus Other   . Pancreatic cancer Neg Hx   . Stomach cancer Neg Hx   . Liver cancer Neg Hx   . Esophageal cancer Neg Hx  Social History Reviewed with no changes to be made today.   Outpatient Medications Prior to Visit  Medication Sig Dispense Refill  . Accu-Chek Softclix Lancets lancets Use to check blood sugar three times daily. E11.65 100 each 3  . amLODipine (NORVASC) 5 MG tablet Take 1 tablet (5 mg total) by mouth daily. 90 tablet 1  . atorvastatin (LIPITOR) 20 MG tablet Take 1 tablet (20 mg total) by mouth daily. 90 tablet 1  . Blood Glucose Monitoring Suppl (ACCU-CHEK GUIDE ME) w/Device KIT Use to check blood sugar three times daily. E11.65 1 kit 0  . DULoxetine (CYMBALTA) 60 MG capsule Take 1 capsule (60 mg total) by mouth daily. 90 capsule 0  . EPINEPHRINE 0.3 mg/0.3 mL IJ SOAJ injection Inject 0.3 mg into the muscle as  needed for anaphylaxis. 2 each 2  . estradiol (ESTRACE) 0.1 MG/GM vaginal cream 1 gram Vaginal Once a day for 2 weeks then 2 times a week 90 days for 90    . glucose blood (ACCU-CHEK GUIDE) test strip Use to check blood sugar three times daily. E11.65 100 each 3  . ibuprofen (ADVIL,MOTRIN) 200 MG tablet Take 200 mg by mouth every 6 (six) hours as needed for fever, headache, mild pain, moderate pain or cramping.    . metFORMIN (GLUCOPHAGE) 500 MG tablet Take 1 tablet (500 mg total) by mouth daily with breakfast. 180 tablet 1  . nitrofurantoin, macrocrystal-monohydrate, (MACROBID) 100 MG capsule Take 1 capsule (100 mg total) by mouth 2 (two) times daily. 10 capsule 0  . OZEMPIC, 0.25 OR 0.5 MG/DOSE, 2 MG/3ML SOPN Inject 0.5 mg into the skin once a week.    . pantoprazole (PROTONIX) 40 MG tablet Take 1 tablet (40 mg total) by mouth daily. 90 tablet 0  . pregabalin (LYRICA) 100 MG capsule Take 1 capsule (100 mg total) by mouth 2 (two) times daily. 60 capsule 3  . spironolactone (ALDACTONE) 50 MG tablet Take 50 mg by mouth daily.    Marland Kitchen tiZANidine (ZANAFLEX) 4 MG tablet TAKE ONE TABLET BY MOUTH EVERY EIGHT HOURS AS NEEDED muscle SPASMS 270 tablet 0  . valACYclovir (VALTREX) 1000 MG tablet Take 1,000 mg by mouth daily.    . Vitamin D, Ergocalciferol, (DRISDOL) 1.25 MG (50000 UNIT) CAPS capsule TAKE ONE CAPSULE BY MOUTH Once weekly 12 capsule 0  . hydroxychloroquine (PLAQUENIL) 200 MG tablet Take 200 mg by mouth 2 (two) times daily. (Patient not taking: Reported on 08/12/2023)    . propranolol (INDERAL) 20 MG tablet Take 2 tablets (40 mg total) by mouth 2 (two) times daily. (Patient not taking: Reported on 08/12/2023) 360 tablet 1  . QUEtiapine (SEROQUEL) 100 MG tablet Take 1 tablet (100 mg total) by mouth at bedtime. 30 tablet 2   No facility-administered medications prior to visit.    Allergies  Allergen Reactions  . Amoxicillin Hives and Rash    Has patient had a PCN reaction causing immediate rash,  facial/tongue/throat swelling, SOB or lightheadedness with hypotension: Yes Has patient had a PCN reaction causing severe rash involving mucus membranes or skin necrosis: Yes Has patient had a PCN reaction that required hospitalization: No Has patient had a PCN reaction occurring within the last 10 years: No If all of the above answers are "NO", then may proceed with Cephalosporin use.   . Azithromycin Shortness Of Breath and Rash  . Darvocet [Propoxyphene N-Acetaminophen] Shortness Of Breath and Rash  . Grapeseed Extract [Nutritional Supplements] Shortness Of Breath, Swelling and Rash  GRAPES  . Kiwi Extract Shortness Of Breath, Swelling and Rash  . Latex Shortness Of Breath  . Macrolides And Ketolides Shortness Of Breath and Rash  . Morphine And Codeine Shortness Of Breath, Rash and Other (See Comments)    MUSCLE RIGIDITY  . Nutritional Supplements Rash, Shortness Of Breath and Swelling    GRAPES  . Sulfa Antibiotics Shortness Of Breath    MUSCLE RIGIDITY  . Sulfamethoxazole-Trimethoprim Anaphylaxis  . Klonopin [Clonazepam] Rash and Other (See Comments)    HOMICIDAL THOUGHTS  . Penicillins Itching and Swelling    Swelling of mouth Has patient had a PCN reaction causing immediate rash, facial/tongue/throat swelling, SOB or lightheadedness with hypotension: Yes Has patient had a PCN reaction causing severe rash involving mucus membranes or skin necrosis: No Has patient had a PCN reaction that required hospitalization: No Has patient had a PCN reaction occurring within the last 10 years: No If all of the above answers are "NO", then may proceed with Cephalosporin use.   Marland Kitchen Zoloft [Sertraline Hcl] Other (See Comments)    TARDIVE DYSKINESIA  . 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 Flagyl  [Metronidazole] Nausea And Vomiting  . Adhesive [Tape] Rash  . Concerta [Methylphenidate] Nausea Only  . Lexapro [Escitalopram Oxalate] Diarrhea and Rash  . Lortab [Hydrocodone-Acetaminophen] Rash       Objective:    BP 122/70 (BP Location: Right Arm, Patient Position: Sitting, Cuff Size: Large)   Pulse 82   Resp 19   Ht 5\' 2"  (1.575 m)   Wt 184 lb 12.8 oz (83.8 kg)   SpO2 96%   BMI 33.80 kg/m  Wt Readings from Last 3 Encounters:  08/12/23 184 lb 12.8 oz (83.8 kg)  04/30/23 176 lb (79.8 kg)  04/10/23 176 lb 9.6 oz (80.1 kg)    Physical Exam Vitals and nursing note reviewed.  Constitutional:      Appearance: She is well-developed.  HENT:     Head: Normocephalic and atraumatic.  Cardiovascular:     Rate and Rhythm: Normal rate and regular rhythm.     Heart sounds: Normal heart sounds. No murmur heard.    No friction rub. No gallop.  Pulmonary:     Effort: Pulmonary effort is normal. No tachypnea or respiratory distress.     Breath sounds: Normal breath sounds. No decreased breath sounds, wheezing, rhonchi or rales.  Chest:     Chest wall: No tenderness.  Abdominal:     General: Bowel sounds are normal.     Palpations: Abdomen is soft.  Musculoskeletal:        General: Normal range of motion.     Cervical back: Normal range of motion.  Skin:    General: Skin is warm and dry.  Neurological:     Mental Status: She is alert and oriented to person, place, and time.     Coordination: Coordination normal.  Psychiatric:        Behavior: Behavior normal. Behavior is cooperative.        Thought Content: Thought content normal.        Judgment: Judgment normal.         Patient has been counseled extensively about nutrition and exercise as well as the importance of adherence with medications and regular follow-up. The patient was given  clear instructions to go to ER or return to medical center if symptoms don't improve, worsen or new problems develop. The patient  verbalized understanding.   Follow-up: Return if symptoms worsen or fail to improve.   Claiborne Rigg, FNP-BC [facility] [place], [place] [phone]   08/12/2023, 3:45 PM

## 2023-08-12 NOTE — Telephone Encounter (Signed)
  Chief Complaint: UTI Symptoms: frequency, dysuria, cloudiness, odor, urinary pain Frequency: 1 month or more Pertinent Negatives: NA Disposition: [] ED /[] Urgent Care (no appt availability in office) / [x] Appointment(In office/virtual)/ []  [place] [facility]/ [] Home Care/ [] Refused Recommended Disposition /[] [facility]/ []  Follow-up with PCP Additional Notes: pt states she has tried several OTC and nothing is working. Ex AZO cranberry tabs, cranberry juice, OTC UTI tx. Pt states she is drinking more than enough water. Scheduled OV today at 310 with Zelda, NP. Pt wanting to make sure that both insurance on file because she received a bill that Medicaid should have covered. Advised pt to call Billing and provided # to call.   ummary: poss UTI   Pt states her urine is cloudy and has a foul smell. Also nauseous. Pt states she needs appt asap or her prescription sent in now..  She says she was seen in Sept, so maybe she doesn't need to come in.  Pt states she is very uncomfortable and needs attention asap.  Pt states she has had this for over a month, and trying to treat on her own.  She spoke w/ Care Coordinator a few min ago who encouraged her to make an urgent call to her dr.     Jaquita Rector for Disposition  Urinating more frequently than usual (i.e., frequency)  Answer Assessment - Initial Assessment Questions 1. SYMPTOM: "What's the main symptom you're concerned about?" (e.g., frequency, incontinence)     Frequency  2. ONSET: "When did the  sx  start?"     1 month  3. PAIN: "Is there any pain?" If Yes, ask: "How bad is it?" (Scale: 1-10; mild, moderate, severe)     Yes  4. CAUSE: "What do you think is causing the symptoms?"     Possible UTI  5. OTHER SYMPTOMS: "Do you have any other symptoms?" (e.g., blood in urine, fever, flank pain, pain with urination)     Dysuria, cloudiness, and odor.  Protocols used: Urinary Symptoms-A-AH

## 2023-08-12 NOTE — Patient Instructions (Signed)
Visit Information  Thank you for taking time to visit with me today. Please don't hesitate to contact me if I can be of assistance to you.   Following are the goals we discussed today:   Goals Addressed             This Visit's Progress    To establish with a new Neurologist for evalution of abnormal neurological symptoms   On track    Care Coordination Interventions: Evaluation of current treatment plan related to abnormal neurological symptoms with drop in oxygen levels and patient's adherence to plan as established by provider Discussed with patient Dr. Alvis Lemmings advised patient may complete a face to face visit to discuss her request to re-establish with Neurology Reviewed and discussed with patient her next scheduled follow up with Dr. Alvis Lemmings set for 10/08/23 @3 :10 PM     To get checked for a UTI       Care Coordination Interventions: Evaluation of current treatment plan related to symptoms suggestive UTI  and patient's adherence to plan as established by provider Discussed with patient she is experiencing symptoms suggestive of a UTI Reviewed past recorded UTI and Rx for Macrobid noted in 9/24 Instructed patient to notify her doctor promptly of her current symptoms and request an office visit for a urine check in order to receive early treatment  Educated patient on the importance of increasing her daily water intake to 48-64 oz and patient verbalizes understanding        To work on improving A1c   On track    Care Coordination Interventions: Provided education to patient about basic DM disease process Reviewed medications with patient and discussed importance of medication adherence Provided patient with written educational materials related to hypo and hyperglycemia and importance of correct treatment Advised patient, providing education and rationale, to check cbg daily before meals and record, calling PCP for findings outside established parameters Review of patient status,  including review of consultants reports, relevant laboratory and other test results, and medications completed Counseled on Diabetic diet, my plate method, 161 minutes of moderate intensity exercise/week  Lab Results  Component Value Date   HGBA1C 6.3 04/10/2023          Our next appointment is by telephone on 10/10/23 at 11:30 AM  Please call the care guide team at [phone] if you need to cancel or reschedule your appointment.   If you are experiencing a Mental Health or Behavioral Health Crisis or need someone to talk to, please call 1-800-273-TALK (toll free, 24 hour hotline)  Patient verbalizes understanding of instructions and care plan provided today and agrees to view in MyChart. Active MyChart status and patient understanding of how to access instructions and care plan via MyChart confirmed with patient.     Delsa Sale RN BSN CCM [place]  [facility], [facility] Nurse Care Coordinator  Direct Dial: [phone] Website: [email or web address]

## 2023-08-12 NOTE — Patient Outreach (Signed)
Care Coordination   Follow Up Visit Note   08/12/2023 Name: Victoria Holmes MRN: [id number] DOB: [birth year]  Victoria Holmes is a 43 y.o. year old female who sees Hoy Register, MD for primary care. I spoke with  Vanessa  by phone today.  What matters to the patients health and wellness today?  Patient would like to get checked for a UTI.    Goals Addressed             This Visit's Progress    To establish with a new Neurologist for evalution of abnormal neurological symptoms   On track    Care Coordination Interventions: Evaluation of current treatment plan related to abnormal neurological symptoms with drop in oxygen levels and patient's adherence to plan as established by provider Discussed with patient Dr. Alvis Lemmings advised patient may complete a face to face visit to discuss her request to re-establish with Neurology Reviewed and discussed with patient her next scheduled follow up with Dr. Alvis Lemmings set for 10/08/23 @3 :10 PM     To get checked for a UTI       Care Coordination Interventions: Evaluation of current treatment plan related to symptoms suggestive UTI  and patient's adherence to plan as established by provider Discussed with patient she is experiencing symptoms suggestive of a UTI Reviewed past recorded UTI and Rx for Macrobid noted in 9/24 Instructed patient to notify her doctor promptly of her current symptoms and request an office visit for a urine check in order to receive early treatment  Educated patient on the importance of increasing her daily water intake to 48-64 oz and patient verbalizes understanding      To work on improving A1c   On track    Care Coordination Interventions: Provided education to patient about basic DM disease process Reviewed medications with patient and discussed importance of medication adherence Provided patient with written educational materials related to hypo and hyperglycemia and importance of correct treatment Advised patient,  providing education and rationale, to check cbg daily before meals and record, calling PCP for findings outside established parameters Review of patient status, including review of consultants reports, relevant laboratory and other test results, and medications completed Counseled on Diabetic diet, my plate method, 387 minutes of moderate intensity exercise/week  Lab Results  Component Value Date   HGBA1C 6.3 04/10/2023     Interventions Today    Flowsheet Row Most Recent Value  Chronic Disease   Chronic disease during today's visit Diabetes, Other  [symptoms suggestive of UTI]  General Interventions   General Interventions Discussed/Reviewed General Interventions Discussed, General Interventions Reviewed, Labs, Doctor Visits, Durable Medical Equipment (DME)  Doctor Visits Discussed/Reviewed Doctor Visits Discussed, Doctor Visits Reviewed, PCP, Specialist  Durable Medical Equipment (DME) Glucomoter  Education Interventions   Education Provided Provided Education  Provided Verbal Education On When to see the doctor, Nutrition, Blood Sugar Monitoring, Medication, Labs  Labs Reviewed Hgb A1c  Nutrition Interventions   Nutrition Discussed/Reviewed Nutrition Discussed, Nutrition Reviewed, Carbohydrate meal planning, Portion sizes  Pharmacy Interventions   Pharmacy Dicussed/Reviewed Pharmacy Topics Discussed, Pharmacy Topics Reviewed, Medications and their functions          SDOH assessments and interventions completed:  Yes  SDOH Interventions Today    Flowsheet Row Most Recent Value  SDOH Interventions   Food Insecurity Interventions Intervention Not Indicated  Housing Interventions Intervention Not Indicated  Transportation Interventions Intervention Not Indicated  Utilities Interventions Intervention Not Indicated  Physical Activity Interventions Intervention Not Indicated  Care Coordination Interventions:  Yes, provided   Follow up plan: Follow up call scheduled  for 10/10/23 @11 :30 AM    Encounter Outcome:  Patient Visit Completed

## 2023-08-13 LAB — CERVICOVAGINAL ANCILLARY ONLY
Bacterial Vaginitis (gardnerella): POSITIVE — AB
Candida Glabrata: NEGATIVE
Candida Vaginitis: POSITIVE — AB
Chlamydia: NEGATIVE
Comment: NEGATIVE
Comment: NEGATIVE
Comment: NEGATIVE
Comment: NEGATIVE
Comment: NEGATIVE
Comment: NORMAL
Neisseria Gonorrhea: NEGATIVE
Trichomonas: NEGATIVE

## 2023-08-14 ENCOUNTER — Other Ambulatory Visit: Payer: Self-pay | Admitting: Nurse Practitioner

## 2023-08-14 ENCOUNTER — Encounter: Payer: Self-pay | Admitting: Nurse Practitioner

## 2023-08-14 DIAGNOSIS — B3731 Acute candidiasis of vulva and vagina: Secondary | ICD-10-CM

## 2023-08-14 DIAGNOSIS — B9689 Other specified bacterial agents as the cause of diseases classified elsewhere: Secondary | ICD-10-CM

## 2023-08-14 MED ORDER — METRONIDAZOLE 0.75 % VA GEL: 1.0000 | Freq: Two times a day (BID) | VAGINAL | 0 refills | Status: DC

## 2023-08-14 MED ORDER — FLUCONAZOLE 150 MG PO TABS: 150.0000 mg | ORAL_TABLET | Freq: Once | ORAL | 0 refills | Status: AC

## 2023-08-15 ENCOUNTER — Other Ambulatory Visit: Payer: Self-pay | Admitting: Family Medicine

## 2023-08-15 DIAGNOSIS — M069 Rheumatoid arthritis, unspecified: Secondary | ICD-10-CM

## 2023-08-15 DIAGNOSIS — M797 Fibromyalgia: Secondary | ICD-10-CM

## 2023-08-15 NOTE — Telephone Encounter (Signed)
Requested Prescriptions  Pending Prescriptions Disp Refills   DULoxetine (CYMBALTA) 60 MG capsule [Pharmacy Med Name: duloxetine 60 mg capsule,delayed release] 90 capsule 0    Sig: TAKE ONE CAPSULE BY MOUTH EVERY DAY     Psychiatry: Antidepressants - SNRI - duloxetine Passed - 08/15/2023  1:16 PM      Passed - Cr in normal range and within 360 days    Creatinine, Ser  Date Value Ref Range Status  04/15/2023 0.71 0.57 - 1.00 mg/dL Final         Passed - eGFR is 30 or above and within 360 days    GFR calc Af Amer  Date Value Ref Range Status  03/17/2019 >60 >60 mL/min Final   GFR, Estimated  Date Value Ref Range Status  04/19/2022 >60 >60 mL/min Final    Comment:    (NOTE) Calculated using the CKD-EPI Creatinine Equation (2021)    GFR  Date Value Ref Range Status  02/01/2015 110.19 >60.00 mL/min Final   eGFR  Date Value Ref Range Status  04/15/2023 109 >59 mL/min/1.73 Final         Passed - Completed PHQ-2 or PHQ-9 in the last 360 days      Passed - Last BP in normal range    BP Readings from Last 1 Encounters:  08/12/23 122/70         Passed - Valid encounter within last 6 months    Recent Outpatient Visits           3 days ago UTI symptoms   [facility] [facility] [place] [facility] [place]. [facility] [place], [place] W, NP   4 months ago Type 2 diabetes mellitus with other specified complication, without long-term current use of insulin (HCC)   [place] [facility] [place] [facility] Of [place]. [facility] Hoy Register, MD   10 months ago Type 2 diabetes mellitus with other specified complication, without long-term current use of insulin (HCC)   [place] [facility] [place] [facility] Dept Of [place]. [facility] Hoy Register, MD   1 year ago Type 2 diabetes mellitus with other specified complication, without long-term current use of insulin (HCC)   [place] [facility] [place] - [facility] Dept Of [place]. [facility]  Hoy Register, MD   1 year ago Type 2 diabetes mellitus with other specified complication, without long-term current use of insulin (HCC)   [place] [facility] [place] - A Dept Of [place]. [facility] Hoy Register, MD       Future Appointments             In 1 month Hoy Register, MD [facility] [place] - A Dept Of [place]. [facility]

## 2023-08-26 ENCOUNTER — Other Ambulatory Visit: Payer: Self-pay | Admitting: Family Medicine

## 2023-08-26 DIAGNOSIS — M069 Rheumatoid arthritis, unspecified: Secondary | ICD-10-CM

## 2023-08-26 DIAGNOSIS — M797 Fibromyalgia: Secondary | ICD-10-CM

## 2023-08-27 ENCOUNTER — Other Ambulatory Visit: Payer: Self-pay | Admitting: Family Medicine

## 2023-08-27 DIAGNOSIS — R202 Paresthesia of skin: Secondary | ICD-10-CM

## 2023-08-27 NOTE — Telephone Encounter (Signed)
 Requested medication (s) are due for refill today: documented last filled 08/27/23  Requested medication (s) are on the active medication list: yes   Last refill:  04/10/23 #60 3 refills  Future visit scheduled: yes in 1 month  Notes to clinic:  not delegated per protocol. Documented last filled 08/27/23. Do you want to refill Rx?     Requested Prescriptions  Pending Prescriptions Disp Refills   pregabalin  (LYRICA ) 100 MG capsule [Pharmacy Med Name: pregabalin  100 mg capsule] 60 capsule 3    Sig: Take 1 capsule (100 mg total) by mouth 2 (two) times daily.     Not Delegated - Neurology:  Anticonvulsants - Controlled - pregabalin  Failed - 08/27/2023  3:24 PM      Failed - This refill cannot be delegated      Passed - Cr in normal range and within 360 days    Creatinine, Ser  Date Value Ref Range Status  04/15/2023 0.71 0.57 - 1.00 mg/dL Final         Passed - Completed PHQ-2 or PHQ-9 in the last 360 days      Passed - Valid encounter within last 12 months    Recent Outpatient Visits           2 weeks ago UTI symptoms   [place] [facility] [place] [facility] [place]. [facility] [place], [place] W, NP   4 months ago Type 2 diabetes mellitus with other specified complication, without long-term current use of insulin (HCC)   [place] [facility] [place] [facility] Of [place]. [facility] Delbert Clam, MD   10 months ago Type 2 diabetes mellitus with other specified complication, without long-term current use of insulin (HCC)   [place] [facility] [place] - [facility] Dept Of [place]. [facility] Delbert Clam, MD   1 year ago Type 2 diabetes mellitus with other specified complication, without long-term current use of insulin (HCC)   [place] [facility] [place] - [facility] Dept Of [place]. [facility] Delbert Clam, MD   1 year ago Type 2 diabetes mellitus with other specified complication, without long-term current use of insulin (HCC)    [place] [facility] [place] - [facility] Dept Of [place]. [facility] Delbert Clam, MD       Future Appointments             In 1 month Delbert Clam, MD [facility] [place] - A Dept Of [place]. [facility]

## 2023-09-12 ENCOUNTER — Other Ambulatory Visit (HOSPITAL_COMMUNITY): Payer: Self-pay | Admitting: Psychiatry

## 2023-09-12 DIAGNOSIS — F250 Schizoaffective disorder, bipolar type: Secondary | ICD-10-CM

## 2023-09-12 DIAGNOSIS — F4312 Post-traumatic stress disorder, chronic: Secondary | ICD-10-CM

## 2023-09-12 DIAGNOSIS — F1210 Cannabis abuse, uncomplicated: Secondary | ICD-10-CM

## 2023-09-13 ENCOUNTER — Telehealth: Payer: Self-pay | Admitting: Family Medicine

## 2023-09-13 NOTE — Telephone Encounter (Addendum)
Contacted pt to resch her appt on march 18 due to office closing at 3pm.. resch her 2/19 at 9:10am.. she also request another refill on antibiotics.

## 2023-09-13 NOTE — Telephone Encounter (Signed)
Patient was called and asked which medication she is needing a refill on. Patient states that she needs medication for UTI, I informed her that she will need to have an office visit to discuss. Pt states that she is currently out of town.

## 2023-09-18 ENCOUNTER — Telehealth (HOSPITAL_COMMUNITY): Payer: Medicare HMO | Admitting: Psychiatry

## 2023-09-26 ENCOUNTER — Telehealth (HOSPITAL_BASED_OUTPATIENT_CLINIC_OR_DEPARTMENT_OTHER): Admitting: Psychiatry

## 2023-09-26 ENCOUNTER — Encounter (HOSPITAL_COMMUNITY): Payer: Self-pay | Admitting: Psychiatry

## 2023-09-26 VITALS — Wt 184.0 lb

## 2023-09-26 DIAGNOSIS — F250 Schizoaffective disorder, bipolar type: Secondary | ICD-10-CM | POA: Diagnosis not present

## 2023-09-26 DIAGNOSIS — F4312 Post-traumatic stress disorder, chronic: Secondary | ICD-10-CM

## 2023-09-26 DIAGNOSIS — F1210 Cannabis abuse, uncomplicated: Secondary | ICD-10-CM

## 2023-09-26 MED ORDER — QUETIAPINE FUMARATE 150 MG PO TABS: 150.0000 mg | ORAL_TABLET | Freq: Every day | ORAL | 1 refills | Status: DC

## 2023-09-26 MED ORDER — BUSPIRONE HCL 5 MG PO TABS: 5.0000 mg | ORAL_TABLET | Freq: Three times a day (TID) | ORAL | 1 refills | Status: DC

## 2023-09-26 NOTE — Progress Notes (Signed)
 [facility] MD Virtual Progress Note   Patient Location: Home Provider Location: Office  I connect with patient by video and verified that I am speaking with correct person by using two identifiers. I discussed the limitations of evaluation and management by telemedicine and the availability of in person appointments. I also discussed with the patient that there may be a patient responsible charge related to this service. The patient expressed understanding and agreed to proceed.  Victoria Holmes [id number] 43 y.o.  09/26/2023 11:40 AM  History of Present Illness:  Patient is evaluated by video session.  She was last seen in August and she missed the appointment but taking her medication.  She reported a lot of irritability, anger, mood swings and anxiety.  She is having a lot of nervousness and agitation.  She reported having family drama with her sister and niece.  Few weeks ago she recall her episode when she broke the table and punch the wall after having an argument with her family members.  She hurt her hand knuckles.  Patient told that she is trying to get in touch with family members but sometimes is very hard and difficult.  Patient told it is difficult to build trust with the family members.  Patient told holidays were very difficult as she was thinking about her nephew who she lost 4 years ago.  Patient think about her nephew she also think about her son who she lost more than 25 years ago.  She admitted some time hallucination, ruminative thoughts, flashbacks and nightmares.  She denies any suicidal thoughts or homicidal thoughts.  She admitted still smoke marijuana on and off.  She is on BuSpar and Seroquel.  She also take Cymbalta from her primary care for chronic pain.  She is on Ozempic but lately has not able to received is issue with the pharmacy and insurance.  We have offered therapy but patient did not connect with a therapist and like to get only medication for  now.  Past Psychiatric History: H/O PTSD, schizoaffective disorder, and bipolar disorder.  H/O cutting wrist and neck in 2007 and multiple inpatient.  Seroquel helped but higher doses made groggy. Tried Depakote but no details. Tried Ativan and Xanax.  Zoloft, Klonopin had side effects.  As per chart she also had tried Prozac, Concerta, Lexapro.  Has seen [facility] services of [place] and [facility].    Outpatient Encounter Medications as of 09/26/2023  Medication Sig   Accu-Chek Softclix Lancets lancets Use to check blood sugar three times daily. E11.65   amLODipine (NORVASC) 5 MG tablet Take 1 tablet (5 mg total) by mouth daily.   atorvastatin (LIPITOR) 20 MG tablet Take 1 tablet (20 mg total) by mouth daily.   Blood Glucose Monitoring Suppl (ACCU-CHEK GUIDE ME) w/Device KIT Use to check blood sugar three times daily. E11.65   DULoxetine (CYMBALTA) 60 MG capsule TAKE ONE CAPSULE BY MOUTH EVERY DAY   EPINEPHRINE 0.3 mg/0.3 mL IJ SOAJ injection Inject 0.3 mg into the muscle as needed for anaphylaxis.   estradiol (ESTRACE) 0.1 MG/GM vaginal cream 1 gram Vaginal Once a day for 2 weeks then 2 times a week 90 days for 90   glucose blood (ACCU-CHEK GUIDE) test strip Use to check blood sugar three times daily. E11.65   hydroxychloroquine (PLAQUENIL) 200 MG tablet Take 200 mg by mouth 2 (two) times daily. (Patient not taking: Reported on 08/12/2023)   ibuprofen (ADVIL,MOTRIN) 200 MG tablet Take 200 mg by mouth every 6 (  six) hours as needed for fever, headache, mild pain, moderate pain or cramping.   metFORMIN (GLUCOPHAGE) 500 MG tablet Take 1 tablet (500 mg total) by mouth daily with breakfast.   metroNIDAZOLE (METROGEL) 0.75 % vaginal gel Place 1 Applicatorful vaginally 2 (two) times daily.   nitrofurantoin, macrocrystal-monohydrate, (MACROBID) 100 MG capsule Take 1 capsule (100 mg total) by mouth 2 (two) times daily.   OZEMPIC, 0.25 OR 0.5 MG/DOSE, 2 MG/3ML SOPN Inject 0.5 mg into the skin once a week.    pantoprazole (PROTONIX) 40 MG tablet Take 1 tablet (40 mg total) by mouth daily.   pregabalin (LYRICA) 100 MG capsule Take 1 capsule (100 mg total) by mouth 2 (two) times daily.   propranolol (INDERAL) 20 MG tablet Take 2 tablets (40 mg total) by mouth 2 (two) times daily. (Patient not taking: Reported on 08/12/2023)   QUEtiapine (SEROQUEL) 100 MG tablet Take 1 tablet (100 mg total) by mouth at bedtime.   spironolactone (ALDACTONE) 50 MG tablet Take 50 mg by mouth daily.   tiZANidine (ZANAFLEX) 4 MG tablet TAKE ONE TABLET BY MOUTH EVERY EIGHT HOURS AS NEEDED muscle SPASMS   valACYclovir (VALTREX) 1000 MG tablet Take 1,000 mg by mouth daily.   Vitamin D, Ergocalciferol, (DRISDOL) 1.25 MG (50000 UNIT) CAPS capsule TAKE ONE CAPSULE BY MOUTH Once weekly   No facility-administered encounter medications on file as of 09/26/2023.    Recent Results (from the past 2160 hours)  Cervicovaginal ancillary only     Status: Abnormal   Collection Time: 08/12/23  3:31 PM  Result Value Ref Range   Neisseria Gonorrhea Negative    Chlamydia Negative    Trichomonas Negative    Bacterial Vaginitis (gardnerella) Positive (A)    Candida Vaginitis Positive (A)    Candida Glabrata Negative    Comment      Normal Reference Range Bacterial Vaginosis - Negative   Comment Normal Reference Range Candida Species - Negative    Comment Normal Reference Range Candida Galbrata - Negative    Comment Normal Reference Range Trichomonas - Negative    Comment Normal Reference Ranger Chlamydia - Negative    Comment      Normal Reference Range Neisseria Gonorrhea - Negative  POCT URINALYSIS DIP (CLINITEK)     Status: Abnormal   Collection Time: 08/12/23  3:41 PM  Result Value Ref Range   Color, UA yellow yellow   Clarity, UA cloudy (A) clear   Glucose, UA negative negative mg/dL   Bilirubin, UA negative negative   Ketones, POC UA negative negative mg/dL   Spec Grav, UA 9.604 5.409 - 1.025   Blood, UA negative negative    pH, UA 6.0 5.0 - 8.0   POC PROTEIN,UA negative negative, trace   Urobilinogen, UA 1.0 0.2 or 1.0 E.U./dL   Nitrite, UA Positive (A) Negative   Leukocytes, UA Negative Negative     Psychiatric Specialty Exam: Physical Exam  Review of Systems  Weight 184 lb (83.5 kg).There is no height or weight on file to calculate BMI.  General Appearance: Fairly Groomed  Eye Contact:  Fair  Speech:  Slow  Volume:  Decreased  Mood:  Dysphoric and Irritable  Affect:  Constricted  Thought Process:  Descriptions of Associations: Intact  Orientation:  Full (Time, Place, and Person)  Thought Content:  Paranoid Ideation and Rumination  Suicidal Thoughts:  No  Homicidal Thoughts:  No  Memory:  Immediate;   Fair Recent;   Fair Remote;   Fair  Judgement:  Fair  Insight:  Shallow  Psychomotor Activity:  Decreased  Concentration:  Concentration: Fair and Attention Span: Fair  Recall:  [employer] of Knowledge:  Fair  Language:  Good  Akathisia:  No  Handed:  Right  AIMS (if indicated):     Assets:  Communication Skills Desire for Improvement Housing  ADL's:  Intact  Cognition:  WNL  Sleep:  dreams     Assessment/Plan: Schizoaffective disorder, bipolar type (HCC) - Plan: QUEtiapine 150 MG TABS, busPIRone (BUSPAR) 5 MG tablet  Chronic post-traumatic stress disorder (PTSD) - Plan: QUEtiapine 150 MG TABS, busPIRone (BUSPAR) 5 MG tablet  Mild tetrahydrocannabinol (THC) abuse - Plan: busPIRone (BUSPAR) 5 MG tablet  Discussed family stress and episodic irritability and severe mood swings.  I reviewed blood work results and current medication.  She is seeing primary care for basic health needs.  I encouraged should consider seeing a therapist to help her coping skills but patient at this time does not want therapy.  I discussed further optimizing the medication which she agreed.  I will increase BuSpar from 5 mg twice a day to 5 mg 3 times a day and Seroquel from 100 mg at bedtime 250 mg at bedtime.   She is getting Cymbalta 60 mg from primary care.  I encourage walking, exercise, taking deep breath when she get upset.  Encourage to keep appointment with her primary care and other specialist.  Recommended to call us back if she is any question or any concern.  Follow-up in 6 weeks   Follow Up Instructions:     I discussed the assessment and treatment plan with the patient. The patient was provided an opportunity to ask questions and all were answered. The patient agreed with the plan and demonstrated an understanding of the instructions.   The patient was advised to call back or seek an in-person evaluation if the symptoms worsen or if the condition fails to improve as anticipated.    Collaboration of Care: Other provider involved in patient's care AEB notes are available in epic to review  Patient/Guardian was advised Release of Information must be obtained prior to any record release in order to collaborate their care with an outside provider. Patient/Guardian was advised if they have not already done so to contact the registration department to sign all necessary forms in order for [place] to release information regarding their care.   Consent: Patient/Guardian gives verbal consent for treatment and assignment of benefits for services provided during this visit. Patient/Guardian expressed understanding and agreed to proceed.     I provided 28 minutes of non face to face time during this encounter.  Note: This document was prepared by [employer] voice dictation technology and any errors that results from this process are unintentional.    Cleotis Nipper, MD 09/26/2023

## 2023-10-03 DIAGNOSIS — L6689 Other cicatricial alopecia: Secondary | ICD-10-CM | POA: Diagnosis not present

## 2023-10-03 DIAGNOSIS — L648 Other androgenic alopecia: Secondary | ICD-10-CM | POA: Diagnosis not present

## 2023-10-08 ENCOUNTER — Ambulatory Visit: Payer: Medicare HMO | Admitting: Family Medicine

## 2023-10-09 ENCOUNTER — Encounter: Payer: Self-pay | Admitting: Family Medicine

## 2023-10-09 ENCOUNTER — Ambulatory Visit: Payer: Medicare HMO | Attending: Family Medicine | Admitting: Family Medicine

## 2023-10-09 VITALS — BP 109/73 | HR 75 | Ht 62.0 in | Wt 186.4 lb

## 2023-10-09 DIAGNOSIS — Z7984 Long term (current) use of oral hypoglycemic drugs: Secondary | ICD-10-CM | POA: Diagnosis not present

## 2023-10-09 DIAGNOSIS — F259 Schizoaffective disorder, unspecified: Secondary | ICD-10-CM

## 2023-10-09 DIAGNOSIS — R202 Paresthesia of skin: Secondary | ICD-10-CM

## 2023-10-09 DIAGNOSIS — E1169 Type 2 diabetes mellitus with other specified complication: Secondary | ICD-10-CM

## 2023-10-09 DIAGNOSIS — R29898 Other symptoms and signs involving the musculoskeletal system: Secondary | ICD-10-CM

## 2023-10-09 DIAGNOSIS — G8929 Other chronic pain: Secondary | ICD-10-CM

## 2023-10-09 DIAGNOSIS — M545 Low back pain, unspecified: Secondary | ICD-10-CM | POA: Diagnosis not present

## 2023-10-09 DIAGNOSIS — E119 Type 2 diabetes mellitus without complications: Secondary | ICD-10-CM

## 2023-10-09 DIAGNOSIS — F250 Schizoaffective disorder, bipolar type: Secondary | ICD-10-CM

## 2023-10-09 DIAGNOSIS — M069 Rheumatoid arthritis, unspecified: Secondary | ICD-10-CM

## 2023-10-09 DIAGNOSIS — B379 Candidiasis, unspecified: Secondary | ICD-10-CM

## 2023-10-09 DIAGNOSIS — R238 Other skin changes: Secondary | ICD-10-CM | POA: Diagnosis not present

## 2023-10-09 DIAGNOSIS — Z7985 Long-term (current) use of injectable non-insulin antidiabetic drugs: Secondary | ICD-10-CM

## 2023-10-09 DIAGNOSIS — M25551 Pain in right hip: Secondary | ICD-10-CM

## 2023-10-09 DIAGNOSIS — I10 Essential (primary) hypertension: Secondary | ICD-10-CM | POA: Diagnosis not present

## 2023-10-09 DIAGNOSIS — M797 Fibromyalgia: Secondary | ICD-10-CM

## 2023-10-09 LAB — POCT GLYCOSYLATED HEMOGLOBIN (HGB A1C): HbA1c, POC (controlled diabetic range): 6.7 % (ref 0.0–7.0)

## 2023-10-09 MED ORDER — AMLODIPINE BESYLATE 5 MG PO TABS: 5.0000 mg | ORAL_TABLET | Freq: Every day | ORAL | 1 refills | Status: DC

## 2023-10-09 MED ORDER — OZEMPIC (0.25 OR 0.5 MG/DOSE) 2 MG/3ML ~~LOC~~ SOPN: 0.5000 mg | PEN_INJECTOR | SUBCUTANEOUS | 6 refills | Status: DC

## 2023-10-09 MED ORDER — ATORVASTATIN CALCIUM 20 MG PO TABS: 20.0000 mg | ORAL_TABLET | Freq: Every day | ORAL | 1 refills | Status: DC

## 2023-10-09 MED ORDER — METFORMIN HCL 500 MG PO TABS: 500.0000 mg | ORAL_TABLET | Freq: Every day | ORAL | 1 refills | Status: DC

## 2023-10-09 MED ORDER — OZEMPIC (0.25 OR 0.5 MG/DOSE) 2 MG/1.5ML ~~LOC~~ SOPN: 0.2500 mg | PEN_INJECTOR | SUBCUTANEOUS | 0 refills | Status: DC

## 2023-10-09 MED ORDER — DULOXETINE HCL 60 MG PO CPEP: 60.0000 mg | ORAL_CAPSULE | Freq: Every day | ORAL | 1 refills | Status: DC

## 2023-10-09 MED ORDER — PANTOPRAZOLE SODIUM 40 MG PO TBEC: 40.0000 mg | DELAYED_RELEASE_TABLET | Freq: Every day | ORAL | 1 refills | Status: AC

## 2023-10-09 MED ORDER — FLUCONAZOLE 150 MG PO TABS: 150.0000 mg | ORAL_TABLET | Freq: Every day | ORAL | 0 refills | Status: DC

## 2023-10-09 MED ORDER — PREGABALIN 100 MG PO CAPS: 100.0000 mg | ORAL_CAPSULE | Freq: Two times a day (BID) | ORAL | 3 refills | Status: DC

## 2023-10-09 NOTE — Progress Notes (Signed)
 Subjective:  Patient ID: Victoria Holmes, female    DOB: 1980/10/07  Age: 43 y.o. MRN: [id number]  CC: Medical Management of Chronic Issues (Legs keep giving out/Discuss ozempic/Currently taking antibiotics and needing pill for yeast infection.)     Discussed the use of AI scribe software for clinical note transcription with the patient, who gave verbal consent to proceed.  History of Present Illness The patient, with a history of HTN, type 2 diabetes mellitus (A1c 5.7), rheumatoid arthritis, lupus, Raynaud's syndrome, Schizoaffective disorder, PTSD, on Valtrex for Herpes Simplex prophylaxis, pseudotumor cerebri, H. pylori gastritis, chronic nausea and vomiting presents with multiple concerns. The patient reports recurrent yeast infections, which she believes are triggered by antibiotics. She is currently on an antibiotic regimen prescribed by her dermatologist for hair loss, in combination with spironolactone. The patient has previously used Diflucan (fluconazole) for yeast infections and requests a prescription for prevention during her antibiotic course.  The patient also discusses her diabetes management. She is currently on metformin and has previously used Ozempic. She reports variable fasting blood glucose levels, ranging from 75 to 135. She expresses interest in restarting Ozempic for both blood sugar control and weight loss.  The patient reports leg weakness, with her legs occasionally giving out. She describes knee and hip pain, as well as lower back pain. She has previously undergone physical therapy but had to pause due to transportation issues. She also reports changes in her toenails, which have started to pinch and change color.  The patient also mentions a history of calluses on her feet and has previously seen a podiatrist. She reports one toenail that will not grow back and is cracked.  She is currently under rheumatology care for management of her rheumatoid arthritis and states  her Plaquenil was discontinued by rheumatology.    Past Medical History:  Diagnosis Date   Anemia    no current med.   Anxiety    Chronic tonsillitis 03/2012   Depression    Diabetes mellitus    diet-controlled   Fibrocystic breast disease    Headache(784.0)    migraines   History of endometriosis    Hyperlipidemia    Hypertension    under control, has been on med. x 1 yr.   Lupus    Lupus    Neuritis    Panic attacks    Raynauds disease    Rheumatoid arthritis(714.0)    Schizoaffective disorder (HCC)    Seizures (HCC)    last seizure > 1 yr. ago   Sinusitis    Sleep disturbance    Thyroid nodule     Past Surgical History:  Procedure Laterality Date   ABDOMINAL HYSTERECTOMY  6 yrs. ago   complete   CHOLECYSTECTOMY  6-8 yrs. ago   FOOT SURGERY  05/2011   right great toe   TONSILLECTOMY  04/22/2012   Procedure: TONSILLECTOMY;  Surgeon: Drema Halon, MD;  Location:  [facility];  Service: ENT;  Laterality: N/A;    Family History  Problem Relation Age of Onset   Colon cancer Father    Heart attack Maternal Grandmother        late 40's   Arthritis Maternal Grandfather    Cancer Maternal Grandfather        lung   Diabetes Paternal Grandmother    Hypertension Paternal Grandmother    Kidney disease Paternal Grandmother    Lupus Other    Pancreatic cancer Neg Hx    Stomach cancer Neg Hx  Liver cancer Neg Hx    Esophageal cancer Neg Hx     Social History   Socioeconomic History   Marital status: Single    Spouse name: Not on file   Number of children: 1   Years of education: Not on file   Highest education level: Not on file  Occupational History   Occupation: Diasbled  Tobacco Use   Smoking status: Former    Current packs/day: 0.00    Average packs/day: 0.3 packs/day for 3.0 years (0.8 ttl pk-yrs)    Types: Cigarettes    Start date: 01/2016    Quit date: 01/2019    Years since quitting: 4.7   Smokeless tobacco: Never    Tobacco comments:    3 cig./day  Vaping Use   Vaping status: Never Used  Substance and Sexual Activity   Alcohol use: Yes    Alcohol/week: 0.0 standard drinks of alcohol    Comment: occasionally   Drug use: Yes    Types: Marijuana   Sexual activity: Not on file  Other Topics Concern   Not on file  Social History Narrative   Not on file   Social Drivers of Health   Financial Resource Strain: Low Risk  (08/12/2023)   Overall Financial Resource Strain (CARDIA)    Difficulty of Paying Living Expenses: Not hard at all  Food Insecurity: Patient Declined (08/12/2023)   Hunger Vital Sign    Worried About Running Out of Food in the Last Year: Patient declined    Ran Out of Food in the Last Year: Patient declined  Transportation Needs: Patient Declined (08/12/2023)   PRAPARE - Administrator, Civil Service (Medical): Patient declined    Lack of Transportation (Non-Medical): Patient declined  Physical Activity: Sufficiently Active (08/12/2023)   Exercise Vital Sign    Days of Exercise per Week: 4 days    Minutes of Exercise per Session: 40 min  Stress: Stress Concern Present (08/12/2023)   [employer] of Occupational Health - Occupational Stress Questionnaire    Feeling of Stress : Very much  Social Connections: Unknown (08/12/2023)   Social Connection and Isolation Panel [NHANES]    Frequency of Communication with Friends and Family: Patient declined    Frequency of Social Gatherings with Friends and Family: Patient declined    Attends Religious Services: Patient declined    Database administrator or Organizations: No    Attends Engineer, structural: Never    Marital Status: Patient declined    Allergies  Allergen Reactions   Amoxicillin Hives and Rash    Has patient had a PCN reaction causing immediate rash, facial/tongue/throat swelling, SOB or lightheadedness with hypotension: Yes Has patient had a PCN reaction causing severe rash involving mucus  membranes or skin necrosis: Yes Has patient had a PCN reaction that required hospitalization: No Has patient had a PCN reaction occurring within the last 10 years: No If all of the above answers are "NO", then may proceed with Cephalosporin use.    Azithromycin Shortness Of Breath and Rash   Darvocet [Propoxyphene N-Acetaminophen] Shortness Of Breath and Rash   Grapeseed Extract [Nutritional Supplements] Shortness Of Breath, Swelling and Rash    GRAPES   Kiwi Extract Shortness Of Breath, Swelling and Rash   Latex Shortness Of Breath   Macrolides And Ketolides Shortness Of Breath and Rash   Morphine And Codeine Shortness Of Breath, Rash and Other (See Comments)    MUSCLE RIGIDITY   Nutritional Supplements Rash, Shortness  Of Breath and Swelling    GRAPES   Sulfa Antibiotics Shortness Of Breath    MUSCLE RIGIDITY   Sulfamethoxazole-Trimethoprim Anaphylaxis   Klonopin [Clonazepam] Rash and Other (See Comments)    HOMICIDAL THOUGHTS   Penicillins Itching and Swelling    Swelling of mouth Has patient had a PCN reaction causing immediate rash, facial/tongue/throat swelling, SOB or lightheadedness with hypotension: Yes Has patient had a PCN reaction causing severe rash involving mucus membranes or skin necrosis: No Has patient had a PCN reaction that required hospitalization: No Has patient had a PCN reaction occurring within the last 10 years: No If all of the above answers are "NO", then may proceed with Cephalosporin use.    Zoloft [Sertraline Hcl] Other (See Comments)    TARDIVE DYSKINESIA   Contrast Media [Iodinated Contrast Media] Swelling    Pt CAN NOT have drinking contrast; She CAN have IV contrast; when pt drinks water soluble contrast, she develops shallow breathing and hives along with Herpes Type 2 around her eyes   Flagyl [Metronidazole] Nausea And Vomiting   Adhesive [Tape] Rash   Concerta [Methylphenidate] Nausea Only   Lexapro [Escitalopram Oxalate] Diarrhea and Rash    Lortab [Hydrocodone-Acetaminophen] Rash    Outpatient Medications Prior to Visit  Medication Sig Dispense Refill   Accu-Chek Softclix Lancets lancets Use to check blood sugar three times daily. E11.65 100 each 3   busPIRone (BUSPAR) 5 MG tablet Take 1 tablet (5 mg total) by mouth 3 (three) times daily. 90 tablet 1   doxycycline (VIBRAMYCIN) 100 MG capsule Take 100 mg by mouth 2 (two) times daily.     EPINEPHRINE 0.3 mg/0.3 mL IJ SOAJ injection Inject 0.3 mg into the muscle as needed for anaphylaxis. 2 each 2   estradiol (ESTRACE) 0.1 MG/GM vaginal cream 1 gram Vaginal Once a day for 2 weeks then 2 times a week 90 days for 90     hydroxychloroquine (PLAQUENIL) 200 MG tablet Take 200 mg by mouth 2 (two) times daily.     ibuprofen (ADVIL,MOTRIN) 200 MG tablet Take 200 mg by mouth every 6 (six) hours as needed for fever, headache, mild pain, moderate pain or cramping.     OZEMPIC, 0.25 OR 0.5 MG/DOSE, 2 MG/3ML SOPN Inject 0.5 mg into the skin once a week.     QUEtiapine 150 MG TABS Take 150 mg by mouth at bedtime. 30 tablet 1   spironolactone (ALDACTONE) 50 MG tablet Take 50 mg by mouth daily.     tiZANidine (ZANAFLEX) 4 MG tablet TAKE ONE TABLET BY MOUTH EVERY EIGHT HOURS AS NEEDED muscle SPASMS 270 tablet 0   valACYclovir (VALTREX) 1000 MG tablet Take 1,000 mg by mouth daily.     Vitamin D, Ergocalciferol, (DRISDOL) 1.25 MG (50000 UNIT) CAPS capsule TAKE ONE CAPSULE BY MOUTH Once weekly 12 capsule 0   amLODipine (NORVASC) 5 MG tablet Take 1 tablet (5 mg total) by mouth daily. 90 tablet 1   atorvastatin (LIPITOR) 20 MG tablet Take 1 tablet (20 mg total) by mouth daily. 90 tablet 1   DULoxetine (CYMBALTA) 60 MG capsule TAKE ONE CAPSULE BY MOUTH EVERY DAY 90 capsule 1   metFORMIN (GLUCOPHAGE) 500 MG tablet Take 1 tablet (500 mg total) by mouth daily with breakfast. 180 tablet 1   pantoprazole (PROTONIX) 40 MG tablet Take 1 tablet (40 mg total) by mouth daily. 90 tablet 0   pregabalin (LYRICA) 100  MG capsule Take 1 capsule (100 mg total) by mouth 2 (two)  times daily. 60 capsule 3   metroNIDAZOLE (METROGEL) 0.75 % vaginal gel Place 1 Applicatorful vaginally 2 (two) times daily. (Patient not taking: Reported on 10/09/2023) 70 g 0   propranolol (INDERAL) 20 MG tablet Take 2 tablets (40 mg total) by mouth 2 (two) times daily. (Patient not taking: Reported on 10/09/2023) 360 tablet 1   Blood Glucose Monitoring Suppl (ACCU-CHEK GUIDE ME) w/Device KIT Use to check blood sugar three times daily. E11.65 (Patient not taking: Reported on 10/09/2023) 1 kit 0   glucose blood (ACCU-CHEK GUIDE) test strip Use to check blood sugar three times daily. E11.65 (Patient not taking: Reported on 10/09/2023) 100 each 3   nitrofurantoin, macrocrystal-monohydrate, (MACROBID) 100 MG capsule Take 1 capsule (100 mg total) by mouth 2 (two) times daily. (Patient not taking: Reported on 10/09/2023) 10 capsule 0   No facility-administered medications prior to visit.     ROS Review of Systems  Constitutional:  Negative for activity change and appetite change.  HENT:  Negative for sinus pressure and sore throat.   Respiratory:  Negative for chest tightness, shortness of breath and wheezing.   Cardiovascular:  Negative for chest pain and palpitations.  Gastrointestinal:  Negative for abdominal distention, abdominal pain and constipation.  Genitourinary: Negative.   Musculoskeletal: Negative.   Psychiatric/Behavioral:  Negative for behavioral problems and dysphoric mood.     Objective:  BP 109/73   Pulse 75   Ht 5\' 2"  (1.575 m)   Wt 186 lb 6.4 oz (84.6 kg)   SpO2 97%   BMI 34.09 kg/m      10/09/2023    9:51 AM 09/26/2023   11:46 AM 08/12/2023    3:24 PM  BP/Weight  Systolic BP 109  478  Diastolic BP 73  70  Wt. (Lbs) 186.4  184.8  BMI 34.09 kg/m2  33.8 kg/m2     Information is confidential and restricted. Go to Review Flowsheets to unlock data.      Physical Exam Constitutional:      Appearance: She is  well-developed.  Cardiovascular:     Rate and Rhythm: Normal rate.     Heart sounds: Normal heart sounds. No murmur heard. Pulmonary:     Effort: Pulmonary effort is normal.     Breath sounds: Normal breath sounds. No wheezing or rales.  Chest:     Chest wall: No tenderness.  Abdominal:     General: Bowel sounds are normal. There is no distension.     Palpations: Abdomen is soft. There is no mass.     Tenderness: There is no abdominal tenderness.  Musculoskeletal:     Right lower leg: No edema.     Left lower leg: No edema.     Comments: Tenderness on flexion and extension of knees and bilateral hip. Mild lumbar spine tenderness.  Neurological:     Mental Status: She is alert and oriented to person, place, and time.  Psychiatric:        Mood and Affect: Mood normal.        Latest Ref Rng & Units 04/15/2023    9:49 AM 10/08/2022    4:22 PM 04/19/2022   11:59 AM  CMP  Glucose 70 - 99 mg/dL 92  84  295   BUN 6 - 24 mg/dL 6  11  <5   Creatinine 0.57 - 1.00 mg/dL 6.21  3.08  6.57   Sodium 134 - 144 mmol/L 142  146  145   Potassium 3.5 - 5.2 mmol/L 4.4  5.0  4.4   Chloride 96 - 106 mmol/L 102  105  110   CO2 20 - 29 mmol/L 25  26  26    Calcium 8.7 - 10.2 mg/dL 9.4  16.1  9.9   Total Protein 6.0 - 8.5 g/dL 7.3  7.7  7.9   Total Bilirubin 0.0 - 1.2 mg/dL 0.4  0.4  0.5   Alkaline Phos 44 - 121 IU/L 99  88  65   AST 0 - 40 IU/L 24  26  22    ALT 0 - 32 IU/L 32  29  31     Lipid Panel     Component Value Date/Time   CHOL 119 04/15/2023 0949   TRIG 105 04/15/2023 0949   HDL 50 04/15/2023 0949   CHOLHDL 4.3 11/10/2020 0928   LDLCALC 50 04/15/2023 0949    CBC    Component Value Date/Time   WBC 6.0 04/19/2022 1159   RBC 4.93 04/19/2022 1159   HGB 13.2 04/19/2022 1159   HCT 41.0 04/19/2022 1159   PLT 231 04/19/2022 1159   MCV 83.2 04/19/2022 1159   MCH 26.8 04/19/2022 1159   MCHC 32.2 04/19/2022 1159   RDW 15.1 04/19/2022 1159   LYMPHSABS 1.9 04/19/2022 1159   MONOABS  0.4 04/19/2022 1159   EOSABS 0.1 04/19/2022 1159   BASOSABS 0.0 04/19/2022 1159    Lab Results  Component Value Date   HGBA1C 6.7 10/09/2023       Assessment & Plan Vaginal Candidiasis Recurrent infections linked to antibiotic use. Fluconazole effective but daily use not feasible. - Prescribe fluconazole 7 tablets, take every 72 hours as needed for prevention.  Type 2 Diabetes Mellitus Blood glucose fluctuates; A1c at 6.7%. Considering Ozempic for better control and weight loss. Discussed potential benefits and side effects. - Prescribe Ozempic 0.25 mg for 4 weeks, then 0.5 mg for 4 weeks. - Monitor blood glucose, if she has hypoglycemia consider discontinue metformin  Rheumatoid arthritis This could explain her joint pains Previously referred to PT but she could not complete therapy due to transportation challenges Referred back to PT Continue to follow-up with rheumatology  Musculoskeletal Pain and Weakness Chronic pain and weakness in legs, hips, knees, and lower back. Possible contribution from rheumatoid arthritis, fibromyalgia, and lupus. No recent imaging. - Order x-rays of knees, lower back, and hips. - Refer to physical therapy for gait, posture, and transfer training. - Evaluate x-ray results for potential orthopedic referral.  Foot and Toenail Changes Toenail changes noted, no fungal infection. Previous podiatrist referral. - Advise follow-up with podiatrist for toenail evaluation.   Hypertension -Controlled  Schizoaffective disorder -Managed by psychiatry -Continue management per psychiatry   General Health Maintenance Due for ophthalmology appointment. Last blood work normal. - Order blood work to assess current health status. - Encourage attendance at ophthalmology appointment.      Meds ordered this encounter  Medications   fluconazole (DIFLUCAN) 150 MG tablet    Sig: Take 1 tablet (150 mg total) by mouth daily.    Dispense:  7 tablet     Refill:  0   Semaglutide,0.25 or 0.5MG /DOS, (OZEMPIC, 0.25 OR 0.5 MG/DOSE,) 2 MG/1.5ML SOPN    Sig: Inject 0.25 mg into the skin once a week. For 4 weeks then increase to 0.5mg     Dispense:  2 mL    Refill:  0   Semaglutide,0.25 or 0.5MG /DOS, (OZEMPIC, 0.25 OR 0.5 MG/DOSE,) 2 MG/3ML SOPN    Sig: Inject 0.5 mg into the skin  once a week.    Dispense:  3 mL    Refill:  6   amLODipine (NORVASC) 5 MG tablet    Sig: Take 1 tablet (5 mg total) by mouth daily.    Dispense:  90 tablet    Refill:  1   atorvastatin (LIPITOR) 20 MG tablet    Sig: Take 1 tablet (20 mg total) by mouth daily.    Dispense:  90 tablet    Refill:  1   DULoxetine (CYMBALTA) 60 MG capsule    Sig: Take 1 capsule (60 mg total) by mouth daily.    Dispense:  90 capsule    Refill:  1   metFORMIN (GLUCOPHAGE) 500 MG tablet    Sig: Take 1 tablet (500 mg total) by mouth daily with breakfast.    Dispense:  180 tablet    Refill:  1   pantoprazole (PROTONIX) 40 MG tablet    Sig: Take 1 tablet (40 mg total) by mouth daily.    Dispense:  90 tablet    Refill:  1   pregabalin (LYRICA) 100 MG capsule    Sig: Take 1 capsule (100 mg total) by mouth 2 (two) times daily.    Dispense:  60 capsule    Refill:  3    This prescription was filled on 08/27/2023. Any refills authorized will be placed on file.    Follow-up: Return in about 6 months (around 04/10/2024) for Chronic medical conditions.       Hoy Register, MD, FAAFP. [facility] [place], [place] [phone]   10/09/2023, 12:55 PM

## 2023-10-09 NOTE — Patient Instructions (Signed)
 VISIT SUMMARY:  During today's visit, we discussed several of your health concerns, including recurrent yeast infections, diabetes management, musculoskeletal pain and weakness, and changes in your toenails. We have developed a plan to address each of these issues and ensure your overall well-being.  YOUR PLAN:  -VAGINAL CANDIDIASIS: Vaginal candidiasis is a yeast infection that can cause itching, discharge, and discomfort. Since your infections are linked to antibiotic use, we have prescribed fluconazole to take every 72 hours as needed for prevention.  -TYPE 2 DIABETES MELLITUS: Type 2 diabetes is a condition where your body does not use insulin properly, leading to high blood sugar levels. We have prescribed Ozempic to help control your blood sugar and assist with weight loss. Please monitor your blood glucose levels and we will adjust your metformin as needed.  -MUSCULOSKELETAL PAIN AND WEAKNESS: Your chronic pain and weakness in the legs, hips, knees, and lower back may be related to your rheumatoid arthritis, fibromyalgia, and lupus. We have ordered x-rays and will refer you to physical therapy to help with gait, posture, and transfer training.  -FOOT AND TOENAIL CHANGES: Changes in your toenails, such as pinching and discoloration, need further evaluation. We recommend following up with your podiatrist for a thorough examination.  -GENERAL HEALTH MAINTENANCE: It is important to keep up with routine health checks. We have ordered blood work to assess your current health status and encourage you to attend your upcoming ophthalmology appointment.  INSTRUCTIONS:  Please take fluconazole every 72 hours as needed for yeast infection prevention. Start Ozempic at 0.25 mg for 4 weeks, then increase to 0.5 mg for the next 4 weeks. Monitor your blood glucose levels regularly. Schedule x-rays for your knees, lower back, and hips, and follow up with physical therapy. Make an appointment with your  podiatrist for toenail evaluation. Lastly, ensure you complete your blood work and attend your ophthalmology appointment.

## 2023-10-10 ENCOUNTER — Other Ambulatory Visit: Payer: Self-pay | Admitting: Family Medicine

## 2023-10-10 ENCOUNTER — Ambulatory Visit: Payer: Self-pay

## 2023-10-10 ENCOUNTER — Encounter: Payer: Self-pay | Admitting: Family Medicine

## 2023-10-10 DIAGNOSIS — E875 Hyperkalemia: Secondary | ICD-10-CM

## 2023-10-10 LAB — CMP14+EGFR
ALT: 30 IU/L (ref 0–32)
AST: 20 IU/L (ref 0–40)
Albumin: 4.4 g/dL (ref 3.9–4.9)
Alkaline Phosphatase: 106 IU/L (ref 44–121)
BUN/Creatinine Ratio: 16 (ref 9–23)
BUN: 14 mg/dL (ref 6–24)
Bilirubin Total: 0.2 mg/dL (ref 0.0–1.2)
CO2: 24 mmol/L (ref 20–29)
Calcium: 10.5 mg/dL — ABNORMAL HIGH (ref 8.7–10.2)
Chloride: 102 mmol/L (ref 96–106)
Creatinine, Ser: 0.87 mg/dL (ref 0.57–1.00)
Globulin, Total: 3.5 g/dL (ref 1.5–4.5)
Glucose: 123 mg/dL — ABNORMAL HIGH (ref 70–99)
Potassium: 5.7 mmol/L — ABNORMAL HIGH (ref 3.5–5.2)
Sodium: 141 mmol/L (ref 134–144)
Total Protein: 7.9 g/dL (ref 6.0–8.5)
eGFR: 85 mL/min/{1.73_m2} (ref >59)

## 2023-10-10 LAB — LP+NON-HDL CHOLESTEROL
Cholesterol, Total: 140 mg/dL (ref 100–199)
HDL: 48 mg/dL (ref >39)
LDL Chol Calc (NIH): 69 mg/dL (ref 0–99)
Total Non-HDL-Chol (LDL+VLDL): 92 mg/dL (ref 0–129)
Triglycerides: 131 mg/dL (ref 0–149)
VLDL Cholesterol Cal: 23 mg/dL (ref 5–40)

## 2023-10-10 NOTE — Patient Outreach (Signed)
 Care Coordination   10/10/2023 Name: Adrine Hayworth MRN: [id number] DOB: [birth year]   Care Coordination Outreach Attempts:  An unsuccessful outreach was attempted for an appointment today.  Follow Up Plan:  Additional outreach attempts will be made to offer the patient complex care management information and services.   Encounter Outcome:  No Answer   Care Coordination Interventions:  No, not indicated    Delsa Sale RN BSN CCM [place]  [facility], [facility] Nurse Care Coordinator  Direct Dial: [phone] Website: [email or web address]

## 2023-10-11 ENCOUNTER — Ambulatory Visit
Admission: RE | Admit: 2023-10-11 | Discharge: 2023-10-11 | Disposition: A | Discharge status: Home / Self Care | Source: Ambulatory Visit | Attending: Family Medicine | Admitting: Family Medicine

## 2023-10-11 DIAGNOSIS — M25461 Effusion, right knee: Secondary | ICD-10-CM | POA: Diagnosis not present

## 2023-10-11 DIAGNOSIS — M25552 Pain in left hip: Secondary | ICD-10-CM

## 2023-10-11 DIAGNOSIS — M25561 Pain in right knee: Secondary | ICD-10-CM | POA: Diagnosis not present

## 2023-10-11 DIAGNOSIS — M25562 Pain in left knee: Secondary | ICD-10-CM | POA: Diagnosis not present

## 2023-10-11 DIAGNOSIS — G8929 Other chronic pain: Secondary | ICD-10-CM

## 2023-10-14 ENCOUNTER — Encounter: Payer: Self-pay | Admitting: Family Medicine

## 2023-10-18 ENCOUNTER — Encounter: Payer: Self-pay | Admitting: Family Medicine

## 2023-10-23 ENCOUNTER — Other Ambulatory Visit (HOSPITAL_COMMUNITY): Payer: Self-pay | Admitting: Psychiatry

## 2023-10-23 DIAGNOSIS — F4312 Post-traumatic stress disorder, chronic: Secondary | ICD-10-CM

## 2023-10-23 DIAGNOSIS — F250 Schizoaffective disorder, bipolar type: Secondary | ICD-10-CM

## 2023-11-07 ENCOUNTER — Telehealth (HOSPITAL_COMMUNITY): Admitting: Psychiatry

## 2023-11-07 ENCOUNTER — Encounter (HOSPITAL_COMMUNITY): Payer: Self-pay | Admitting: Psychiatry

## 2023-11-07 DIAGNOSIS — F4312 Post-traumatic stress disorder, chronic: Secondary | ICD-10-CM

## 2023-11-07 DIAGNOSIS — F250 Schizoaffective disorder, bipolar type: Secondary | ICD-10-CM

## 2023-11-07 DIAGNOSIS — F1210 Cannabis abuse, uncomplicated: Secondary | ICD-10-CM

## 2023-11-07 MED ORDER — BUSPIRONE HCL 5 MG PO TABS: 5.0000 mg | ORAL_TABLET | Freq: Three times a day (TID) | ORAL | 1 refills | Status: DC

## 2023-11-07 MED ORDER — QUETIAPINE FUMARATE 150 MG PO TABS: 150.0000 mg | ORAL_TABLET | Freq: Every day | ORAL | 1 refills | Status: DC

## 2023-11-07 NOTE — Progress Notes (Signed)
 [facility] MD Virtual Progress Note   Patient Location: Home Provider Location: Office  I connect with patient by video and verified that I am speaking with correct person by using two identifiers. I discussed the limitations of evaluation and management by telemedicine and the availability of in person appointments. I also discussed with the patient that there may be a patient responsible charge related to this service. The patient expressed understanding and agreed to proceed.  Victoria Holmes [id number] 43 y.o.  11/07/2023 10:10 AM  History of Present Illness:  Patient is evaluated by video session.  She is sick and having nasal congestion and cough.  She is taking medicine.  On the last visit we increased BuSpar and Seroquel.  She noticed that help sleep, anxiety, irritability and anger.  She has not in touch with her sister and niece since the last episode.  She tried to keep herself to room and does not leave the house unless it is necessary.  She admitted to still irritability, frustration and sometimes paranoia but denies any hallucination, suicidal thoughts.  We have recommended to see a therapist but patient has not made the efforts.  She reported increased medicine had help with hallucination and sleep but is still having nightmares flashback and intrusive thoughts.  She has no tremors shakes or any EPS.  Patient told Ozempic now restarted after approval from insurance.  Recently she had a visit with primary care.  Her hemoglobin A1c slightly increased from the past.  She is taking Cymbalta 60 mg from PCP.  Patient is going to see a therapist when she feels better to help her chronic PTSD symptoms.  Past Psychiatric History: H/O PTSD, schizoaffective disorder, and bipolar disorder.  H/O cutting wrist and neck in 2007 and multiple inpatient.  Seroquel helped but higher doses made groggy. Tried Depakote but no details. Tried Ativan and Xanax.  Zoloft, Klonopin had side effects.   As per chart she also had tried Prozac, Concerta, Lexapro.  Has seen family services of [place] and [facility].    Outpatient Encounter Medications as of 11/07/2023  Medication Sig   Accu-Chek Softclix Lancets lancets Use to check blood sugar three times daily. E11.65   amLODipine (NORVASC) 5 MG tablet Take 1 tablet (5 mg total) by mouth daily.   atorvastatin (LIPITOR) 20 MG tablet Take 1 tablet (20 mg total) by mouth daily.   busPIRone (BUSPAR) 5 MG tablet Take 1 tablet (5 mg total) by mouth 3 (three) times daily.   doxycycline (VIBRAMYCIN) 100 MG capsule Take 100 mg by mouth 2 (two) times daily.   DULoxetine (CYMBALTA) 60 MG capsule Take 1 capsule (60 mg total) by mouth daily.   EPINEPHRINE 0.3 mg/0.3 mL IJ SOAJ injection Inject 0.3 mg into the muscle as needed for anaphylaxis.   estradiol (ESTRACE) 0.1 MG/GM vaginal cream 1 gram Vaginal Once a day for 2 weeks then 2 times a week 90 days for 90   fluconazole (DIFLUCAN) 150 MG tablet Take 1 tablet (150 mg total) by mouth daily.   hydroxychloroquine (PLAQUENIL) 200 MG tablet Take 200 mg by mouth 2 (two) times daily.   ibuprofen (ADVIL,MOTRIN) 200 MG tablet Take 200 mg by mouth every 6 (six) hours as needed for fever, headache, mild pain, moderate pain or cramping.   metFORMIN (GLUCOPHAGE) 500 MG tablet Take 1 tablet (500 mg total) by mouth daily with breakfast.   metroNIDAZOLE (METROGEL) 0.75 % vaginal gel Place 1 Applicatorful vaginally 2 (two) times daily. (Patient not  taking: Reported on 10/09/2023)   OZEMPIC, 0.25 OR 0.5 MG/DOSE, 2 MG/3ML SOPN Inject 0.5 mg into the skin once a week.   pantoprazole (PROTONIX) 40 MG tablet Take 1 tablet (40 mg total) by mouth daily.   pregabalin (LYRICA) 100 MG capsule Take 1 capsule (100 mg total) by mouth 2 (two) times daily.   propranolol (INDERAL) 20 MG tablet Take 2 tablets (40 mg total) by mouth 2 (two) times daily. (Patient not taking: Reported on 10/09/2023)   QUEtiapine 150 MG TABS Take 150 mg by  mouth at bedtime.   Semaglutide,0.25 or 0.5MG /DOS, (OZEMPIC, 0.25 OR 0.5 MG/DOSE,) 2 MG/1.5ML SOPN Inject 0.25 mg into the skin once a week. For 4 weeks then increase to 0.5mg    Semaglutide,0.25 or 0.5MG /DOS, (OZEMPIC, 0.25 OR 0.5 MG/DOSE,) 2 MG/3ML SOPN Inject 0.5 mg into the skin once a week.   spironolactone (ALDACTONE) 50 MG tablet Take 50 mg by mouth daily.   tiZANidine (ZANAFLEX) 4 MG tablet TAKE ONE TABLET BY MOUTH EVERY EIGHT HOURS AS NEEDED muscle SPASMS   valACYclovir (VALTREX) 1000 MG tablet Take 1,000 mg by mouth daily.   Vitamin D, Ergocalciferol, (DRISDOL) 1.25 MG (50000 UNIT) CAPS capsule TAKE ONE CAPSULE BY MOUTH Once weekly   No facility-administered encounter medications on file as of 11/07/2023.    Recent Results (from the past 2160 hours)  Cervicovaginal ancillary only     Status: Abnormal   Collection Time: 08/12/23  3:31 PM  Result Value Ref Range   Neisseria Gonorrhea Negative    Chlamydia Negative    Trichomonas Negative    Bacterial Vaginitis (gardnerella) Positive (A)    Candida Vaginitis Positive (A)    Candida Glabrata Negative    Comment      Normal Reference Range Bacterial Vaginosis - Negative   Comment Normal Reference Range Candida Species - Negative    Comment Normal Reference Range Candida Galbrata - Negative    Comment Normal Reference Range Trichomonas - Negative    Comment Normal Reference Ranger Chlamydia - Negative    Comment      Normal Reference Range Neisseria Gonorrhea - Negative  POCT URINALYSIS DIP (CLINITEK)     Status: Abnormal   Collection Time: 08/12/23  3:41 PM  Result Value Ref Range   Color, UA yellow yellow   Clarity, UA cloudy (A) clear   Glucose, UA negative negative mg/dL   Bilirubin, UA negative negative   Ketones, POC UA negative negative mg/dL   Spec Grav, UA 8.119 1.478 - 1.025   Blood, UA negative negative   pH, UA 6.0 5.0 - 8.0   POC PROTEIN,UA negative negative, trace   Urobilinogen, UA 1.0 0.2 or 1.0 E.U./dL    Nitrite, UA Positive (A) Negative   Leukocytes, UA Negative Negative  POCT glycosylated hemoglobin (Hb A1C)     Status: Normal   Collection Time: 10/09/23  9:59 AM  Result Value Ref Range   Hemoglobin A1C     HbA1c POC (<> result, manual entry)     HbA1c, POC (prediabetic range)     HbA1c, POC (controlled diabetic range) 6.7 0.0 - 7.0 %  CMP14+EGFR     Status: Abnormal   Collection Time: 10/09/23 10:48 AM  Result Value Ref Range   Glucose 123 (H) 70 - 99 mg/dL   BUN 14 6 - 24 mg/dL   Creatinine, Ser 2.95 0.57 - 1.00 mg/dL   eGFR 85 >62 ZH/YQM/5.78   BUN/Creatinine Ratio 16 9 - 23   Sodium  141 134 - 144 mmol/L   Potassium 5.7 (H) 3.5 - 5.2 mmol/L   Chloride 102 96 - 106 mmol/L   CO2 24 20 - 29 mmol/L   Calcium 10.5 (H) 8.7 - 10.2 mg/dL   Total Protein 7.9 6.0 - 8.5 g/dL   Albumin 4.4 3.9 - 4.9 g/dL   Globulin, Total 3.5 1.5 - 4.5 g/dL   Bilirubin Total 0.2 0.0 - 1.2 mg/dL   Alkaline Phosphatase 106 44 - 121 IU/L   AST 20 0 - 40 IU/L   ALT 30 0 - 32 IU/L  LP+Non-HDL Cholesterol     Status: None   Collection Time: 10/09/23 10:48 AM  Result Value Ref Range   Cholesterol, Total 140 100 - 199 mg/dL   Triglycerides 841 0 - 149 mg/dL   HDL 48 >32 mg/dL   VLDL Cholesterol Cal 23 5 - 40 mg/dL   LDL Chol Calc (NIH) 69 0 - 99 mg/dL   Total Non-HDL-Chol (LDL+VLDL) 92 0 - 129 mg/dL     Psychiatric Specialty Exam: Physical Exam  Review of Systems  Respiratory:  Positive for cough and chest tightness.        Nasal congestion     Weight 186 lb (84.4 kg).There is no height or weight on file to calculate BMI.  General Appearance: Fairly Groomed  Eye Contact:  Fair  Speech:  Slow  Volume:  Decreased  Mood:  Anxious and tired  Affect:  Congruent  Thought Process:  Goal Directed  Orientation:  Full (Time, Place, and Person)  Thought Content:  Rumination  Suicidal Thoughts:  No  Homicidal Thoughts:  No  Memory:  Immediate;   Fair Recent;   Fair Remote;   Fair  Judgement:  Fair   Insight:  Shallow  Psychomotor Activity:  Decreased  Concentration:  Concentration: Fair and Attention Span: Fair  Recall:  [employer] of Knowledge:  Fair  Language:  Good  Akathisia:  No  Handed:  Right  AIMS (if indicated):     Assets:  Communication Skills Desire for Improvement Housing  ADL's:  Intact  Cognition:  WNL  Sleep:  better but still having dreams       11/07/2023   10:17 AM 10/09/2023    9:53 AM 08/12/2023    3:27 PM 12/04/2022    3:30 PM 10/08/2022    3:35 PM  Depression screen PHQ 2/9  Decreased Interest 1 3 3 2 1   Down, Depressed, Hopeless 1 2 2 2 1   PHQ - 2 Score 2 5 5 4 2   Altered sleeping 1 3 1 3 1   Tired, decreased energy 1 3 3 3 2   Change in appetite 0 3 3 3 1   Feeling bad or failure about yourself  0 2 2 2 1   Trouble concentrating 1 3 3 2 3   Moving slowly or fidgety/restless 0 1 1 2 1   Suicidal thoughts 0 0 0 0 0  PHQ-9 Score 5 20 18 19 11   Difficult doing work/chores Somewhat difficult  Very difficult Somewhat difficult     Assessment/Plan: Schizoaffective disorder, bipolar type (HCC) - Plan: busPIRone (BUSPAR) 5 MG tablet, QUEtiapine Fumarate 150 MG TABS  Chronic post-traumatic stress disorder (PTSD) - Plan: busPIRone (BUSPAR) 5 MG tablet, QUEtiapine Fumarate 150 MG TABS  Mild tetrahydrocannabinol (THC) abuse - Plan: busPIRone (BUSPAR) 5 MG tablet  I reviewed blood work results.  Hemoglobin A1c slightly increased but patient restarted taking Ozempic.  She is doing better since the Seroquel  dose and BuSpar dose adjusted and increased.  However she is having upper respiratory infection and she is feeling tired and having cough.  We discussed considering therapy to help her chronic PTSD once again and patient willing to call the places that we have provided.  Patient told one of the family member also recommend to see therapist and she is going to call that therapist very soon.  She like to keep the appointment in 2 months.  We discussed medication  side effects and benefits.  Recommend to stop cannabis use and keep the Seroquel 150 mg at bedtime and BuSpar 5 mg 3 times a day.  Recommended to call us  back if she has any question or any concern.  Follow-up in 2 months.   Follow Up Instructions:     I discussed the assessment and treatment plan with the patient. The patient was provided an opportunity to ask questions and all were answered. The patient agreed with the plan and demonstrated an understanding of the instructions.   The patient was advised to call back or seek an in-person evaluation if the symptoms worsen or if the condition fails to improve as anticipated.    Collaboration of Care: Other provider involved in patient's care AEB notes are available in epic to review  Patient/Guardian was advised Release of Information must be obtained prior to any record release in order to collaborate their care with an outside provider. Patient/Guardian was advised if they have not already done so to contact the registration department to sign all necessary forms in order for us  to release information regarding their care.   Consent: Patient/Guardian gives verbal consent for treatment and assignment of benefits for services provided during this visit. Patient/Guardian expressed understanding and agreed to proceed.     Total encounter time 14 minutes which includes face-to-face time, chart reviewed, care coordination, order entry and documentation during this encounter.   Note: This document was prepared by [employer] voice dictation technology and any errors that results from this process are unintentional.    Arturo Late, MD 11/07/2023

## 2023-11-13 ENCOUNTER — Ambulatory Visit (HOSPITAL_BASED_OUTPATIENT_CLINIC_OR_DEPARTMENT_OTHER): Attending: Family Medicine | Admitting: Physical Therapy

## 2023-11-14 ENCOUNTER — Telehealth: Payer: Self-pay | Admitting: *Deleted

## 2023-11-14 NOTE — Progress Notes (Signed)
 Complex Care Management Care Guide Note  11/14/2023 Name: Diara Chaudhari MRN: [id number] DOB: 1980/12/14  Maricela Schreur is a 43 y.o. year old female who is a primary care patient of Newlin, Enobong, MD and is actively engaged with the care management team. I reached out to Love Rubens by phone today to assist with re-scheduling  with the RN Case Manager.  Follow up plan: Unsuccessful telephone outreach attempt made.   Barnie Bora  [facility]  Value[facility]Based [facility], [facility] Guide  Direct Dial: [phone]  Fax [phone]

## 2023-11-20 NOTE — Progress Notes (Signed)
 Complex Care Management Care Guide Note  11/20/2023 Name: Victoria Holmes MRN: [id number] DOB: [birth year]  Victoria Holmes is a 43 y.o. year old female who is a primary care patient of Newlin, Enobong, MD and is actively engaged with the care management team. I reached out to Love Rubens by phone today to assist with re-scheduling  with the RN Case Manager.  Follow up plan: Unsuccessful telephone outreach attempt made. A HIPAA compliant phone message was left for the patient providing contact information and requesting a return call. No further outreach attempts will be made at this time. We have been unable to contact the patient to reschedule for complex care management services.   Barnie Bora  [facility]  Value-Based Care Institute, [facility] Guide  Direct Dial: [phone]  Fax [phone]

## 2023-11-21 ENCOUNTER — Other Ambulatory Visit: Payer: Self-pay | Admitting: Family Medicine

## 2023-11-21 DIAGNOSIS — I10 Essential (primary) hypertension: Secondary | ICD-10-CM

## 2023-11-28 DIAGNOSIS — L648 Other androgenic alopecia: Secondary | ICD-10-CM | POA: Diagnosis not present

## 2024-01-06 ENCOUNTER — Other Ambulatory Visit (HOSPITAL_COMMUNITY): Payer: Self-pay | Admitting: Psychiatry

## 2024-01-06 DIAGNOSIS — F4312 Post-traumatic stress disorder, chronic: Secondary | ICD-10-CM

## 2024-01-06 DIAGNOSIS — F250 Schizoaffective disorder, bipolar type: Secondary | ICD-10-CM

## 2024-01-20 ENCOUNTER — Other Ambulatory Visit (HOSPITAL_COMMUNITY): Payer: Self-pay | Admitting: Psychiatry

## 2024-01-20 DIAGNOSIS — F4312 Post-traumatic stress disorder, chronic: Secondary | ICD-10-CM

## 2024-01-20 DIAGNOSIS — F250 Schizoaffective disorder, bipolar type: Secondary | ICD-10-CM

## 2024-01-20 DIAGNOSIS — F1210 Cannabis abuse, uncomplicated: Secondary | ICD-10-CM

## 2024-01-28 ENCOUNTER — Other Ambulatory Visit: Payer: Self-pay | Admitting: Family Medicine

## 2024-01-28 DIAGNOSIS — E1169 Type 2 diabetes mellitus with other specified complication: Secondary | ICD-10-CM

## 2024-01-29 DIAGNOSIS — R062 Wheezing: Secondary | ICD-10-CM | POA: Diagnosis not present

## 2024-01-29 DIAGNOSIS — T63441A Toxic effect of venom of bees, accidental (unintentional), initial encounter: Secondary | ICD-10-CM | POA: Diagnosis not present

## 2024-02-04 ENCOUNTER — Other Ambulatory Visit: Payer: Self-pay

## 2024-02-04 ENCOUNTER — Encounter (HOSPITAL_COMMUNITY): Payer: Self-pay | Admitting: Emergency Medicine

## 2024-02-04 ENCOUNTER — Emergency Department (HOSPITAL_COMMUNITY)
Admission: EM | Admit: 2024-02-04 | Discharge: 2024-02-05 | Disposition: A | Discharge status: Home / Self Care | Attending: Emergency Medicine | Admitting: Emergency Medicine

## 2024-02-04 DIAGNOSIS — Z7984 Long term (current) use of oral hypoglycemic drugs: Secondary | ICD-10-CM | POA: Insufficient documentation

## 2024-02-04 DIAGNOSIS — I10 Essential (primary) hypertension: Secondary | ICD-10-CM | POA: Diagnosis not present

## 2024-02-04 DIAGNOSIS — R1030 Lower abdominal pain, unspecified: Secondary | ICD-10-CM | POA: Diagnosis present

## 2024-02-04 DIAGNOSIS — R109 Unspecified abdominal pain: Secondary | ICD-10-CM | POA: Diagnosis not present

## 2024-02-04 DIAGNOSIS — E119 Type 2 diabetes mellitus without complications: Secondary | ICD-10-CM | POA: Diagnosis not present

## 2024-02-04 DIAGNOSIS — M545 Low back pain, unspecified: Secondary | ICD-10-CM | POA: Insufficient documentation

## 2024-02-04 DIAGNOSIS — Z90710 Acquired absence of both cervix and uterus: Secondary | ICD-10-CM | POA: Diagnosis not present

## 2024-02-04 DIAGNOSIS — Z79899 Other long term (current) drug therapy: Secondary | ICD-10-CM | POA: Diagnosis not present

## 2024-02-04 DIAGNOSIS — Z91040 Latex allergy status: Secondary | ICD-10-CM | POA: Insufficient documentation

## 2024-02-04 DIAGNOSIS — N3090 Cystitis, unspecified without hematuria: Secondary | ICD-10-CM | POA: Diagnosis not present

## 2024-02-04 DIAGNOSIS — R3129 Other microscopic hematuria: Secondary | ICD-10-CM | POA: Diagnosis not present

## 2024-02-04 NOTE — ED Triage Notes (Signed)
 Pt in POV with reported generalized weakness and low back pain, also reporting pain to L ribs. States she was stung by a bee on 7/9 and went to an UC in [place]. She states they gave her Epi pen and was d/c with Prednisone  and Benadryl . No hives or sob noted, pt just feels like she is not herself.

## 2024-02-05 ENCOUNTER — Encounter (HOSPITAL_COMMUNITY): Payer: Self-pay

## 2024-02-05 ENCOUNTER — Emergency Department (HOSPITAL_COMMUNITY)

## 2024-02-05 DIAGNOSIS — R3129 Other microscopic hematuria: Secondary | ICD-10-CM | POA: Diagnosis not present

## 2024-02-05 DIAGNOSIS — R109 Unspecified abdominal pain: Secondary | ICD-10-CM | POA: Diagnosis not present

## 2024-02-05 DIAGNOSIS — Z90710 Acquired absence of both cervix and uterus: Secondary | ICD-10-CM | POA: Diagnosis not present

## 2024-02-05 DIAGNOSIS — N3090 Cystitis, unspecified without hematuria: Secondary | ICD-10-CM | POA: Diagnosis not present

## 2024-02-05 LAB — CBC
HCT: 41 % (ref 36.0–46.0)
Hemoglobin: 12.8 g/dL (ref 12.0–15.0)
MCH: 26.4 pg (ref 26.0–34.0)
MCHC: 31.2 g/dL (ref 30.0–36.0)
MCV: 84.7 fL (ref 80.0–100.0)
Platelets: 289 K/uL (ref 150–400)
RBC: 4.84 MIL/uL (ref 3.87–5.11)
RDW: 14.7 % (ref 11.5–15.5)
WBC: 8.4 K/uL (ref 4.0–10.5)
nRBC: 0 % (ref 0.0–0.2)

## 2024-02-05 LAB — URINALYSIS, ROUTINE W REFLEX MICROSCOPIC
Bilirubin Urine: NEGATIVE
Glucose, UA: NEGATIVE mg/dL
Ketones, ur: NEGATIVE mg/dL
Leukocytes,Ua: NEGATIVE
Nitrite: NEGATIVE
Protein, ur: NEGATIVE mg/dL
Specific Gravity, Urine: 1.005 (ref 1.005–1.030)
pH: 5 (ref 5.0–8.0)

## 2024-02-05 LAB — COMPREHENSIVE METABOLIC PANEL WITH GFR
ALT: 29 U/L (ref 0–44)
AST: 20 U/L (ref 15–41)
Albumin: 4 g/dL (ref 3.5–5.0)
Alkaline Phosphatase: 77 U/L (ref 38–126)
Anion gap: 10 (ref 5–15)
BUN: 11 mg/dL (ref 6–20)
CO2: 26 mmol/L (ref 22–32)
Calcium: 9.8 mg/dL (ref 8.9–10.3)
Chloride: 107 mmol/L (ref 98–111)
Creatinine, Ser: 0.78 mg/dL (ref 0.44–1.00)
GFR, Estimated: >60 mL/min (ref >60)
Glucose, Bld: 131 mg/dL — ABNORMAL HIGH (ref 70–99)
Potassium: 4.8 mmol/L (ref 3.5–5.1)
Sodium: 143 mmol/L (ref 135–145)
Total Bilirubin: 0.8 mg/dL (ref 0.0–1.2)
Total Protein: 8.4 g/dL — ABNORMAL HIGH (ref 6.5–8.1)

## 2024-02-05 LAB — PREGNANCY, URINE: Preg Test, Ur: NEGATIVE

## 2024-02-05 MED ORDER — CEPHALEXIN 500 MG PO CAPS: 500.0000 mg | ORAL_CAPSULE | Freq: Four times a day (QID) | ORAL | 0 refills | Status: DC

## 2024-02-05 MED ORDER — ONDANSETRON 4 MG PO TBDP
4.0000 mg | ORAL_TABLET | Freq: Once | ORAL | Status: AC
  Administered 2024-02-05: 4 mg via ORAL
  Filled 2024-02-05: qty 1

## 2024-02-05 MED ORDER — ONDANSETRON HCL 4 MG PO TABS: 4.0000 mg | ORAL_TABLET | Freq: Four times a day (QID) | ORAL | 0 refills | Status: AC | PRN

## 2024-02-05 NOTE — Discharge Instructions (Signed)
 It was a pleasure taking part in your care.  As discussed, I am treating you for suspected urinary tract infection.  Please begin taking Keflex  4 times a day for the next 7 days.  Please follow-up with your PCP for further management and care.  Please take Zofran  every 6 hours as needed for nausea.  Please return to the ED with any new or worsening symptoms.

## 2024-02-05 NOTE — ED Provider Notes (Signed)
 [place] [facility] AT [facility] Provider Note   CSN: [id number] Arrival date & time: 02/04/24  2310     Patient presents with: Insect Bite and Back Pain   Victoria Holmes is a 43 y.o. female with medical history of sleep disturbance, sinusitis, schizoaffective disorder, rheumatoid arthritis, Raynaud's disease, panic attacks, neuritis, lupus, hypertension, diabetes, anxiety, depression, fibromyalgia.  Patient presents to ED for evaluation of lethargy.  She states that on the ninth of this month she was stung by bee.  She reports that she was in [place] when this occurred and she sought care at a urgent care.  She was provided with epinephrine , steroids, Benadryl .  The patient was discharged with steroids which she has been taking since the ninth.  She reports that she is here because she does not feel herself.  She states that she will feel nauseous after taking her steroids.  She states she has 2 days left.  She also is complaining of low back pain but reports has a history of low back pain.  She is concerned she might have a UTI.  She endorses lower abdominal pain as well as dysuria.  She denies vaginal discharge or concern for STI.  She denies fevers at home.  Denies any chest pain or shortness of breath.    Back Pain      Prior to Admission medications   Medication Sig Start Date End Date Taking? Authorizing Provider  cephALEXin  (KEFLEX ) 500 MG capsule Take 1 capsule (500 mg total) by mouth 4 (four) times daily. 02/05/24  Yes Ruthell Lonni FALCON, PA-C  ondansetron  (ZOFRAN ) 4 MG tablet Take 1 tablet (4 mg total) by mouth every 6 (six) hours as needed for nausea or vomiting. 02/05/24  Yes Ruthell Lonni FALCON, PA-C  Accu-Chek Softclix Lancets lancets Use to check blood sugar three times daily. E11.65 03/23/21   Newlin, Enobong, MD  amLODipine  (NORVASC ) 5 MG tablet Take 1 tablet (5 mg total) by mouth daily. 11/21/23   Newlin, Enobong, MD  atorvastatin  (LIPITOR) 20 MG  tablet Take 1 tablet (20 mg total) by mouth daily. 10/09/23   Newlin, Enobong, MD  busPIRone  (BUSPAR ) 5 MG tablet Take 1 tablet (5 mg total) by mouth 3 (three) times daily. 11/07/23   Arfeen, Leni DASEN, MD  doxycycline (VIBRAMYCIN) 100 MG capsule Take 100 mg by mouth 2 (two) times daily. 10/03/23   [provider]  DULoxetine  (CYMBALTA ) 60 MG capsule Take 1 capsule (60 mg total) by mouth daily. 10/09/23   Newlin, Enobong, MD  EPINEPHRINE  0.3 mg/0.3 mL IJ SOAJ injection Inject 0.3 mg into the muscle as needed for anaphylaxis. 06/28/23   Newlin, Enobong, MD  estradiol (ESTRACE) 0.1 MG/GM vaginal cream 1 gram Vaginal Once a day for 2 weeks then 2 times a week 90 days for 90 05/15/22   [provider]  fluconazole  (DIFLUCAN ) 150 MG tablet Take 1 tablet (150 mg total) by mouth daily. 10/09/23   Newlin, Enobong, MD  hydroxychloroquine  (PLAQUENIL ) 200 MG tablet Take 200 mg by mouth 2 (two) times daily.    [provider]  ibuprofen (ADVIL,MOTRIN) 200 MG tablet Take 200 mg by mouth every 6 (six) hours as needed for fever, headache, mild pain, moderate pain or cramping.    [provider]  metFORMIN  (GLUCOPHAGE ) 500 MG tablet Take 1 tablet (500 mg total) by mouth daily with breakfast. 10/09/23   Delbert Clam, MD  metroNIDAZOLE  (METROGEL ) 0.75 % vaginal gel Place 1 Applicatorful vaginally 2 (two)  times daily. Patient not taking: Reported on 10/09/2023 08/14/23   Fleming, Zelda W, NP  OZEMPIC , 0.25 OR 0.5 MG/DOSE, 2 MG/3ML SOPN Inject 0.5 mg into the skin once a week.    [provider]  pantoprazole  (PROTONIX ) 40 MG tablet Take 1 tablet (40 mg total) by mouth daily. 10/09/23   Newlin, Enobong, MD  pregabalin  (LYRICA ) 100 MG capsule Take 1 capsule (100 mg total) by mouth 2 (two) times daily. 10/09/23   Newlin, Enobong, MD  propranolol  (INDERAL ) 20 MG tablet Take 2 tablets (40 mg total) by mouth 2 (two) times daily. Patient not taking: Reported on 10/09/2023 04/10/23   Newlin,  Enobong, MD  QUEtiapine  Fumarate 150 MG TABS Take 150 mg by mouth at bedtime. 11/07/23 02/05/24  Arfeen, Leni DASEN, MD  Semaglutide ,0.25 or 0.5MG /DOS, (OZEMPIC , 0.25 OR 0.5 MG/DOSE,) 2 MG/1.5ML SOPN Inject 0.25 mg into the skin once a week. For 4 weeks then increase to 0.5mg  10/09/23   Newlin, Enobong, MD  Semaglutide ,0.25 or 0.5MG /DOS, (OZEMPIC , 0.25 OR 0.5 MG/DOSE,) 2 MG/3ML SOPN Inject 0.5 mg into the skin once a week. 10/09/23   Newlin, Enobong, MD  spironolactone (ALDACTONE) 50 MG tablet Take 50 mg by mouth daily.    [provider]  tiZANidine  (ZANAFLEX ) 4 MG tablet TAKE ONE TABLET BY MOUTH EVERY EIGHT HOURS AS NEEDED muscle SPASMS 07/01/23   Delbert Clam, MD  valACYclovir (VALTREX) 1000 MG tablet Take 1,000 mg by mouth daily. 05/03/20   [provider]  Vitamin D , Ergocalciferol , (DRISDOL ) 1.25 MG (50000 UNIT) CAPS capsule TAKE ONE CAPSULE BY MOUTH Once weekly 01/25/23   Newlin, Enobong, MD    Allergies: Amoxicillin , Azithromycin, Darvocet [propoxyphene n-acetaminophen ], Grapeseed extract [nutritional supplements], Kiwi extract, Latex, Macrolides and ketolides, Morphine and codeine, Nutritional supplements, Sulfa antibiotics, Sulfamethoxazole-trimethoprim, Klonopin [clonazepam], Penicillins, Zoloft [sertraline hcl], Contrast media [iodinated contrast media], Flagyl  [metronidazole ], Other, Adhesive [tape], Concerta [methylphenidate], Lexapro [escitalopram oxalate], and Lortab [hydrocodone-acetaminophen ]    Review of Systems  Constitutional:  Positive for activity change.  Gastrointestinal:  Positive for nausea.  Musculoskeletal:  Positive for back pain.  All other systems reviewed and are negative.   Updated Vital Signs BP 115/72   Pulse (!) 58   Temp 98.1 F (36.7 C) (Oral)   Resp 16   Wt 84.4 kg   SpO2 96%   BMI 34.03 kg/m   Physical Exam Vitals and nursing note reviewed.  Constitutional:      General: She is not in acute distress.    Appearance: She is  well-developed.  HENT:     Head: Normocephalic and atraumatic.  Eyes:     Conjunctiva/sclera: Conjunctivae normal.  Cardiovascular:     Rate and Rhythm: Normal rate and regular rhythm.     Heart sounds: No murmur heard. Pulmonary:     Effort: Pulmonary effort is normal. No respiratory distress.     Breath sounds: Normal breath sounds.  Abdominal:     Palpations: Abdomen is soft.     Tenderness: There is no abdominal tenderness.  Musculoskeletal:        General: No swelling.     Cervical back: Neck supple.  Skin:    General: Skin is warm and dry.     Capillary Refill: Capillary refill takes less than 2 seconds.  Neurological:     Mental Status: She is alert and oriented to person, place, and time. Mental status is at baseline.  Psychiatric:        Mood and Affect: Mood normal.     (  all labs ordered are listed, but only abnormal results are displayed) Labs Reviewed  COMPREHENSIVE METABOLIC PANEL WITH GFR - Abnormal; Notable for the following components:      Result Value   Glucose, Bld 131 (*)    Total Protein 8.4 (*)    All other components within normal limits  URINALYSIS, ROUTINE W REFLEX MICROSCOPIC - Abnormal; Notable for the following components:   Color, Urine STRAW (*)    Hgb urine dipstick SMALL (*)    Bacteria, UA FEW (*)    All other components within normal limits  URINE CULTURE  CBC  PREGNANCY, URINE    EKG: None  Radiology: CT Renal Stone Study Result Date: 02/05/2024 CLINICAL DATA:  Left-sided flank pain and microhematuria EXAM: CT ABDOMEN AND PELVIS WITHOUT CONTRAST TECHNIQUE: Multidetector CT imaging of the abdomen and pelvis was performed following the standard protocol without IV contrast. RADIATION DOSE REDUCTION: This exam was performed according to the departmental dose-optimization program which includes automated exposure control, adjustment of the mA and/or kV according to patient size and/or use of iterative reconstruction technique.  COMPARISON:  03/27/2021 FINDINGS: Lower chest: No acute abnormality. Hepatobiliary: No focal liver abnormality is seen. Status post cholecystectomy. No biliary dilatation. Pancreas: Unremarkable. No pancreatic ductal dilatation or surrounding inflammatory changes. Spleen: Normal in size without focal abnormality. Adrenals/Urinary Tract: Adrenal glands are within normal limits. Kidneys are well visualize without renal calculi or obstructive changes. The ureters are within normal limits. Bladder is within normal limits. Stomach/Bowel: Scattered fecal material is noted throughout the colon. No obstructive changes are noted. No inflammatory changes are noted. The appendix is within normal limits. Small bowel and stomach are unremarkable. Vascular/Lymphatic: Aortic atherosclerosis. No enlarged abdominal or pelvic lymph nodes. Reproductive: Status post hysterectomy. No adnexal masses. Other: No abdominal wall hernia or abnormality. No abdominopelvic ascites. Musculoskeletal: No acute or significant osseous findings. IMPRESSION: No acute abnormality noted. Electronically Signed   By: Oneil Devonshire M.D.   On: 02/05/2024 02:51     Procedures   Medications Ordered in the ED - No data to display  Medical Decision Making Amount and/or Complexity of Data Reviewed Labs: ordered. Radiology: ordered.   43 year old female presents for evaluation.  Please see HPI for further details.  On examination the patient is afebrile and nontachycardic.  Her lung sounds are clear bilaterally, she is not hypoxic.  Abdomen is soft and compressible.  Neurological examinations at baseline.  Vital signs reassuring.  Patient overall nontoxic in appearance, in no apparent distress.  She presents complaining of generalized fatigue and malaise as well as nausea after taking steroids.  She reports that she was seen on the ninth at an urgent care where she was provided with epinephrine  and Benadryl  and steroids for possible allergic  reaction to a bee sting.  She states that ever since this is occurred, she has not felt her self.  She endorses 2 episodes of nausea and vomiting but denies any diarrhea, abdominal pain.  Denies any chest pain or shortness of breath, fevers.  She reports that the bee sting on her left forearm is itchy but appears to be improving in appearance.  She reports she is not putting blue ointmentz on there for itching.  She has not followed with her PCP yet.  She states that she is here out of concern of possible UTI as well as generalized fatigue.  Patient labs collected in triage are unremarkable.  CBC without cytosis or anemia.  Metabolic panel without electrolyte derangement.  Urinalysis shows small hemoglobin, few bacteria.  Patient urine was cultured.  The patient does endorse dysuria.  Consideration was given to this dysuria being caused by renal stone so CT renal stone study was obtained.  CT renal stone negative for any kind of nephrolithiasis.  At this time, patient will be discharged home with Keflex  for suspected cystitis.  Will culture patient urine.  She was advised to follow-up with her doctor which she reports she will.  She was advised to return to the ED with any new or worsening symptoms and she voiced understanding.  Stable to discharge home.    Final diagnoses:  Low back pain, unspecified back pain laterality, unspecified chronicity, unspecified whether sciatica present  Cystitis    ED Discharge Orders          Ordered    cephALEXin  (KEFLEX ) 500 MG capsule  4 times daily        02/05/24 0257    ondansetron  (ZOFRAN ) 4 MG tablet  Every 6 hours PRN        02/05/24 0257               Ruthell Lonni FALCON, PA-C 02/05/24 0258    Trine Raynell Moder, MD 02/10/24 [phone]

## 2024-02-07 ENCOUNTER — Telehealth (HOSPITAL_COMMUNITY): Admitting: Psychiatry

## 2024-02-07 ENCOUNTER — Encounter (HOSPITAL_COMMUNITY): Payer: Self-pay | Admitting: Psychiatry

## 2024-02-07 DIAGNOSIS — F250 Schizoaffective disorder, bipolar type: Secondary | ICD-10-CM

## 2024-02-07 DIAGNOSIS — F1210 Cannabis abuse, uncomplicated: Secondary | ICD-10-CM

## 2024-02-07 DIAGNOSIS — F4312 Post-traumatic stress disorder, chronic: Secondary | ICD-10-CM | POA: Diagnosis not present

## 2024-02-07 LAB — URINE CULTURE: Culture: >=100000 — AB

## 2024-02-07 MED ORDER — BUSPIRONE HCL 5 MG PO TABS: 5.0000 mg | ORAL_TABLET | Freq: Three times a day (TID) | ORAL | 2 refills | Status: AC

## 2024-02-07 MED ORDER — QUETIAPINE FUMARATE 150 MG PO TABS: 150.0000 mg | ORAL_TABLET | Freq: Every day | ORAL | 2 refills | Status: DC

## 2024-02-07 NOTE — Progress Notes (Signed)
 [facility] MD Virtual Progress Note   Patient Location: Home Provider Location: Home Office  I connect with patient by video and verified that I am speaking with correct person by using two identifiers. I discussed the limitations of evaluation and management by telemedicine and the availability of in person appointments. I also discussed with the patient that there may be a patient responsible charge related to this service. The patient expressed understanding and agreed to proceed.  Victoria Holmes [id number] 43 y.o.  02/07/2024 10:27 AM  History of Present Illness:  Patient is evaluated by video session.  She reported a lot of stress and anxiety because 59 year old father not doing very well.  She tried to see him at least once a month and he lives 3 hours away.  Patient told his nephew and niece moved from [place]  to help patient's father but he does not want them other than his own daughter.  Patient feels sometime burnout taking care of father because he has mood fluctuation and memory fluctuation.  She gets easily emotional about the father.  She feels that trigger her insomnia and also not taking the Seroquel  every night because she does not want to be very sleepy and groggy next day.  Especially when she has to drive and take care of the elderly father.  Patient told few weeks ago she was stung by bee and then she has given steroids which she had reaction and she feels very lethargic, tired and seen in the emergency room.  She is no longer taking steroids.  She still has a lot of ruminative thoughts, flashback, paranoia and some time dysphoria.  However she denies any suicidal thoughts or homicidal thoughts.  She is taking Cymbalta  from PCP.  She takes BuSpar  every day which helps some of her anxiety.  Since taking care of the father she had cut down her cannabis use because does not want to smoke in front of the father.  Patient admitted not have enough time to consider  therapy.  She like to keep the current medicine and she promised that she will take the quetiapine  every night which helps if not taking care of the elderly father.  Past Psychiatric History: H/O PTSD, schizoaffective disorder, and bipolar disorder.  H/O cutting wrist and neck in 2007 and multiple inpatient.  Seroquel  helped but higher doses made groggy. Tried Depakote but no details. Tried Ativan  and Xanax.  Zoloft, Klonopin had side effects.  As per chart she also had tried Prozac, Concerta, Lexapro.  Has seen [facility] of [place] and [facility].    Outpatient Encounter Medications as of 02/07/2024  Medication Sig   Accu-Chek Softclix Lancets lancets Use to check blood sugar three times daily. E11.65   amLODipine  (NORVASC ) 5 MG tablet Take 1 tablet (5 mg total) by mouth daily.   atorvastatin  (LIPITOR) 20 MG tablet Take 1 tablet (20 mg total) by mouth daily.   busPIRone  (BUSPAR ) 5 MG tablet Take 1 tablet (5 mg total) by mouth 3 (three) times daily.   cephALEXin  (KEFLEX ) 500 MG capsule Take 1 capsule (500 mg total) by mouth 4 (four) times daily.   doxycycline (VIBRAMYCIN) 100 MG capsule Take 100 mg by mouth 2 (two) times daily.   DULoxetine  (CYMBALTA ) 60 MG capsule Take 1 capsule (60 mg total) by mouth daily.   EPINEPHRINE  0.3 mg/0.3 mL IJ SOAJ injection Inject 0.3 mg into the muscle as needed for anaphylaxis.   estradiol (ESTRACE) 0.1 MG/GM vaginal cream 1 gram  Vaginal Once a day for 2 weeks then 2 times a week 90 days for 90   fluconazole  (DIFLUCAN ) 150 MG tablet Take 1 tablet (150 mg total) by mouth daily.   hydroxychloroquine  (PLAQUENIL ) 200 MG tablet Take 200 mg by mouth 2 (two) times daily.   ibuprofen (ADVIL,MOTRIN) 200 MG tablet Take 200 mg by mouth every 6 (six) hours as needed for fever, headache, mild pain, moderate pain or cramping.   metFORMIN  (GLUCOPHAGE ) 500 MG tablet Take 1 tablet (500 mg total) by mouth daily with breakfast.   metroNIDAZOLE  (METROGEL ) 0.75 % vaginal gel  Place 1 Applicatorful vaginally 2 (two) times daily. (Patient not taking: Reported on 10/09/2023)   ondansetron  (ZOFRAN ) 4 MG tablet Take 1 tablet (4 mg total) by mouth every 6 (six) hours as needed for nausea or vomiting.   OZEMPIC , 0.25 OR 0.5 MG/DOSE, 2 MG/3ML SOPN Inject 0.5 mg into the skin once a week.   pantoprazole  (PROTONIX ) 40 MG tablet Take 1 tablet (40 mg total) by mouth daily.   pregabalin  (LYRICA ) 100 MG capsule Take 1 capsule (100 mg total) by mouth 2 (two) times daily.   propranolol  (INDERAL ) 20 MG tablet Take 2 tablets (40 mg total) by mouth 2 (two) times daily. (Patient not taking: Reported on 10/09/2023)   QUEtiapine  Fumarate 150 MG TABS Take 150 mg by mouth at bedtime.   Semaglutide ,0.25 or 0.5MG /DOS, (OZEMPIC , 0.25 OR 0.5 MG/DOSE,) 2 MG/1.5ML SOPN Inject 0.25 mg into the skin once a week. For 4 weeks then increase to 0.5mg    Semaglutide ,0.25 or 0.5MG /DOS, (OZEMPIC , 0.25 OR 0.5 MG/DOSE,) 2 MG/3ML SOPN Inject 0.5 mg into the skin once a week.   spironolactone (ALDACTONE) 50 MG tablet Take 50 mg by mouth daily.   tiZANidine  (ZANAFLEX ) 4 MG tablet TAKE ONE TABLET BY MOUTH EVERY EIGHT HOURS AS NEEDED muscle SPASMS   valACYclovir (VALTREX) 1000 MG tablet Take 1,000 mg by mouth daily.   Vitamin D , Ergocalciferol , (DRISDOL ) 1.25 MG (50000 UNIT) CAPS capsule TAKE ONE CAPSULE BY MOUTH Once weekly   No facility-administered encounter medications on file as of 02/07/2024.    Recent Results (from the past 2160 hours)  Urinalysis, Routine w reflex microscopic -Urine, Clean Catch     Status: Abnormal   Collection Time: 02/04/24 12:30 AM  Result Value Ref Range   Color, Urine STRAW (A) YELLOW   APPearance CLEAR CLEAR   Specific Gravity, Urine 1.005 1.005 - 1.030   pH 5.0 5.0 - 8.0   Glucose, UA NEGATIVE NEGATIVE mg/dL   Hgb urine dipstick SMALL (A) NEGATIVE   Bilirubin Urine NEGATIVE NEGATIVE   Ketones, ur NEGATIVE NEGATIVE mg/dL   Protein, ur NEGATIVE NEGATIVE mg/dL   Nitrite  NEGATIVE NEGATIVE   Leukocytes,Ua NEGATIVE NEGATIVE   RBC / HPF 0-5 0 - 5 RBC/hpf   WBC, UA 0-5 0 - 5 WBC/hpf   Bacteria, UA FEW (A) NONE SEEN   Squamous Epithelial / HPF 0-5 0 - 5 /HPF   Mucus PRESENT     Comment: Performed at [facility], [address], [place], [place] [address]  Comprehensive metabolic panel     Status: Abnormal   Collection Time: 02/04/24 11:49 PM  Result Value Ref Range   Sodium 143 135 - 145 mmol/L   Potassium 4.8 3.5 - 5.1 mmol/L   Chloride 107 98 - 111 mmol/L   CO2 26 22 - 32 mmol/L   Glucose, Bld 131 (H) 70 - 99 mg/dL    Comment: Glucose  reference range applies only to samples taken after fasting for at least 8 hours.   BUN 11 6 - 20 mg/dL   Creatinine, Ser 9.21 0.44 - 1.00 mg/dL   Calcium  9.8 8.9 - 10.3 mg/dL   Total Protein 8.4 (H) 6.5 - 8.1 g/dL   Albumin 4.0 3.5 - 5.0 g/dL   AST 20 15 - 41 U/L   ALT 29 0 - 44 U/L   Alkaline Phosphatase 77 38 - 126 U/L   Total Bilirubin 0.8 0.0 - 1.2 mg/dL   GFR, Estimated >39 >39 mL/min    Comment: (NOTE) Calculated using the CKD-EPI Creatinine Equation (2021)    Anion gap 10 5 - 15    Comment: Performed at [facility], [address] [place], [place] [address]  CBC     Status: None   Collection Time: 02/04/24 11:49 PM  Result Value Ref Range   WBC 8.4 4.0 - 10.5 K/uL   RBC 4.84 3.87 - 5.11 MIL/uL   Hemoglobin 12.8 12.0 - 15.0 g/dL   HCT 58.9 63.9 - 53.9 %   MCV 84.7 80.0 - 100.0 fL   MCH 26.4 26.0 - 34.0 pg   MCHC 31.2 30.0 - 36.0 g/dL   RDW 85.2 88.4 - 84.4 %   Platelets 289 150 - 400 K/uL   nRBC 0.0 0.0 - 0.2 %    Comment: Performed at [facility], [address], [place], [place] [address]  Urine Culture     Status: Abnormal   Collection Time: 02/04/24 11:49 PM   Specimen: Urine, Clean Catch  Result Value Ref Range   Specimen Description      URINE, CLEAN CATCH Performed at [facility], [address],  [place], [place] [address]    Special Requests      NONE Performed at [facility], [address] [place], [place] [address]    Culture >=100,000 COLONIES/mL ESCHERICHIA COLI (A)    Report Status 02/07/2024 FINAL    Organism ID, Bacteria ESCHERICHIA COLI (A)       Susceptibility   Escherichia coli - MIC*    AMPICILLIN  >=32 RESISTANT Resistant     CEFAZOLIN <=4 SENSITIVE Sensitive     CEFEPIME <=0.12 SENSITIVE Sensitive     CEFTRIAXONE <=0.25 SENSITIVE Sensitive     CIPROFLOXACIN <=0.25 SENSITIVE Sensitive     GENTAMICIN <=1 SENSITIVE Sensitive     IMIPENEM <=0.25 SENSITIVE Sensitive     NITROFURANTOIN  <=16 SENSITIVE Sensitive     TRIMETH/SULFA <=20 SENSITIVE Sensitive     AMPICILLIN /SULBACTAM >=32 RESISTANT Resistant     PIP/TAZO <=4 SENSITIVE Sensitive ug/mL    * >=100,000 COLONIES/mL ESCHERICHIA COLI  Pregnancy, urine     Status: None   Collection Time: 02/04/24 11:59 PM  Result Value Ref Range   Preg Test, Ur NEGATIVE NEGATIVE    Comment:        THE SENSITIVITY OF THIS METHODOLOGY IS >20 mIU/mL. Performed at [facility], [address] [place], [place] [address]      Psychiatric Specialty Exam: Physical Exam  Review of Systems  Weight 186 lb (84.4 kg).There is no height or weight on file to calculate BMI.  General Appearance: Casual and Fairly Groomed  Eye Contact:  Fair  Speech:  Pressured and Slow  Volume:  Decreased  Mood:  Anxious, Dysphoric, and emotional and sometimes tearful  Affect:  Labile  Thought Process:  Descriptions of Associations: Intact  Orientation:  Full (Time, Place, and Person)  Thought Content:  Rumination  Suicidal Thoughts:  No  Homicidal Thoughts:  No  Memory:  Immediate;   Fair Recent;   Fair Remote;   Fair  Judgement:  Fair  Insight:  Shallow  Psychomotor Activity:  Decreased  Concentration:  Concentration: Fair and Attention Span: Fair  Recall:  Good  Fund of Knowledge:  Good  Language:  Good   Akathisia:  No  Handed:  Right  AIMS (if indicated):     Assets:  Communication Skills Desire for Improvement Housing Transportation  ADL's:  Intact  Cognition:  WNL  Sleep: Fair, at times having dreams and flashback.       11/07/2023   10:17 AM 10/09/2023    9:53 AM 08/12/2023    3:27 PM 12/04/2022    3:30 PM 10/08/2022    3:35 PM  Depression screen PHQ 2/9  Decreased Interest 1 3 3 2 1   Down, Depressed, Hopeless 1 2 2 2 1   PHQ - 2 Score 2 5 5 4 2   Altered sleeping 1 3 1 3 1   Tired, decreased energy 1 3 3 3 2   Change in appetite 0 3 3 3 1   Feeling bad or failure about yourself  0 2 2 2 1   Trouble concentrating 1 3 3 2 3   Moving slowly or fidgety/restless 0 1 1 2 1   Suicidal thoughts 0 0 0 0 0  PHQ-9 Score 5 20 18 19 11   Difficult doing work/chores Somewhat difficult  Very difficult Somewhat difficult     Assessment/Plan: Schizoaffective disorder, bipolar type (HCC) - Plan: busPIRone  (BUSPAR ) 5 MG tablet, QUEtiapine  Fumarate 150 MG TABS  Chronic post-traumatic stress disorder (PTSD) - Plan: busPIRone  (BUSPAR ) 5 MG tablet, QUEtiapine  Fumarate 150 MG TABS  Mild tetrahydrocannabinol (THC) abuse - Plan: busPIRone  (BUSPAR ) 5 MG tablet  Patient is 43 year old female with history of hypertension, Raynaud's disease, diabetes mellitus, history of epilepsy, rheumatoid arthritis, PTSD, schizophrenia, chronic PTSD and cannabis use.  Reviewed blood work results from recent emergency room visit and collect information from other provider.  Discussed stressors as taking care of elderly father who lives 3 hours away.  We have recommended therapy for PTSD however patient is busy taking care of father.  I also discussed that maximum beneficial effect of the medicine if she takes the quetiapine  every night but realized that she cannot take it while she is taking care of the elderly father.  Discussed to reach out to other support system to get some help and discuss caretaker burden.  Patient told  niece and nephew are they about her father mostly relied on her.  Now she is thinking to have a long-term placement as father does not need help every day.  Encouraged to reach out to us  if she need to adjust the medication but patient also promised that she will take the medicine as prescribed.  She will also look into therapy appointments.  Will continue Seroquel  150 mg to take at bedtime and BuSpar  5 mg 3 times a day.  Will follow-up in 3 months unless patient requires sooner appointment.   Follow Up Instructions:     I discussed the assessment and treatment plan with the patient. The patient was provided an opportunity to ask questions and all were answered. The patient agreed with the plan and demonstrated an understanding of the instructions.   The patient was advised to call back or seek an in-person evaluation if the symptoms worsen or if  the condition fails to improve as anticipated.    Collaboration of Care: Other provider involved in patient's care AEB notes are available in epic to review  Patient/Guardian was advised Release of Information must be obtained prior to any record release in order to collaborate their care with an outside provider. Patient/Guardian was advised if they have not already done so to contact the registration department to sign all necessary forms in order for us  to release information regarding their care.   Consent: Patient/Guardian gives verbal consent for treatment and assignment of benefits for services provided during this visit. Patient/Guardian expressed understanding and agreed to proceed.     Total encounter time 25 minutes which includes face-to-face time, chart reviewed, care coordination, order entry and documentation during this encounter.   Note: This document was prepared by [employer] voice dictation technology and any errors that results from this process are unintentional.    Leni ONEIDA Client, MD 02/07/2024

## 2024-02-08 ENCOUNTER — Telehealth (HOSPITAL_BASED_OUTPATIENT_CLINIC_OR_DEPARTMENT_OTHER): Payer: Self-pay | Admitting: *Deleted

## 2024-02-08 NOTE — Telephone Encounter (Signed)
 Post ED Visit - Positive Culture Follow-up  Culture report reviewed by antimicrobial stewardship pharmacist: [facility] []  Rankin Dee, Pharm.D. []  Venetia Gully, Pharm.D., BCPS AQ-ID []  Garrel Crews, Pharm.D., BCPS []  Almarie Lunger, Pharm.D., BCPS []  [place], [address].D., BCPS, AAHIVP []  Rosaline Bihari, Pharm.D., BCPS, AAHIVP []  Vernell Meier, PharmD, BCPS []  Latanya Hint, PharmD, BCPS []  Donald Medley, PharmD, BCPS []  Rocky Bold, PharmD []  Dorothyann Alert, PharmD, BCPS []  Morene Babe, PharmD  [facility] []  Rosaline Edison, PharmD []  Romona Bliss, PharmD []  Dolphus Roller, PharmD []  Veva Seip, Rph []  Vernell Daunt) Leonce, PharmD []  Eva Allis, PharmD []  Rosaline Millet, PharmD []  Iantha Batch, PharmD []  Arvin Gauss, PharmD []  Wanda Hasting, PharmD []  Ronal Rav, PharmD []  Rocky Slade, PharmD [x]  Eleanor Agent, PharmD   Positive urine culture Treated with Cephalexin , organism sensitive to the same and no further patient follow-up is required at this time.  Victoria Holmes 02/08/2024, 10:03 AM

## 2024-02-10 ENCOUNTER — Ambulatory Visit (INDEPENDENT_AMBULATORY_CARE_PROVIDER_SITE_OTHER): Admitting: Family

## 2024-02-10 ENCOUNTER — Encounter: Payer: Self-pay | Admitting: Family

## 2024-02-10 ENCOUNTER — Ambulatory Visit: Payer: Self-pay | Admitting: Family

## 2024-02-10 VITALS — BP 114/80 | HR 93 | Temp 98.0°F | Resp 16 | Ht 61.5 in | Wt 180.2 lb

## 2024-02-10 DIAGNOSIS — R32 Unspecified urinary incontinence: Secondary | ICD-10-CM | POA: Diagnosis not present

## 2024-02-10 DIAGNOSIS — T63444S Toxic effect of venom of bees, undetermined, sequela: Secondary | ICD-10-CM

## 2024-02-10 DIAGNOSIS — M25562 Pain in left knee: Secondary | ICD-10-CM

## 2024-02-10 DIAGNOSIS — R55 Syncope and collapse: Secondary | ICD-10-CM

## 2024-02-10 DIAGNOSIS — M79644 Pain in right finger(s): Secondary | ICD-10-CM

## 2024-02-10 DIAGNOSIS — M545 Low back pain, unspecified: Secondary | ICD-10-CM

## 2024-02-10 DIAGNOSIS — N3090 Cystitis, unspecified without hematuria: Secondary | ICD-10-CM | POA: Diagnosis not present

## 2024-02-10 LAB — POCT URINALYSIS DIP (CLINITEK)
Bilirubin, UA: NEGATIVE
Glucose, UA: NEGATIVE mg/dL
Leukocytes, UA: NEGATIVE
Nitrite, UA: NEGATIVE
POC PROTEIN,UA: NEGATIVE
Spec Grav, UA: 1.025 (ref 1.010–1.025)
Urobilinogen, UA: 0.2 U/dL
pH, UA: 6 (ref 5.0–8.0)

## 2024-02-10 MED ORDER — OXYBUTYNIN CHLORIDE ER 5 MG PO TB24: 5.0000 mg | ORAL_TABLET | Freq: Every day | ORAL | 0 refills | Status: AC

## 2024-02-10 MED ORDER — KETOROLAC TROMETHAMINE 30 MG/ML IJ SOLN
30.0000 mg | Freq: Once | INTRAMUSCULAR | Status: AC
  Administered 2024-02-10: 30 mg via INTRAMUSCULAR

## 2024-02-10 NOTE — Progress Notes (Signed)
 Patient ID: Victoria Holmes, female    DOB: September 17, 1980  MRN: [id number]  CC: Emergency Department Follow-Up  Subjective: Victoria Holmes is a 43 y.o. female who presents for Emergency Department follow-up.   Her concerns today include:  - Patient recently seen on 02/04/2024 - 02/05/2024 (3 hours) at [facility] Emergency Department at [facility] for low back pain and cystitis. Today states since then lower back pain persisting. Denies recent trauma/injury and red flag symptoms. States left forearm discomfort (itching) persisting from bee sting. Denies red flag symptoms. States she has not completed prescribed steroid as of present for bee sting management. States she is still taking antibiotics for urinary symptoms.   - Urinary incontinence. States syncopal episode (see below) began because she was holding her urine trying to train her bladder and she began coughing which resulted in her passing out. - States 2 months ago she passed out for what she suspects was less than 15 seconds. States she did not seek immediate medical evaluation because she does not prefer to go to [facility]. States the staff and [facility] are unprofessional and she always has a long wait time. States she thinks she hit her head when she passed out. Also, having right thumb pain and left knee pain. Denies red flag symptoms.   Patient Active Problem List   Diagnosis Date Noted   Schizoaffective disorder (HCC) 05/08/2022   Controlled diabetes mellitus type 2 with complications (HCC) 03/17/2021   Rheumatoid arthritis (HCC)    Raynauds disease    Precordial pain 05/18/2020   Essential hypertension 04/13/2020   Palpitations 04/13/2020   Carpopedal spasm 02/01/2015   Epilepsy (HCC) 11/24/2014   Drug allergy , multiple 07/17/2014     Current Outpatient Medications on File Prior to Visit  Medication Sig Dispense Refill   Accu-Chek Softclix Lancets lancets Use to check blood sugar three times  daily. E11.65 100 each 3   amLODipine  (NORVASC ) 5 MG tablet Take 1 tablet (5 mg total) by mouth daily. 90 tablet 1   atorvastatin  (LIPITOR) 20 MG tablet Take 1 tablet (20 mg total) by mouth daily. 90 tablet 1   busPIRone  (BUSPAR ) 5 MG tablet Take 1 tablet (5 mg total) by mouth 3 (three) times daily. 90 tablet 2   cephALEXin  (KEFLEX ) 500 MG capsule Take 1 capsule (500 mg total) by mouth 4 (four) times daily. 28 capsule 0   DULoxetine  (CYMBALTA ) 60 MG capsule Take 1 capsule (60 mg total) by mouth daily. 90 capsule 1   EPINEPHRINE  0.3 mg/0.3 mL IJ SOAJ injection Inject 0.3 mg into the muscle as needed for anaphylaxis. 2 each 2   estradiol (ESTRACE) 0.1 MG/GM vaginal cream 1 gram Vaginal Once a day for 2 weeks then 2 times a week 90 days for 90     hydroxychloroquine  (PLAQUENIL ) 200 MG tablet Take 200 mg by mouth 2 (two) times daily.     ibuprofen (ADVIL,MOTRIN) 200 MG tablet Take 200 mg by mouth every 6 (six) hours as needed for fever, headache, mild pain, moderate pain or cramping.     metFORMIN  (GLUCOPHAGE ) 500 MG tablet Take 1 tablet (500 mg total) by mouth daily with breakfast. 180 tablet 1   ondansetron  (ZOFRAN ) 4 MG tablet Take 1 tablet (4 mg total) by mouth every 6 (six) hours as needed for nausea or vomiting. 12 tablet 0   OZEMPIC , 0.25 OR 0.5 MG/DOSE, 2 MG/3ML SOPN Inject 0.5 mg into the skin once a week.  pantoprazole  (PROTONIX ) 40 MG tablet Take 1 tablet (40 mg total) by mouth daily. 90 tablet 1   pregabalin  (LYRICA ) 100 MG capsule Take 1 capsule (100 mg total) by mouth 2 (two) times daily. 60 capsule 3   QUEtiapine  Fumarate 150 MG TABS Take 150 mg by mouth at bedtime. 30 tablet 2   Semaglutide ,0.25 or 0.5MG /DOS, (OZEMPIC , 0.25 OR 0.5 MG/DOSE,) 2 MG/1.5ML SOPN Inject 0.25 mg into the skin once a week. For 4 weeks then increase to 0.5mg  2 mL 0   Semaglutide ,0.25 or 0.5MG /DOS, (OZEMPIC , 0.25 OR 0.5 MG/DOSE,) 2 MG/3ML SOPN Inject 0.5 mg into the skin once a week. 3 mL 6   spironolactone  (ALDACTONE) 50 MG tablet Take 50 mg by mouth daily.     tiZANidine  (ZANAFLEX ) 4 MG tablet TAKE ONE TABLET BY MOUTH EVERY EIGHT HOURS AS NEEDED muscle SPASMS 270 tablet 0   valACYclovir (VALTREX) 1000 MG tablet Take 1,000 mg by mouth daily.     Vitamin D , Ergocalciferol , (DRISDOL ) 1.25 MG (50000 UNIT) CAPS capsule TAKE ONE CAPSULE BY MOUTH Once weekly 12 capsule 0   doxycycline (VIBRAMYCIN) 100 MG capsule Take 100 mg by mouth 2 (two) times daily. (Patient not taking: Reported on 02/10/2024)     fluconazole  (DIFLUCAN ) 150 MG tablet Take 1 tablet (150 mg total) by mouth daily. (Patient not taking: Reported on 02/10/2024) 7 tablet 0   metroNIDAZOLE  (METROGEL ) 0.75 % vaginal gel Place 1 Applicatorful vaginally 2 (two) times daily. (Patient not taking: Reported on 10/09/2023) 70 g 0   propranolol  (INDERAL ) 20 MG tablet Take 2 tablets (40 mg total) by mouth 2 (two) times daily. (Patient not taking: Reported on 10/09/2023) 360 tablet 1   No current facility-administered medications on file prior to visit.    Allergies  Allergen Reactions   Amoxicillin  Hives and Rash    Has patient had a PCN reaction causing immediate rash, facial/tongue/throat swelling, SOB or lightheadedness with hypotension: Yes Has patient had a PCN reaction causing severe rash involving mucus membranes or skin necrosis: Yes Has patient had a PCN reaction that required hospitalization: No Has patient had a PCN reaction occurring within the last 10 years: No If all of the above answers are NO, then may proceed with Cephalosporin use.    Azithromycin Shortness Of Breath and Rash   Darvocet [Propoxyphene N-Acetaminophen ] Shortness Of Breath and Rash   Grapeseed Extract [Nutritional Supplements] Shortness Of Breath, Swelling and Rash    GRAPES   Kiwi Extract Shortness Of Breath, Swelling and Rash   Latex Shortness Of Breath   Macrolides And Ketolides Shortness Of Breath and Rash   Morphine And Codeine Shortness Of Breath, Rash and  Other (See Comments)    MUSCLE RIGIDITY   Nutritional Supplements Rash, Shortness Of Breath and Swelling    GRAPES   Sulfa Antibiotics Shortness Of Breath    MUSCLE RIGIDITY   Sulfamethoxazole-Trimethoprim Anaphylaxis   Klonopin [Clonazepam] Rash and Other (See Comments)    HOMICIDAL THOUGHTS   Penicillins Itching and Swelling    Swelling of mouth Has patient had a PCN reaction causing immediate rash, facial/tongue/throat swelling, SOB or lightheadedness with hypotension: Yes Has patient had a PCN reaction causing severe rash involving mucus membranes or skin necrosis: No Has patient had a PCN reaction that required hospitalization: No Has patient had a PCN reaction occurring within the last 10 years: No If all of the above answers are NO, then may proceed with Cephalosporin use.    Zoloft [Sertraline Hcl] Other (  See Comments)    TARDIVE DYSKINESIA   Contrast Media [Iodinated Contrast Media] Swelling    Pt CAN NOT have drinking contrast; She CAN have IV contrast; when pt drinks water soluble contrast, she develops shallow breathing and hives along with Herpes Type 2 around her eyes   Flagyl  [Metronidazole ] Nausea And Vomiting   Other Other (See Comments)   Adhesive [Tape] Rash   Concerta [Methylphenidate] Nausea Only   Lexapro [Escitalopram Oxalate] Diarrhea and Rash   Lortab [Hydrocodone-Acetaminophen ] Rash    Social History   Socioeconomic History   Marital status: Single    Spouse name: Not on file   Number of children: 1   Years of education: Not on file   Highest education level: Not on file  Occupational History   Occupation: Diasbled  Tobacco Use   Smoking status: Former    Current packs/day: 0.00    Average packs/day: 0.3 packs/day for 3.0 years (0.8 ttl pk-yrs)    Types: Cigarettes    Start date: 01/2016    Quit date: 01/2019    Years since quitting: 5.0   Smokeless tobacco: Never   Tobacco comments:    3 cig./day  Vaping Use   Vaping status: Never Used   Substance and Sexual Activity   Alcohol  use: Yes    Alcohol /week: 0.0 standard drinks of alcohol     Comment: occasionally   Drug use: Yes    Types: Marijuana   Sexual activity: Yes    Birth control/protection: None  Other Topics Concern   Not on file  Social History Narrative   Not on file   Social Drivers of Health   Financial Resource Strain: Low Risk  (08/12/2023)   Overall Financial Resource Strain (CARDIA)    Difficulty of Paying Living Expenses: Not hard at all  Food Insecurity: Patient Declined (08/12/2023)   Hunger Vital Sign    Worried About Running Out of Food in the Last Year: Patient declined    Ran Out of Food in the Last Year: Patient declined  Transportation Needs: Patient Declined (08/12/2023)   PRAPARE - Administrator, Civil Service (Medical): Patient declined    Lack of Transportation (Non-Medical): Patient declined  Physical Activity: Sufficiently Active (08/12/2023)   Exercise Vital Sign    Days of Exercise per Week: 4 days    Minutes of Exercise per Session: 40 min  Stress: Stress Concern Present (08/12/2023)   [employer] of Occupational Health - Occupational Stress Questionnaire    Feeling of Stress : Very much  Social Connections: Unknown (08/12/2023)   Social Connection and Isolation Panel    Frequency of Communication with Friends and Family: Patient declined    Frequency of Social Gatherings with Friends and Family: Patient declined    Attends Religious Services: Patient declined    Database administrator or Organizations: No    Attends Banker Meetings: Never    Marital Status: Patient declined  Catering manager Violence: Not At Risk (08/12/2023)   Humiliation, Afraid, Rape, and Kick questionnaire    Fear of Current or Ex-Partner: No    Emotionally Abused: No    Physically Abused: No    Sexually Abused: No    Family History  Problem Relation Age of Onset   Colon cancer Father    Heart attack Maternal Grandmother         late 40's   Arthritis Maternal Grandfather    Cancer Maternal Grandfather        lung  Diabetes Paternal Grandmother    Hypertension Paternal Grandmother    Kidney disease Paternal Grandmother    Lupus Other    Pancreatic cancer Neg Hx    Stomach cancer Neg Hx    Liver cancer Neg Hx    Esophageal cancer Neg Hx     Past Surgical History:  Procedure Laterality Date   ABDOMINAL HYSTERECTOMY  6 yrs. ago   complete   CHOLECYSTECTOMY  6-8 yrs. ago   FOOT SURGERY  05/2011   right great toe   TONSILLECTOMY  04/22/2012   Procedure: TONSILLECTOMY;  Surgeon: Lonni FORBES Angle, MD;  Location: [facility];  Service: ENT;  Laterality: N/A;    ROS: Review of Systems Negative except as stated above  PHYSICAL EXAM: BP 114/80   Pulse 93   Temp 98 F (36.7 C) (Oral)   Resp 16   Ht 5' 1.5 (1.562 m)   Wt 180 lb 3.2 oz (81.7 kg)   SpO2 97%   BMI 33.50 kg/m   Physical Exam HENT:     Head: Normocephalic and atraumatic.     Nose: Nose normal.     Mouth/Throat:     Mouth: Mucous membranes are moist.     Pharynx: Oropharynx is clear.  Eyes:     Extraocular Movements: Extraocular movements intact.     Conjunctiva/sclera: Conjunctivae normal.     Pupils: Pupils are equal, round, and reactive to light.  Cardiovascular:     Rate and Rhythm: Normal rate and regular rhythm.     Pulses: Normal pulses.     Heart sounds: Normal heart sounds.  Pulmonary:     Effort: Pulmonary effort is normal.     Breath sounds: Normal breath sounds.  Musculoskeletal:        General: Normal range of motion.     Right shoulder: Normal.     Left shoulder: Normal.     Right upper arm: Normal.     Left upper arm: Normal.     Right elbow: Normal.     Left elbow: Normal.     Right forearm: Normal.     Left forearm: Normal.     Right wrist: Normal.     Left wrist: Normal.     Right hand: Normal.     Left hand: Normal.     Cervical back: Normal, normal range of motion and neck  supple.     Thoracic back: Normal.     Lumbar back: Normal.     Right hip: Normal.     Left hip: Normal.     Right upper leg: Normal.     Left upper leg: Normal.     Right knee: Normal.     Left knee: Normal.     Right lower leg: Normal.     Left lower leg: Normal.     Right ankle: Normal.     Left ankle: Normal.     Right foot: Normal.     Left foot: Normal.  Neurological:     General: No focal deficit present.     Mental Status: She is alert and oriented to person, place, and time.  Psychiatric:        Mood and Affect: Mood normal.        Behavior: Behavior normal.     ASSESSMENT AND PLAN: 1. Low back pain, unspecified back pain laterality, unspecified chronicity, unspecified whether sciatica present (Primary) - Ketorolac  administered in office.  - Referral to Orthopedic Surgery for evaluation/management. -  ketorolac  (TORADOL ) 30 MG/ML injection 30 mg - Ambulatory referral to Orthopedic Surgery  2. Left knee pain, unspecified chronicity - Ketorolac  administered in office.  - Diagnostic xray left knee for evaluation. - Referral to Orthopedic Surgery for evaluation/management. - ketorolac  (TORADOL ) 30 MG/ML injection 30 mg - DG Knee Complete 4 Views Left; Future - Ambulatory referral to Orthopedic Surgery  3. Pain of right thumb - Ketorolac  administered in office.  - Diagnostic xray right hand for evaluation. - Referral to Orthopedic Surgery for evaluation/management. - ketorolac  (TORADOL ) 30 MG/ML injection 30 mg - DG Hand Complete Right; Future - Ambulatory referral to Orthopedic Surgery  4. Cystitis 5. Urinary incontinence, unspecified type - Oybutynin as prescribed. Counseled on medication adherence/adverse effects.  - Routine screening.  - Referral to Urology for evaluation/management. - oxybutynin  (DITROPAN -XL) 5 MG 24 hr tablet; Take 1 tablet (5 mg total) by mouth at bedtime.  Dispense: 90 tablet; Refill: 0 - Ambulatory referral to Urology - POCT  URINALYSIS DIP (CLINITEK); Future - Urine Culture - POCT URINALYSIS DIP (CLINITEK)  6. Syncope, unspecified syncope type - CT Head WO Contrast for evaluation. - CT HEAD WO CONTRAST ( ); Future  7. Bee sting, undetermined intent, sequela - Continue present management. - Follow-up with primary provider as scheduled.   Patient was given the opportunity to ask questions.  Patient verbalized understanding of the plan and was able to repeat key elements of the plan. Patient was given clear instructions to go to Emergency Department or return to medical center if symptoms don't improve, worsen, or new problems develop.The patient verbalized understanding.   Orders Placed This Encounter  Procedures   Urine Culture   CT HEAD WO CONTRAST ( )   DG Hand Complete Right   DG Knee Complete 4 Views Left   Ambulatory referral to Urology   Ambulatory referral to Orthopedic Surgery   POCT URINALYSIS DIP (CLINITEK)     Requested Prescriptions   Signed Prescriptions Disp Refills   oxybutynin  (DITROPAN -XL) 5 MG 24 hr tablet 90 tablet 0    Sig: Take 1 tablet (5 mg total) by mouth at bedtime.    Return in 1 week (on 02/17/2024) for Follow-Up or next available with Enobong Newlin, MD.  Greig JINNY Drones, NP

## 2024-02-10 NOTE — Progress Notes (Signed)
 Patient has been feeling off since bee sting had a  bag allergic reaction, patient was coughing and passed out and when she came through she at used the bathroom on herself.  Patient hand a bruise on her left leg and a knot on her hand.  Patient locks up when she has a allergic reaction. Patient has a lot of different health issues,   patient has had lower back pain

## 2024-02-12 DIAGNOSIS — Z79899 Other long term (current) drug therapy: Secondary | ICD-10-CM | POA: Diagnosis not present

## 2024-02-12 DIAGNOSIS — M329 Systemic lupus erythematosus, unspecified: Secondary | ICD-10-CM | POA: Diagnosis not present

## 2024-02-12 DIAGNOSIS — G43909 Migraine, unspecified, not intractable, without status migrainosus: Secondary | ICD-10-CM | POA: Diagnosis not present

## 2024-02-12 DIAGNOSIS — H40053 Ocular hypertension, bilateral: Secondary | ICD-10-CM | POA: Diagnosis not present

## 2024-02-12 DIAGNOSIS — E119 Type 2 diabetes mellitus without complications: Secondary | ICD-10-CM | POA: Diagnosis not present

## 2024-02-12 LAB — URINE CULTURE

## 2024-02-12 LAB — HM DIABETES EYE EXAM

## 2024-02-13 ENCOUNTER — Encounter: Payer: Self-pay | Admitting: Family Medicine

## 2024-02-13 ENCOUNTER — Ambulatory Visit
Admission: RE | Admit: 2024-02-13 | Discharge: 2024-02-13 | Disposition: A | Discharge status: Home / Self Care | Source: Ambulatory Visit | Attending: Family | Admitting: Family

## 2024-02-13 DIAGNOSIS — R55 Syncope and collapse: Secondary | ICD-10-CM

## 2024-02-13 DIAGNOSIS — R519 Headache, unspecified: Secondary | ICD-10-CM | POA: Diagnosis not present

## 2024-02-19 ENCOUNTER — Ambulatory Visit: Admitting: Family

## 2024-02-28 ENCOUNTER — Other Ambulatory Visit (INDEPENDENT_AMBULATORY_CARE_PROVIDER_SITE_OTHER): Payer: Self-pay

## 2024-02-28 ENCOUNTER — Encounter: Payer: Self-pay | Admitting: Sports Medicine

## 2024-02-28 ENCOUNTER — Ambulatory Visit (INDEPENDENT_AMBULATORY_CARE_PROVIDER_SITE_OTHER): Admitting: Sports Medicine

## 2024-02-28 DIAGNOSIS — Q7649 Other congenital malformations of spine, not associated with scoliosis: Secondary | ICD-10-CM | POA: Diagnosis not present

## 2024-02-28 DIAGNOSIS — G8929 Other chronic pain: Secondary | ICD-10-CM

## 2024-02-28 DIAGNOSIS — M545 Low back pain, unspecified: Secondary | ICD-10-CM | POA: Diagnosis not present

## 2024-02-28 DIAGNOSIS — M069 Rheumatoid arthritis, unspecified: Secondary | ICD-10-CM | POA: Diagnosis not present

## 2024-02-28 DIAGNOSIS — M797 Fibromyalgia: Secondary | ICD-10-CM | POA: Diagnosis not present

## 2024-02-28 DIAGNOSIS — M79644 Pain in right finger(s): Secondary | ICD-10-CM

## 2024-02-28 MED ORDER — CELECOXIB 100 MG PO CAPS: 100.0000 mg | ORAL_CAPSULE | Freq: Two times a day (BID) | ORAL | 0 refills | Status: AC

## 2024-02-28 NOTE — Progress Notes (Signed)
 Victoria Holmes - 43 y.o. female MRN [id number]  Date of birth: [birth year]  Office Visit Note: Visit Date: 02/28/2024 PCP: Delbert Clam, MD Referred by: Lorren Greig PARAS, NP  Subjective: Chief Complaint  Patient presents with   Lower Back - Pain   Right Hand - Pain   HPI: Victoria Holmes is a pleasant 43 y.o. female who presents today for chronic low back pain and right thumb pain.  Chronic low back pain for over the last year.  She did have a fall about 3 months ago that seem to make the back pain worse.  She feels the pain in the low back across the entire back bilaterally.  She occasionally will have tingling sensations down the leg/feet but not a specific pattern.  She feels that all of her joints including her back and her finger joints feel swollen and like they need to pop.  She has had chronic right thumb pain.  In the past she has been told she has trigger thumb/trigger finger.  She has not had any injections or intervention for the thumb.  She feels this more at the DIP joint.  She does have pain when striking her lighter.  She has a notable past medical history of rheumatoid arthritis, previously on Plaquenil  but was removed from this by her medical provider, lupus, fibromyalgia.  She has trialed ibuprofen, Aleve, oral prednisone , Plaquenil , and is currently on Lyrica  100 mg twice daily for her neuropathy which has not helped her to the great extent.  Pertinent ROS were reviewed with the patient and found to be negative unless otherwise specified above in HPI.   Assessment & Plan: Visit Diagnoses:  1. Chronic bilateral low back pain without sciatica   2. Lumbarization, vertebra   3. Chronic pain of right thumb   4. Rheumatoid arthritis involving multiple sites, unspecified whether rheumatoid factor present (HCC)   5. Fibromyalgia    Plan: Impression is chronic low back pain with some evidence of right-sided SI joint dysfunction.  She does have transitional lumbosacral  anatomy, but no significant arthritic change.  I would like to get her started in formalized physical therapy to work on strengthening of the low back and the posterior chain, referral sent to [facility] today.  [name] does have multiple joint arthralgias with pain in the thumb but all of her finger joints.  She does have a previous history of SLE and rheumatoid arthritis but currently is not managed on any biologic medication.  I would like to trial her on Celebrex  100 mg once to twice daily as needed.  If all of her joints seem to be flared, it may be smart for her to revisit follow-up with rheumatology.  She does have some tingling in the legs but I believe this is more neuropathy base as opposed to true lumbar radiculopathy.  She may continue her Lyrica  100 mg twice daily.  I would like to see her back in about 6 weeks after starting physical therapy to follow-up on the above.  Follow-up: Return for f/u in 6 weeks after staring PT.   Meds & Orders:  Meds ordered this encounter  Medications   celecoxib  (CELEBREX ) 100 MG capsule    Sig: Take 1 capsule (100 mg total) by mouth 2 (two) times daily.    Dispense:  60 capsule    Refill:  0    Orders Placed This Encounter  Procedures   XR Lumbar Spine Complete   XR Finger Thumb Right  Ambulatory referral to Physical Therapy     Procedures: No procedures performed      Clinical History: No specialty comments available.  She reports that she quit smoking about 5 years ago. Her smoking use included cigarettes. She started smoking about 8 years ago. She has a 0.8 pack-year smoking history. She has never used smokeless tobacco.  Recent Labs    04/10/23 1608 10/09/23 0959  HGBA1C 6.3 6.7    Objective:    Physical Exam  Gen: Well-appearing, in no acute distress; non-toxic CV: Well-perfused. Warm.  Resp: Breathing unlabored on room air; no wheezing. Psych: Fluid speech in conversation; appropriate affect; normal thought process  Ortho  Exam - Right thumb: There is pain with palpation on the radial and ulnar side of the DIP joint.  There is no active triggering with active or passive range of motion about the thumb.   - Lumbar: No specific midline spinous process tenderness.  There is both right and left sided lumbar paraspinal hypertonicity.  Positive TTP and Fortin's point test over the right SI joint.  Negative SLR.  There is good range of motion with flexion and extension with mild pain at end range bilaterally.  Imaging: XR Lumbar Spine Complete Result Date: 02/28/2024 Complete view of the lumbar spine including AP, lateral, flexion/extension views were ordered and reviewed by myself today.  X-rays demonstrate preserved intervertebral disc spaces.  There is no instability with flexion/extension views.  There is lumbarization of the spine with an extra L6 vertebrae.  XR Finger Thumb Right Result Date: 02/28/2024 3 views of the thumb including AP, lateral and oblique film were ordered and reviewed by myself.  There is a small calcification within the DIP joint, possible chondrocalcinosis versus previous injury.  There is no acute fracture or significant arthritic change noted.   Past Medical/Family/Surgical/Social History: Medications & Allergies reviewed per EMR, new medications updated. Patient Active Problem List   Diagnosis Date Noted   Schizoaffective disorder (HCC) 05/08/2022   Controlled diabetes mellitus type 2 with complications (HCC) 03/17/2021   Rheumatoid arthritis (HCC)    Raynauds disease    Precordial pain 05/18/2020   Essential hypertension 04/13/2020   Palpitations 04/13/2020   Carpopedal spasm 02/01/2015   Epilepsy (HCC) 11/24/2014   Drug allergy , multiple 07/17/2014   Past Medical History:  Diagnosis Date   Anemia    no current med.   Anxiety    Chronic tonsillitis 03/2012   Depression    Diabetes mellitus    diet-controlled   Fibrocystic breast disease    Headache(784.0)    migraines    History of endometriosis    Hyperlipidemia    Hypertension    under control, has been on med. x 1 yr.   Lupus    Lupus    Neuritis    Panic attacks    Raynauds disease    Rheumatoid arthritis(714.0)    Schizoaffective disorder (HCC)    Seizures (HCC)    last seizure > 1 yr. ago   Sinusitis    Sleep disturbance    Thyroid  nodule    Family History  Problem Relation Age of Onset   Colon cancer Father    Heart attack Maternal Grandmother        late 59's   Arthritis Maternal Grandfather    Cancer Maternal Grandfather        lung   Diabetes Paternal Grandmother    Hypertension Paternal Grandmother    Kidney disease Paternal Grandmother    Lupus  Other    Pancreatic cancer Neg Hx    Stomach cancer Neg Hx    Liver cancer Neg Hx    Esophageal cancer Neg Hx    Past Surgical History:  Procedure Laterality Date   ABDOMINAL HYSTERECTOMY  6 yrs. ago   complete   CHOLECYSTECTOMY  6-8 yrs. ago   FOOT SURGERY  05/2011   right great toe   TONSILLECTOMY  04/22/2012   Procedure: TONSILLECTOMY;  Surgeon: Lonni FORBES Angle, MD;  Location: [facility];  Service: ENT;  Laterality: N/A;   Social History   Occupational History   Occupation: Diasbled  Tobacco Use   Smoking status: Former    Current packs/day: 0.00    Average packs/day: 0.3 packs/day for 3.0 years (0.8 ttl pk-yrs)    Types: Cigarettes    Start date: 01/2016    Quit date: 01/2019    Years since quitting: 5.1   Smokeless tobacco: Never   Tobacco comments:    3 cig./day  Vaping Use   Vaping status: Never Used  Substance and Sexual Activity   Alcohol  use: Yes    Alcohol /week: 0.0 standard drinks of alcohol     Comment: occasionally   Drug use: Yes    Types: Marijuana   Sexual activity: Yes    Birth control/protection: None

## 2024-02-28 NOTE — Progress Notes (Signed)
 Patient says that she has had low back pain for over a year, and had a fall about 3 months ago that made it worse. She says that she feels it across the entire lower back, and one side is not worse than the other. She does have pain that goes down the legs and stops at the feet, as well as tingling occasionally.   Patient has had pain in the right thumb, which she says she has been diagnosed with trigger finger in the past. She does not remember having injections for the thumb. She says that her pain is along the sides of the right thumb, and she notices it especially when trying to use her lighter.

## 2024-03-01 ENCOUNTER — Other Ambulatory Visit: Payer: Self-pay | Admitting: Medical Genetics

## 2024-03-04 ENCOUNTER — Ambulatory Visit (INDEPENDENT_AMBULATORY_CARE_PROVIDER_SITE_OTHER): Admitting: Family

## 2024-03-04 ENCOUNTER — Other Ambulatory Visit: Payer: Self-pay

## 2024-03-04 VITALS — BP 125/85 | HR 82 | Temp 98.1°F | Resp 16 | Ht 61.5 in | Wt 184.4 lb

## 2024-03-04 DIAGNOSIS — M25562 Pain in left knee: Secondary | ICD-10-CM

## 2024-03-04 DIAGNOSIS — M545 Low back pain, unspecified: Secondary | ICD-10-CM

## 2024-03-04 DIAGNOSIS — M79644 Pain in right finger(s): Secondary | ICD-10-CM | POA: Diagnosis not present

## 2024-03-04 DIAGNOSIS — Z0100 Encounter for examination of eyes and vision without abnormal findings: Secondary | ICD-10-CM | POA: Diagnosis not present

## 2024-03-04 DIAGNOSIS — Z006 Encounter for examination for normal comparison and control in clinical research program: Secondary | ICD-10-CM

## 2024-03-04 NOTE — Progress Notes (Signed)
 One week follow up

## 2024-03-04 NOTE — Progress Notes (Signed)
One week follow up

## 2024-03-04 NOTE — Progress Notes (Signed)
 Patient ID: Victoria Holmes, female    DOB: 1980-12-21  MRN: [id number]  CC: Follow-Up  Subjective: Victoria Holmes is a 43 y.o. female who presents for follow-up.   Her concerns today include:  - Patient states she is unsure of the reason for today's appointment. Patient confirms she does not have any present issues/concerns for discussion today.  - Established with Orthopedics, Physical Therapy, and [facility] for chronic orthopedic-related conditions management.  Patient Active Problem List   Diagnosis Date Noted   Schizoaffective disorder (HCC) 05/08/2022   Controlled diabetes mellitus type 2 with complications (HCC) 03/17/2021   Rheumatoid arthritis (HCC)    Raynauds disease    Precordial pain 05/18/2020   Essential hypertension 04/13/2020   Palpitations 04/13/2020   Carpopedal spasm 02/01/2015   Epilepsy (HCC) 11/24/2014   Drug allergy , multiple 07/17/2014     Current Outpatient Medications on File Prior to Visit  Medication Sig Dispense Refill   Accu-Chek Softclix Lancets lancets Use to check blood sugar three times daily. E11.65 100 each 3   amLODipine  (NORVASC ) 5 MG tablet Take 1 tablet (5 mg total) by mouth daily. 90 tablet 1   atorvastatin  (LIPITOR) 20 MG tablet Take 1 tablet (20 mg total) by mouth daily. 90 tablet 1   busPIRone  (BUSPAR ) 5 MG tablet Take 1 tablet (5 mg total) by mouth 3 (three) times daily. 90 tablet 2   celecoxib  (CELEBREX ) 100 MG capsule Take 1 capsule (100 mg total) by mouth 2 (two) times daily. 60 capsule 0   DULoxetine  (CYMBALTA ) 60 MG capsule Take 1 capsule (60 mg total) by mouth daily. 90 capsule 1   EPINEPHRINE  0.3 mg/0.3 mL IJ SOAJ injection Inject 0.3 mg into the muscle as needed for anaphylaxis. 2 each 2   estradiol (ESTRACE) 0.1 MG/GM vaginal cream 1 gram Vaginal Once a day for 2 weeks then 2 times a week 90 days for 90     ibuprofen (ADVIL,MOTRIN) 200 MG tablet Take 200 mg by mouth every 6 (six) hours as needed for fever, headache, mild pain,  moderate pain or cramping.     metFORMIN  (GLUCOPHAGE ) 500 MG tablet Take 1 tablet (500 mg total) by mouth daily with breakfast. 180 tablet 1   ondansetron  (ZOFRAN ) 4 MG tablet Take 1 tablet (4 mg total) by mouth every 6 (six) hours as needed for nausea or vomiting. 12 tablet 0   oxybutynin  (DITROPAN -XL) 5 MG 24 hr tablet Take 1 tablet (5 mg total) by mouth at bedtime. 90 tablet 0   OZEMPIC , 0.25 OR 0.5 MG/DOSE, 2 MG/3ML SOPN Inject 0.5 mg into the skin once a week.     pantoprazole  (PROTONIX ) 40 MG tablet Take 1 tablet (40 mg total) by mouth daily. 90 tablet 1   pregabalin  (LYRICA ) 100 MG capsule Take 1 capsule (100 mg total) by mouth 2 (two) times daily. 60 capsule 3   QUEtiapine  Fumarate 150 MG TABS Take 150 mg by mouth at bedtime. 30 tablet 2   Semaglutide ,0.25 or 0.5MG /DOS, (OZEMPIC , 0.25 OR 0.5 MG/DOSE,) 2 MG/1.5ML SOPN Inject 0.25 mg into the skin once a week. For 4 weeks then increase to 0.5mg  2 mL 0   Semaglutide ,0.25 or 0.5MG /DOS, (OZEMPIC , 0.25 OR 0.5 MG/DOSE,) 2 MG/3ML SOPN Inject 0.5 mg into the skin once a week. 3 mL 6   spironolactone (ALDACTONE) 50 MG tablet Take 50 mg by mouth daily.     tiZANidine  (ZANAFLEX ) 4 MG tablet TAKE ONE TABLET BY MOUTH EVERY EIGHT HOURS AS NEEDED  muscle SPASMS 270 tablet 0   valACYclovir (VALTREX) 1000 MG tablet Take 1,000 mg by mouth daily.     cephALEXin  (KEFLEX ) 500 MG capsule Take 1 capsule (500 mg total) by mouth 4 (four) times daily. 28 capsule 0   doxycycline (VIBRAMYCIN) 100 MG capsule Take 100 mg by mouth 2 (two) times daily. (Patient not taking: Reported on 02/10/2024)     fluconazole  (DIFLUCAN ) 150 MG tablet Take 1 tablet (150 mg total) by mouth daily. (Patient not taking: Reported on 02/10/2024) 7 tablet 0   hydroxychloroquine  (PLAQUENIL ) 200 MG tablet Take 200 mg by mouth 2 (two) times daily.     metroNIDAZOLE  (METROGEL ) 0.75 % vaginal gel Place 1 Applicatorful vaginally 2 (two) times daily. (Patient not taking: Reported on 10/09/2023) 70 g 0    propranolol  (INDERAL ) 20 MG tablet Take 2 tablets (40 mg total) by mouth 2 (two) times daily. (Patient not taking: Reported on 10/09/2023) 360 tablet 1   Vitamin D , Ergocalciferol , (DRISDOL ) 1.25 MG (50000 UNIT) CAPS capsule TAKE ONE CAPSULE BY MOUTH Once weekly 12 capsule 0   No current facility-administered medications on file prior to visit.    Allergies  Allergen Reactions   Amoxicillin  Hives and Rash    Has patient had a PCN reaction causing immediate rash, facial/tongue/throat swelling, SOB or lightheadedness with hypotension: Yes Has patient had a PCN reaction causing severe rash involving mucus membranes or skin necrosis: Yes Has patient had a PCN reaction that required hospitalization: No Has patient had a PCN reaction occurring within the last 10 years: No If all of the above answers are NO, then may proceed with Cephalosporin use.    Azithromycin Shortness Of Breath and Rash   Darvocet [Propoxyphene N-Acetaminophen ] Shortness Of Breath and Rash   Grapeseed Extract [Nutritional Supplements] Shortness Of Breath, Swelling and Rash    GRAPES   Kiwi Extract Shortness Of Breath, Swelling and Rash   Latex Shortness Of Breath   Macrolides And Ketolides Shortness Of Breath and Rash   Morphine And Codeine Shortness Of Breath, Rash and Other (See Comments)    MUSCLE RIGIDITY   Nutritional Supplements Rash, Shortness Of Breath and Swelling    GRAPES   Sulfa Antibiotics Shortness Of Breath    MUSCLE RIGIDITY   Sulfamethoxazole-Trimethoprim Anaphylaxis   Klonopin [Clonazepam] Rash and Other (See Comments)    HOMICIDAL THOUGHTS   Penicillins Itching and Swelling    Swelling of mouth Has patient had a PCN reaction causing immediate rash, facial/tongue/throat swelling, SOB or lightheadedness with hypotension: Yes Has patient had a PCN reaction causing severe rash involving mucus membranes or skin necrosis: No Has patient had a PCN reaction that required hospitalization: No Has patient  had a PCN reaction occurring within the last 10 years: No If all of the above answers are NO, then may proceed with Cephalosporin use.    Zoloft [Sertraline Hcl] Other (See Comments)    TARDIVE DYSKINESIA   Contrast Media [Iodinated Contrast Media] Swelling    Pt CAN NOT have drinking contrast; She CAN have IV contrast; when pt drinks water soluble contrast, she develops shallow breathing and hives along with Herpes Type 2 around her eyes   Flagyl  [Metronidazole ] Nausea And Vomiting   Other Other (See Comments)   Adhesive [Tape] Rash   Concerta [Methylphenidate] Nausea Only   Lexapro [Escitalopram Oxalate] Diarrhea and Rash   Lortab [Hydrocodone-Acetaminophen ] Rash    Social History   Socioeconomic History   Marital status: Single    Spouse name:  Not on file   Number of children: 1   Years of education: Not on file   Highest education level: Some college, no degree  Occupational History   Occupation: [employer]  Tobacco Use   Smoking status: Former    Current packs/day: 0.00    Average packs/day: 0.3 packs/day for 3.0 years (0.8 ttl pk-yrs)    Types: Cigarettes    Start date: 01/2016    Quit date: 01/2019    Years since quitting: 5.1   Smokeless tobacco: Never   Tobacco comments:    3 cig./day  Vaping Use   Vaping status: Never Used  Substance and Sexual Activity   Alcohol  use: Yes    Alcohol /week: 0.0 standard drinks of alcohol     Comment: occasionally   Drug use: Yes    Types: Marijuana   Sexual activity: Yes    Birth control/protection: None  Other Topics Concern   Not on file  Social History Narrative   Not on file   Social Drivers of Health   Financial Resource Strain: Low Risk  (03/04/2024)   Overall Financial Resource Strain (CARDIA)    Difficulty of Paying Living Expenses: Not hard at all  Food Insecurity: No Food Insecurity (03/04/2024)   Hunger Vital Sign    Worried About Running Out of Food in the Last Year: Never true    Ran Out of Food in the Last  Year: Never true  Transportation Needs: No Transportation Needs (03/04/2024)   PRAPARE - Administrator, Civil Service (Medical): No    Lack of Transportation (Non-Medical): No  Physical Activity: Insufficiently Active (03/04/2024)   Exercise Vital Sign    Days of Exercise per Week: 4 days    Minutes of Exercise per Session: 30 min  Stress: Stress Concern Present (03/04/2024)   [employer] of Occupational Health - Occupational Stress Questionnaire    Feeling of Stress: Very much  Social Connections: Moderately Isolated (03/04/2024)   Social Connection and Isolation Panel    Frequency of Communication with Friends and Family: Three times a week    Frequency of Social Gatherings with Friends and Family: Once a week    Attends Religious Services: More than 4 times per year    Active Member of [employer] or Organizations: No    Attends Engineer, structural: Not on file    Marital Status: Never married  Intimate Partner Violence: Not At Risk (08/12/2023)   Humiliation, Afraid, Rape, and Kick questionnaire    Fear of Current or Ex-Partner: No    Emotionally Abused: No    Physically Abused: No    Sexually Abused: No    Family History  Problem Relation Age of Onset   Colon cancer Father    Heart attack Maternal Grandmother        late 40's   Arthritis Maternal Grandfather    Cancer Maternal Grandfather        lung   Diabetes Paternal Grandmother    Hypertension Paternal Grandmother    Kidney disease Paternal Grandmother    Lupus Other    Pancreatic cancer Neg Hx    Stomach cancer Neg Hx    Liver cancer Neg Hx    Esophageal cancer Neg Hx     Past Surgical History:  Procedure Laterality Date   ABDOMINAL HYSTERECTOMY  6 yrs. ago   complete   CHOLECYSTECTOMY  6-8 yrs. ago   FOOT SURGERY  05/2011   right great toe   TONSILLECTOMY  04/22/2012  Procedure: TONSILLECTOMY;  Surgeon: Lonni FORBES Angle, MD;  Location: [facility];  Service: ENT;   Laterality: N/A;    ROS: Review of Systems Negative except as stated above  PHYSICAL EXAM: BP 125/85   Pulse 82   Temp 98.1 F (36.7 C) (Oral)   Resp 16   Ht 5' 1.5 (1.562 m)   Wt 184 lb 6.4 oz (83.6 kg)   SpO2 96%   BMI 34.28 kg/m   Physical Exam HENT:     Head: Normocephalic and atraumatic.     Nose: Nose normal.     Mouth/Throat:     Mouth: Mucous membranes are moist.     Pharynx: Oropharynx is clear.  Eyes:     Extraocular Movements: Extraocular movements intact.     Conjunctiva/sclera: Conjunctivae normal.     Pupils: Pupils are equal, round, and reactive to light.  Cardiovascular:     Rate and Rhythm: Normal rate and regular rhythm.     Pulses: Normal pulses.     Heart sounds: Normal heart sounds.  Pulmonary:     Effort: Pulmonary effort is normal.     Breath sounds: Normal breath sounds.  Musculoskeletal:        General: Normal range of motion.     Cervical back: Normal range of motion and neck supple.  Neurological:     General: No focal deficit present.     Mental Status: She is alert and oriented to person, place, and time.  Psychiatric:        Mood and Affect: Mood normal.        Behavior: Behavior normal.     ASSESSMENT AND PLAN: 1. Low back pain, unspecified back pain laterality, unspecified chronicity, unspecified whether sciatica present (Primary) 2. Left knee pain, unspecified chronicity 3. Pain of right thumb - Keep all scheduled appointments with Orthopedics, Physical Therapy, and [facility].   Patient was given the opportunity to ask questions.  Patient verbalized understanding of the plan and was able to repeat key elements of the plan. Patient was given clear instructions to go to Emergency Department or return to medical center if symptoms don't improve, worsen, or new problems develop.The patient verbalized understanding.  Return in 4 weeks (on 04/01/2024) for Follow-Up or next available with Enobong Newlin, MD.  Greig JINNY Drones, NP

## 2024-03-13 LAB — GENECONNECT MOLECULAR SCREEN: Genetic Analysis Overall Interpretation: NEGATIVE

## 2024-03-18 ENCOUNTER — Other Ambulatory Visit: Payer: Self-pay | Admitting: Obstetrics and Gynecology

## 2024-03-18 DIAGNOSIS — Z1231 Encounter for screening mammogram for malignant neoplasm of breast: Secondary | ICD-10-CM

## 2024-04-06 ENCOUNTER — Other Ambulatory Visit: Payer: Self-pay | Admitting: Family Medicine

## 2024-04-06 ENCOUNTER — Other Ambulatory Visit (HOSPITAL_COMMUNITY): Payer: Self-pay | Admitting: Psychiatry

## 2024-04-06 DIAGNOSIS — R202 Paresthesia of skin: Secondary | ICD-10-CM

## 2024-04-06 DIAGNOSIS — F250 Schizoaffective disorder, bipolar type: Secondary | ICD-10-CM

## 2024-04-06 DIAGNOSIS — F4312 Post-traumatic stress disorder, chronic: Secondary | ICD-10-CM

## 2024-04-10 ENCOUNTER — Ambulatory Visit: Admitting: Sports Medicine

## 2024-04-13 ENCOUNTER — Ambulatory Visit: Admitting: Family Medicine

## 2024-04-14 ENCOUNTER — Ambulatory Visit
Admission: RE | Admit: 2024-04-14 | Discharge: 2024-04-14 | Disposition: A | Discharge status: Home / Self Care | Source: Ambulatory Visit | Attending: Obstetrics and Gynecology | Admitting: Obstetrics and Gynecology

## 2024-04-14 DIAGNOSIS — Z1231 Encounter for screening mammogram for malignant neoplasm of breast: Secondary | ICD-10-CM

## 2024-04-23 ENCOUNTER — Encounter: Payer: Self-pay | Admitting: Sports Medicine

## 2024-04-23 ENCOUNTER — Ambulatory Visit: Admitting: Sports Medicine

## 2024-04-23 DIAGNOSIS — M545 Low back pain, unspecified: Secondary | ICD-10-CM | POA: Diagnosis not present

## 2024-04-23 DIAGNOSIS — G8929 Other chronic pain: Secondary | ICD-10-CM | POA: Diagnosis not present

## 2024-04-23 DIAGNOSIS — M797 Fibromyalgia: Secondary | ICD-10-CM | POA: Diagnosis not present

## 2024-04-23 DIAGNOSIS — M79641 Pain in right hand: Secondary | ICD-10-CM

## 2024-04-23 DIAGNOSIS — M79642 Pain in left hand: Secondary | ICD-10-CM | POA: Diagnosis not present

## 2024-04-23 DIAGNOSIS — Q7649 Other congenital malformations of spine, not associated with scoliosis: Secondary | ICD-10-CM

## 2024-04-23 NOTE — Progress Notes (Signed)
 Patient says that she has been in a lot of pain, especially over the last two weeks, in her low back. She says that today she is feeling a bit better, but feels that this two-week flare up has resolved some. She has not been to physical therapy as she has been caring for her father in hospice, but she has been very active with her own resistance bands, free weights, and pilates videos. She also walks the [place]acre property. She is here for follow-up today and hoping for some relief of pain while she is unable to make consistent appointments.

## 2024-04-23 NOTE — Progress Notes (Signed)
 Victoria Holmes - 43 y.o. female MRN [id number]  Date of birth: 1980/09/14  Office Visit Note: Visit Date: 04/23/2024 PCP: Delbert Clam, MD Referred by: Delbert Clam, MD  Subjective: Chief Complaint  Patient presents with   Lower Back - Follow-up   HPI: Victoria Holmes is a pleasant 43 y.o. female who presents today for follow-up of chronic low back pain, thumb, and b/l hand pain.  Dean was doing somewhat better in terms of her back pain and overall joint/hand pain until about 2 weeks ago when she feels her pain has flared back up.  She has been doing quite a bit for her family as she has a father who is in hospice and she is his primary caregiver, making meals and taking care of him at home.  Given this, she has been unable to make an appointment for physical therapy but she has been very dedicated to still doing exercises and Thera-Band exercises at home, watching videos.  She has tried increasing her walking around her father's property as well.  She does continue her Lyrica  100 mg twice daily.  She has been using Celebrex  100 mg twice daily which has been helpful although he recently unsure of the exact effect.  Pertinent ROS were reviewed with the patient and found to be negative unless otherwise specified above in HPI.   Assessment & Plan: Visit Diagnoses:  1. Chronic bilateral low back pain without sciatica   2. Lumbarization, vertebra   3. Fibromyalgia   4. Bilateral hand pain    Plan: Impression is chronic low back pain with evidence of right greater than left SI joint dysfunction in the setting of transitional lumbosacral anatomy.  Pain had been doing okay until here recently she has been experiencing a flare of both of the low back and her bilateral hands as she does have polyarthralgia in the setting of RA and fibromyalgia.  Given this flare, she will hold Celebrex  today and we did perform an IM Toradol  injection into the glutes, tolerated well.  Starting tomorrow she may  restart her Celebrex  100 mg once to twice daily as needed.  For her chronic tingling/neuropathy, she will continue her Lyrica  100 mg twice daily.  I did commend her on being active and doing home rehab exercises on her own with videos, excetra but I would like her to find some time over the next 2 months to get started in a few sessions of formalized physical therapy with transition to HEP. She is agreeable and understanding of this. Once she is able to do this, we will follow-up in office then to reevaluate.   Follow-up: Return for will f/u after able to get started in a few visits of PT.   Meds & Orders: No orders of the defined types were placed in this encounter.  No orders of the defined types were placed in this encounter.    Procedures: - IM Toradol  (30mg /mL) was administered into the left buttock today      Clinical History: No specialty comments available.  She reports that she quit smoking about 5 years ago. Her smoking use included cigarettes. She started smoking about 8 years ago. She has a 0.8 pack-year smoking history. She has never used smokeless tobacco.  Recent Labs    10/09/23 0959  HGBA1C 6.7    Objective:    Physical Exam  Gen: Well-appearing, in no acute distress; non-toxic CV: Well-perfused. Warm.  Resp: Breathing unlabored on room air; no wheezing. Psych: Fluid speech in  conversation; appropriate affect; normal thought process  Ortho Exam - Low back/sacrum: Generalized tenderness on the right and left paraspinal musculature of the low back.  There is also right greater than left positive Fortin's point test over the SI joint region. Negative slump's test.  - Hands: No significant osteophytosis, no swelling over the PIP or DIP joints.  Imaging: No results found.  Past Medical/Family/Surgical/Social History: Medications & Allergies reviewed per EMR, new medications updated. Patient Active Problem List   Diagnosis Date Noted   Schizoaffective disorder  (HCC) 05/08/2022   Controlled diabetes mellitus type 2 with complications (HCC) 03/17/2021   Rheumatoid arthritis (HCC)    Raynauds disease    Precordial pain 05/18/2020   Essential hypertension 04/13/2020   Palpitations 04/13/2020   Carpopedal spasm 02/01/2015   Epilepsy (HCC) 11/24/2014   Drug allergy , multiple 07/17/2014   Past Medical History:  Diagnosis Date   Anemia    no current med.   Anxiety    Chronic tonsillitis 03/2012   Depression    Diabetes mellitus    diet-controlled   Fibrocystic breast disease    Headache(784.0)    migraines   History of endometriosis    Hyperlipidemia    Hypertension    under control, has been on med. x 1 yr.   Lupus    Lupus    Neuritis    Panic attacks    Raynauds disease    Rheumatoid arthritis(714.0)    Schizoaffective disorder (HCC)    Seizures (HCC)    last seizure > 1 yr. ago   Sinusitis    Sleep disturbance    Thyroid  nodule    Family History  Problem Relation Age of Onset   Colon cancer Father    Heart attack Maternal Grandmother        late 40's   Arthritis Maternal Grandfather    Cancer Maternal Grandfather        lung   Diabetes Paternal Grandmother    Hypertension Paternal Grandmother    Kidney disease Paternal Grandmother    Lupus Other    Pancreatic cancer Neg Hx    Stomach cancer Neg Hx    Liver cancer Neg Hx    Esophageal cancer Neg Hx    Breast cancer Neg Hx    Past Surgical History:  Procedure Laterality Date   ABDOMINAL HYSTERECTOMY  6 yrs. ago   complete   CHOLECYSTECTOMY  6-8 yrs. ago   FOOT SURGERY  05/2011   right great toe   TONSILLECTOMY  04/22/2012   Procedure: TONSILLECTOMY;  Surgeon: Lonni FORBES Angle, MD;  Location: [facility];  Service: ENT;  Laterality: N/A;   Social History   Occupational History   Occupation: [employer]  Tobacco Use   Smoking status: Former    Current packs/day: 0.00    Average packs/day: 0.3 packs/day for 3.0 years (0.8 ttl pk-yrs)    Types:  Cigarettes    Start date: 01/2016    Quit date: 01/2019    Years since quitting: 5.2   Smokeless tobacco: Never   Tobacco comments:    3 cig./day  Vaping Use   Vaping status: Never Used  Substance and Sexual Activity   Alcohol  use: Yes    Alcohol /week: 0.0 standard drinks of alcohol     Comment: occasionally   Drug use: Yes    Types: Marijuana   Sexual activity: Yes    Birth control/protection: None

## 2024-05-05 ENCOUNTER — Ambulatory Visit: Payer: Medicare HMO

## 2024-05-08 ENCOUNTER — Other Ambulatory Visit (HOSPITAL_COMMUNITY): Payer: Self-pay | Admitting: Psychiatry

## 2024-05-08 ENCOUNTER — Telehealth (HOSPITAL_COMMUNITY): Admitting: Psychiatry

## 2024-05-08 ENCOUNTER — Encounter (HOSPITAL_COMMUNITY): Payer: Self-pay | Admitting: Psychiatry

## 2024-05-08 VITALS — Wt 184.0 lb

## 2024-05-08 DIAGNOSIS — F1210 Cannabis abuse, uncomplicated: Secondary | ICD-10-CM

## 2024-05-08 DIAGNOSIS — F250 Schizoaffective disorder, bipolar type: Secondary | ICD-10-CM

## 2024-05-08 DIAGNOSIS — Z638 Other specified problems related to primary support group: Secondary | ICD-10-CM

## 2024-05-08 DIAGNOSIS — F4312 Post-traumatic stress disorder, chronic: Secondary | ICD-10-CM

## 2024-05-08 MED ORDER — LAMOTRIGINE 25 MG PO TABS: ORAL_TABLET | ORAL | 1 refills | Status: DC

## 2024-05-08 MED ORDER — QUETIAPINE FUMARATE 150 MG PO TABS: 150.0000 mg | ORAL_TABLET | Freq: Every day | ORAL | 0 refills | Status: DC

## 2024-05-08 NOTE — Progress Notes (Signed)
 [facility] MD Virtual Progress Note   Patient Location: Home Provider Location: Home office  I connect with patient by video and verified that I am speaking with correct person by using two identifiers. I discussed the limitations of evaluation and management by telemedicine and the availability of in person appointments. I also discussed with the patient that there may be a patient responsible charge related to this service. The patient expressed understanding and agreed to proceed.  Victoria Holmes [id number] 43 y.o.  05/08/2024 10:32 AM  History of Present Illness:  Patient is evaluated by video session.  Today she is very upset on the family members because she reported they are not giving her good care to her father.  She is frustrated because her father made a wish that his cousin from [place]  comes and help him and patient is very disappointed.  Patient told dad does not want her to stay and take care and that is very upsetting.  She is trying to get update about her father and realized that he is not doing very well.  He has Parkinson and declining in health.  She saw the picture that father has blister on her legs.  Patient told he has tremors and cannot hold the coffee and he probably have dropped the coffee on his leg rather than someone hold the cup and give him a coffee.  Patient told father's niece and nephew are busy in their life and they watch television with close door and she is upset because father not getting help.  Today she has a plan to go to visit her father who lives 3 hours away.  She is trying to spend time at least once or twice weekend with the father.  Patient appears very labile, angry, irritable.  She admitted continue to smoke marijuana that helps somewhat calm down.  Her speech is pressured with increased tone.  She denies any suicidal thoughts or homicidal thoughts.  When talk about the medication she reply that she only takes the medicine when she is  by herself because when she is taking care of the father, she wants to be up rather than sleepy.  She continued to endorse flashbacks, nightmares, paranoia.  She is taking Cymbalta  prescribed by PCP.  We have provided BuSpar  but not consistent and same with the Seroquel  150 mg.  Past Psychiatric History: H/O PTSD, schizoaffective disorder, and bipolar disorder.  H/O cutting wrist and neck in 2007 and multiple inpatient.  Seroquel  helped but higher doses made groggy. Tried Depakote but no details. Tried Ativan  and Xanax.  Zoloft, Klonopin had side effects.  As per chart she also had tried Prozac, Concerta, Lexapro.  Has seen family services of [place] and [facility].   Past Medical History:  Diagnosis Date   Anemia    no current med.   Anxiety    Chronic tonsillitis 03/2012   Depression    Diabetes mellitus    diet-controlled   Fibrocystic breast disease    Headache(784.0)    migraines   History of endometriosis    Hyperlipidemia    Hypertension    under control, has been on med. x 1 yr.   Lupus    Lupus    Neuritis    Panic attacks    Raynauds disease    Rheumatoid arthritis(714.0)    Schizoaffective disorder (HCC)    Seizures (HCC)    last seizure > 1 yr. ago   Sinusitis    Sleep disturbance  Thyroid  nodule     Outpatient Encounter Medications as of 05/08/2024  Medication Sig   Accu-Chek Softclix Lancets lancets Use to check blood sugar three times daily. E11.65   amLODipine  (NORVASC ) 5 MG tablet Take 1 tablet (5 mg total) by mouth daily.   atorvastatin  (LIPITOR) 20 MG tablet Take 1 tablet (20 mg total) by mouth daily.   busPIRone  (BUSPAR ) 5 MG tablet Take 1 tablet (5 mg total) by mouth 3 (three) times daily.   celecoxib  (CELEBREX ) 100 MG capsule Take 1 capsule (100 mg total) by mouth 2 (two) times daily.   cephALEXin  (KEFLEX ) 500 MG capsule Take 1 capsule (500 mg total) by mouth 4 (four) times daily.   doxycycline (VIBRAMYCIN) 100 MG capsule Take 100 mg by mouth 2  (two) times daily. (Patient not taking: Reported on 02/10/2024)   DULoxetine  (CYMBALTA ) 60 MG capsule Take 1 capsule (60 mg total) by mouth daily.   EPINEPHRINE  0.3 mg/0.3 mL IJ SOAJ injection Inject 0.3 mg into the muscle as needed for anaphylaxis.   estradiol (ESTRACE) 0.1 MG/GM vaginal cream 1 gram Vaginal Once a day for 2 weeks then 2 times a week 90 days for 90   fluconazole  (DIFLUCAN ) 150 MG tablet Take 1 tablet (150 mg total) by mouth daily. (Patient not taking: Reported on 02/10/2024)   hydroxychloroquine  (PLAQUENIL ) 200 MG tablet Take 200 mg by mouth 2 (two) times daily.   ibuprofen (ADVIL,MOTRIN) 200 MG tablet Take 200 mg by mouth every 6 (six) hours as needed for fever, headache, mild pain, moderate pain or cramping.   metFORMIN  (GLUCOPHAGE ) 500 MG tablet Take 1 tablet (500 mg total) by mouth daily with breakfast.   metroNIDAZOLE  (METROGEL ) 0.75 % vaginal gel Place 1 Applicatorful vaginally 2 (two) times daily. (Patient not taking: Reported on 10/09/2023)   ondansetron  (ZOFRAN ) 4 MG tablet Take 1 tablet (4 mg total) by mouth every 6 (six) hours as needed for nausea or vomiting.   oxybutynin  (DITROPAN -XL) 5 MG 24 hr tablet Take 1 tablet (5 mg total) by mouth at bedtime.   OZEMPIC , 0.25 OR 0.5 MG/DOSE, 2 MG/3ML SOPN Inject 0.5 mg into the skin once a week.   pantoprazole  (PROTONIX ) 40 MG tablet Take 1 tablet (40 mg total) by mouth daily.   pregabalin  (LYRICA ) 100 MG capsule Take 1 capsule (100 mg total) by mouth 2 (two) times daily.   propranolol  (INDERAL ) 20 MG tablet Take 2 tablets (40 mg total) by mouth 2 (two) times daily. (Patient not taking: Reported on 10/09/2023)   QUEtiapine  Fumarate 150 MG TABS Take 150 mg by mouth at bedtime.   Semaglutide ,0.25 or 0.5MG /DOS, (OZEMPIC , 0.25 OR 0.5 MG/DOSE,) 2 MG/1.5ML SOPN Inject 0.25 mg into the skin once a week. For 4 weeks then increase to 0.5mg    Semaglutide ,0.25 or 0.5MG /DOS, (OZEMPIC , 0.25 OR 0.5 MG/DOSE,) 2 MG/3ML SOPN Inject 0.5 mg into the  skin once a week.   spironolactone (ALDACTONE) 50 MG tablet Take 50 mg by mouth daily.   tiZANidine  (ZANAFLEX ) 4 MG tablet TAKE ONE TABLET BY MOUTH EVERY EIGHT HOURS AS NEEDED muscle SPASMS   valACYclovir (VALTREX) 1000 MG tablet Take 1,000 mg by mouth daily.   Vitamin D , Ergocalciferol , (DRISDOL ) 1.25 MG (50000 UNIT) CAPS capsule TAKE ONE CAPSULE BY MOUTH Once weekly   No facility-administered encounter medications on file as of 05/08/2024.    Recent Results (from the past 2160 hours)  Urine Culture     Status: None   Collection Time: 02/10/24  3:42 PM  Specimen: Urine   Urine  Result Value Ref Range   Urine Culture, Routine Final report    Organism ID, Bacteria Comment     Comment: Mixed urogenital flora 25,000-50,000 colony forming units per mL   POCT URINALYSIS DIP (CLINITEK)     Status: Abnormal   Collection Time: 02/10/24  4:54 PM  Result Value Ref Range   Color, UA yellow yellow   Clarity, UA clear clear   Glucose, UA negative negative mg/dL   Bilirubin, UA negative negative   Ketones, POC UA trace (5) (A) negative mg/dL   Spec Grav, UA 8.974 8.989 - 1.025   Blood, UA trace-intact (A) negative   pH, UA 6.0 5.0 - 8.0   POC PROTEIN,UA negative negative, trace   Urobilinogen, UA 0.2 0.2 or 1.0 E.U./dL   Nitrite, UA Negative Negative   Leukocytes, UA Negative Negative  HM DIABETES EYE EXAM     Status: None   Collection Time: 02/12/24 10:36 AM  Result Value Ref Range   HM Diabetic Eye Exam No Retinopathy No Retinopathy    Comment: ABS BY HIM  GeneConnect Molecular Screen-Saliva     Status: None   Collection Time: 03/04/24  3:21 PM  Result Value Ref Range   Genetic Analysis Overall Interpretation Negative    Genetic Disease Assessed      This is a screening test and does not detect all pathogenic or likely pathogenic variant(s) in the tested genes; diagnostic testing is recommended for individuals with a personal or family history of heart disease or hereditary cancer.  [facility] Tier One  Population Screen is a screening test that analyzes 11 genes related to hereditary breast and ovarian cancer (HBOC) syndrome, Lynch syndrome, and familial hypercholesterolemia. This test only reports clinically significant pathogenic and likely  pathogenic variants but does not report variants of uncertain significance (VUS). In addition, analysis of the PMS2 gene excludes exons 11-15, which overlap with a known pseudogene (PMS2CL).    Genetic Analysis Report      No pathogenic or likely pathogenic variants were detected in the genes analyzed by this test.Genetic test results should be interpreted in the context of an individual's personal medical and family history. Alteration to medical management is NOT  recommended based solely on this result. Clinical correlation is advised.Additional Considerations- This is a screening test; individuals may still carry pathogenic or likely pathogenic variant(s) in the tested genes that are not detected by this test.-  For individuals at risk for these or other related conditions based on factors including personal or family history, diagnostic testing is recommended.- The absence of pathogenic or likely pathogenic variant(s) in the analyzed genes, while reassuring,  does not eliminate the possibility of a hereditary condition; there are other variants and genes associated with heart disease and hereditary cancer that are not included in this test.    Genes Tested See Notes     Comment: APOB, BRCA1, BRCA2, EPCAM, LDLR, LDLRAP1, PCSK9, PMS2, MLH1, MSH2, MSH6   Disclaimer See Notes     Comment: This test was developed and validated by [facility] This test has not been cleared or approved by the [place]  Food and Drug Administration (FDA). The [facility] laboratory is accredited by the College of American Pathologists (CAP) and certified under  the Clinical Laboratory Improvement Amendments (CLIA #: [id number]) to perform high-complexity clinical  tests. This test is used for clinical purposes. It should not be regarded as investigational or for research.    Sequencing Location See Notes  Comment: Sequencing done at [facility] [address], [address], [place] [address] (CLIA# [id number])   Interpretation Methods and Limitations See Notes     Comment: Extracted DNA is enriched for targeted regions and then sequenced using the [facility] Exome+ (R) assay on an Illumina DNA sequencing system. Data is then aligned to a modified version of GRCh38 and all genes are analyzed using the MANE transcript and MANE  Plus Clinical transcript, when available. Small variant calling is completed using a customized version of [employer]'s DNAseq software, augmented by a proprietary small variant caller for difficult variants. Copy number variants (CNVs) are then called  using a proprietary bioinformatics pipeline based on depth analysis with a comparison to similarly sequenced samples. Analysis of the PMS2 gene is limited to exons 1-10. The interpretation and reporting of variants in APOB, PCSK9, and LDLR is specific to  familial hypercholesterolemia; variants associated with hypobetalipoproteinemia are not included. Interpretation is based upon guidelines published by the [employer] and [employer]), the Association for Mol ecular Pathology  (AMP) or their modification by [employer] when available and/or review of previous clinical assertions available in the [employer]. Interpretation is limited to the transcripts indicated on the report and +/- 10 bp into  intronic regions, except as noted below. [facility] variant classifications include pathogenic, likely pathogenic, variant of uncertain significance (VUS), likely benign, and benign. Only variants classified as pathogenic and likely pathogenic are included in  the report. All reported variants are confirmed through secondary manual  inspection of DNA sequence data or orthogonal testing. Risk estimations and management guidelines included in this report are based on analysis of primary literature and  recommendations of applicable professional societies, and should be regarded as approximations.Based on validation studies, this assay delivers > 99% sensitivity and specificity for single nucleotide variants and insertions  and deletions (indels) up to  20 bp. Larger indels and complex variants are also reported but sensitivity may be reduced. Based on validation studies, this assay delivers > 99% sensitivity to multi-exon CNVs and > 90% sensitivity to single-exon CNVs. This test may not detect variants  in challenging regions (such as short tandem repeats, homopolymer runs, and segment duplications), sub-exonic CNVs, chromosomal aneuploidy, or variants in the presence of mosaicism. Phasing will be attempted and reported, when possible. Structural  rearrangements such as inversions, translocations, and gene conversions are not tested in this assay unless explicitly indicated. Additionally, deep intronic, promoter, and enhancer regions may not be covered. It is important to note that this is a  screening test and cannot detect all disease-causing variants. A negative result does not guarantee the absence of a rare, undetectable variant in the genes analyzed; consider using a diagnostic test if there i s significant personal and/or family history  of one of the conditions analyzed by this test. Any potential incidental findings outside of these genes and conditions will not be identified, nor reported. The results of a genetic test may be influenced by various factors, including bone marrow  transplantation, blood transfusions, or in rare cases, hematolymphoid neoplasms.Gene Specific Notes:APOB: analysis is limited to c.10580G>A and c.10579C>T; BRCA1: sequencing analysis extends to CDS +/-20 bp; BRCA2: sequencing analysis extends to CDS   +/-20 bp. EPCAM: analysis is limited to CNVof exons 8-9; LDLR: analysis includes CNV ofthe promoter; MLH1: analysis includes CNV of the promoter; PMS2: analysis is limited to exons 1-10.Donnice JINNY Kemp, PhD, [email or web address]      Psychiatric Specialty Exam:  Physical Exam  Review of Systems  Weight 184 lb (83.5 kg).There is no height or weight on file to calculate BMI.  General Appearance: Casual  Eye Contact:  Fair  Speech:  Pressured  Volume:  Increased  Mood:  Irritable  Affect:  Depressed and Labile  Thought Process:  Descriptions of Associations: Intact  Orientation:  Full (Time, Place, and Person)  Thought Content:  Rumination  Suicidal Thoughts:  No  Homicidal Thoughts:  No  Memory:  Immediate;   Good Recent;   Good Remote;   Good  Judgement:  Fair  Insight:  Shallow  Psychomotor Activity:  Increased  Concentration:  Concentration: Fair and Attention Span: Fair  Recall:  Fair  Fund of Knowledge:  Good  Language:  Good  Akathisia:  No  Handed:  Right  AIMS (if indicated):     Assets:  Communication Skills Desire for Improvement Housing Transportation  ADL's:  Intact  Cognition:  WNL  Sleep:  still dreams       03/04/2024    1:39 PM 02/10/2024    2:45 PM 11/07/2023   10:17 AM 10/09/2023    9:53 AM 08/12/2023    3:27 PM  Depression screen PHQ 2/9  Decreased Interest 0 1 1 3 3   Down, Depressed, Hopeless 1 1 1 2 2   PHQ - 2 Score 1 2 2 5 5   Altered sleeping  1 1 3 1   Tired, decreased energy  1 1 3 3   Change in appetite  1 0 3 3  Feeling bad or failure about yourself   0 0 2 2  Trouble concentrating  1 1 3 3   Moving slowly or fidgety/restless  1 0 1 1  Suicidal thoughts  0 0 0 0  PHQ-9 Score  7 5 20 18   Difficult doing work/chores  Somewhat difficult Somewhat difficult  Very difficult    Assessment/Plan: Schizoaffective disorder, bipolar type (HCC) - Plan: QUEtiapine  Fumarate 150 MG TABS, lamoTRIgine (LAMICTAL) 25 MG tablet  Chronic  post-traumatic stress disorder (PTSD) - Plan: QUEtiapine  Fumarate 150 MG TABS, lamoTRIgine (LAMICTAL) 25 MG tablet  Mild tetrahydrocannabinol (THC) abuse - Plan: lamoTRIgine (LAMICTAL) 25 MG tablet  Stress due to family tension - Plan: lamoTRIgine (LAMICTAL) 25 MG tablet  Patient is 43 year old female with history of hypertension, Raynaud's disease, diabetes, history of epilepsy, PTSD, schizophrenia, chronic cannabis use.  Review psychosocial stressors specially family tension as not happy father ask nephew and niece from [place]  to take care of him rather than patient.  She is also not happy that father is not getting good help and she see the picture of the father having blisters on the legs.  Today she is going to visit her.  She admitted not consistent with Seroquel  and BuSpar  because she does not want to take the medication while taking care of her father.  Sometimes medicine makes her very sleepy and not able to take care of the father.  I reviewed her medication and recommend to try Lamictal which is usually not as sedating.  She agreed to give a try.  Recommend not to take BuSpar  as not consistent but she like to take the quetiapine  because it helps sleep when she takes it.  Will try Lamictal 25 mg daily for 1 week and then 50 mg daily.  Discussed possible rash in that case she need to stop the medication immediately.  Talk about cannabis use but patient at this time feel that she needs something to calm her  down.  We have talked about therapy but currently patient is back and forth to visit her father and cannot make commitment for therapy.  Will follow-up in 4 weeks.  Discussed safety concerns at any time having active suicidal thoughts or homicidal thoughts and she need to call 911 or go to local emergency room.   Follow Up Instructions:     I discussed the assessment and treatment plan with the patient. The patient was provided an opportunity to ask questions and all were answered. The  patient agreed with the plan and demonstrated an understanding of the instructions.   The patient was advised to call back or seek an in-person evaluation if the symptoms worsen or if the condition fails to improve as anticipated.    Collaboration of Care: Other provider involved in patient's care AEB notes are available in epic to review  Patient/Guardian was advised Release of Information must be obtained prior to any record release in order to collaborate their care with an outside provider. Patient/Guardian was advised if they have not already done so to contact the registration department to sign all necessary forms in order for us  to release information regarding their care.   Consent: Patient/Guardian gives verbal consent for treatment and assignment of benefits for services provided during this visit. Patient/Guardian expressed understanding and agreed to proceed.     Total encounter time 30 minutes which includes face-to-face time, chart reviewed, care coordination, order entry and documentation during this encounter.   Note: This document was prepared by [employer] voice dictation technology and any errors that results from this process are unintentional.    Leni ONEIDA Client, MD 05/08/2024

## 2024-05-12 ENCOUNTER — Ambulatory Visit: Payer: Self-pay

## 2024-05-12 ENCOUNTER — Ambulatory Visit: Attending: Family Medicine

## 2024-05-12 ENCOUNTER — Other Ambulatory Visit: Payer: Self-pay | Admitting: Family Medicine

## 2024-05-12 DIAGNOSIS — E1169 Type 2 diabetes mellitus with other specified complication: Secondary | ICD-10-CM

## 2024-05-12 NOTE — Telephone Encounter (Signed)
 Attempted to contact patient and LVM inquiring if she would like an earlier appointment in office to discuss concerns.

## 2024-05-12 NOTE — Telephone Encounter (Signed)
 FYI Only or Action Required?: Action required by provider: request for appointment.  Patient was last seen in primary care on 03/04/2024 by Lorren Greig PARAS, NP.  Called Nurse Triage reporting No chief complaint on file..  Symptoms began a week ago.  Interventions attempted: Nothing.  Symptoms are: gradually worsening.  Triage Disposition: No disposition on file.  Patient/caregiver understands and will follow disposition?:   Copied from CRM #[id number]. Topic: Clinical - Red Word Triage >> May 12, 2024 10:51 AM [name] wrote: Red Word that prompted transfer to Nurse Triage: Migraines, Pain, Swelling in her legs and hands. Reason for Disposition  Requesting regular office appointment  Answer Assessment - Initial Assessment Questions 1. LOCATION: Where does it hurt?      *No Answer* 2. ONSET: When did the headache start? (e.g., minutes, hours, days)       3. PATTERN: Does the pain come and go, or has it been constant since it started?     More Frequent  4. SEVERITY: How bad is the pain? and What does it keep you from doing?  (e.g., Scale 1-10; mild, moderate, or severe)     Moderate  5. RECURRENT SYMPTOM: Have you ever had headaches before? If Yes, ask: When was the last time? and What happened that time?      No  6. CAUSE: What do you think is causing the headache?     Stress, Dad is very sick  7. MIGRAINE: Have you been diagnosed with migraine headaches? If Yes, ask: Is this headache similar?      Yes, diagnosed previously  8. HEAD INJURY: Has there been any recent injury to your head?      No  9. OTHER SYMPTOMS: Do you have any other symptoms? (e.g., fever, stiff neck, eye pain, sore throat, cold symptoms)     Loss of Appetite  10. PREGNANCY: Is there any chance you are pregnant? When was your last menstrual period?       No and No  Answer Assessment - Initial Assessment Questions 1. REASON FOR CALL: What is the main reason for your  call? or How can I best help you?     I need to reschedule my Annual or Physical, as I missed my previous one.   2. SYMPTOMS : Do you have any symptoms?      More frequent headaches, migraines, but I know what it is and I don't have time to come in for that right now. I just need to reschedule my appointment.  3. OTHER QUESTIONS: Do you have any other questions?     No  Protocols used: Headache-A-AH, Information Only Call - No Triage-A-AH

## 2024-05-20 ENCOUNTER — Ambulatory Visit: Attending: Sports Medicine

## 2024-05-20 NOTE — Therapy (Incomplete)
 OUTPATIENT PHYSICAL THERAPY THORACOLUMBAR EVALUATION   Patient Name: Victoria Holmes MRN: [id number] DOB:1980/11/19, 43 y.o., female Today's Date: 05/20/2024  END OF SESSION:   Past Medical History:  Diagnosis Date   Anemia    no current med.   Anxiety    Chronic tonsillitis 03/2012   Depression    Diabetes mellitus    diet-controlled   Fibrocystic breast disease    Headache(784.0)    migraines   History of endometriosis    Hyperlipidemia    Hypertension    under control, has been on med. x 1 yr.   Lupus    Lupus    Neuritis    Panic attacks    Raynauds disease    Rheumatoid arthritis(714.0)    Schizoaffective disorder (HCC)    Seizures (HCC)    last seizure > 1 yr. ago   Sinusitis    Sleep disturbance    Thyroid  nodule    Past Surgical History:  Procedure Laterality Date   ABDOMINAL HYSTERECTOMY  6 yrs. ago   complete   CHOLECYSTECTOMY  6-8 yrs. ago   FOOT SURGERY  05/2011   right great toe   TONSILLECTOMY  04/22/2012   Procedure: TONSILLECTOMY;  Surgeon: Lonni FORBES Angle, MD;  Location: [facility];  Service: ENT;  Laterality: N/A;   Patient Active Problem List   Diagnosis Date Noted   Schizoaffective disorder (HCC) 05/08/2022   Controlled diabetes mellitus type 2 with complications (HCC) 03/17/2021   Rheumatoid arthritis (HCC)    Raynauds disease    Precordial pain 05/18/2020   Essential hypertension 04/13/2020   Palpitations 04/13/2020   Carpopedal spasm 02/01/2015   Epilepsy (HCC) 11/24/2014   Drug allergy , multiple 07/17/2014    PCP: Delbert Clam, MD   REFERRING PROVIDER: Burnetta Brunet, DO   REFERRING DIAG:  [phone] (ICD-10-CM) - Lumbarization, vertebra  M54.50,G89.29 (ICD-10-CM) - Chronic bilateral low back pain without sciatica    Rationale for Evaluation and Treatment: Rehabilitation  THERAPY DIAG:  No diagnosis found.  ONSET DATE: ***  SUBJECTIVE:                                                                                                                                                                                            SUBJECTIVE STATEMENT: ***  PERTINENT HISTORY:  ***  PAIN:  Are you having pain? Yes: NPRS scale: *** Pain location: *** Pain description: *** Aggravating factors: *** Relieving factors: ***  PRECAUTIONS: {Therapy precautions:24002}  RED FLAGS: {PT Red Flags:29287}   WEIGHT BEARING RESTRICTIONS: {Yes ***/No:24003}  FALLS:  Has patient fallen in last 6 months? {fallsyesno:27318}  LIVING ENVIRONMENT:  Lives with: {[facility] lives with:25569::lives with their family} Lives in: {Lives in:25570} Stairs: {opstairs:27293} Has following equipment at home: {Assistive devices:23999}  OCCUPATION: ***  PLOF: {PLOF:24004}  PATIENT GOALS: ***  NEXT MD VISIT: ***  OBJECTIVE:  Note: Objective measures were completed at Evaluation unless otherwise noted.  DIAGNOSTIC FINDINGS:  XR Lumbar spine 02/28/24: Complete view of the lumbar spine including AP, lateral, flexion/extension  views were ordered and reviewed by myself today.  X-rays demonstrate  preserved intervertebral disc spaces.  There is no instability with  flexion/extension views.  There is lumbarization of the spine with an  extra L6 vertebrae.   PATIENT SURVEYS:  {rehab surveys:24030}  COGNITION: Overall cognitive status: {cognition:24006}     SENSATION: {sensation:27233}  MUSCLE LENGTH: Hamstrings: Right *** deg; Left *** deg Debby test: Right *** deg; Left *** deg  POSTURE: {posture:25561}  PALPATION: ***  LUMBAR ROM:   AROM eval  Flexion   Extension   Right lateral flexion   Left lateral flexion   Right rotation   Left rotation    (Blank rows = not tested)  LOWER EXTREMITY ROM:     {AROM/PROM:27142}  Right eval Left eval  Hip flexion    Hip extension    Hip abduction    Hip adduction    Hip internal rotation    Hip external rotation    Knee flexion    Knee extension     Ankle dorsiflexion    Ankle plantarflexion    Ankle inversion    Ankle eversion     (Blank rows = not tested)  LOWER EXTREMITY MMT:    MMT Right eval Left eval  Hip flexion    Hip extension    Hip abduction    Hip adduction    Hip internal rotation    Hip external rotation    Knee flexion    Knee extension    Ankle dorsiflexion    Ankle plantarflexion    Ankle inversion    Ankle eversion     (Blank rows = not tested)  LUMBAR SPECIAL TESTS:  {lumbar special test:25242}  FUNCTIONAL TESTS:  {Functional tests:24029}  GAIT: Distance walked: *** Assistive device utilized: {Assistive devices:23999} Level of assistance: {Levels of assistance:24026} Comments: ***  TREATMENT DATE:  [facility] Adult PT Treatment:                                                DATE: 05/20/24 Therapeutic Exercise: *** Manual Therapy: *** Neuromuscular re-ed: *** Therapeutic Activity: *** Modalities: *** Self Care: ***                                                                                                                                  PATIENT EDUCATION:  Education details: *** Person educated: {Person educated:25204} Education method: {Education  Method:25205} Education comprehension: {Education Comprehension:25206}  HOME EXERCISE PROGRAM: ***  ASSESSMENT:  CLINICAL IMPRESSION: Patient is a 43 y.o. female who was seen today for physical therapy evaluation and treatment for  Q76.49 (ICD-10-CM) - Lumbarization, vertebra  M54.50,G89.29 (ICD-10-CM) - Chronic bilateral low back pain without sciatica  .   OBJECTIVE IMPAIRMENTS: {opptimpairments:25111}.   ACTIVITY LIMITATIONS: {activitylimitations:27494}  PARTICIPATION LIMITATIONS: {participationrestrictions:25113}  PERSONAL FACTORS: {Personal factors:25162} are also affecting patient's functional outcome.   REHAB POTENTIAL: {rehabpotential:25112}  CLINICAL DECISION MAKING: {clinical decision  making:25114}  EVALUATION COMPLEXITY: {Evaluation complexity:25115}   GOALS:   SHORT TERM GOALS: Target date: ***  *** Baseline: Goal status: INITIAL  2.  *** Baseline:  Goal status: INITIAL  3.  *** Baseline:  Goal status: INITIAL  4.  *** Baseline:  Goal status: INITIAL  5.  *** Baseline:  Goal status: INITIAL  6.  *** Baseline:  Goal status: INITIAL  LONG TERM GOALS: Target date: ***  *** Baseline:  Goal status: INITIAL  2.  *** Baseline:  Goal status: INITIAL  3.  *** Baseline:  Goal status: INITIAL  4.  *** Baseline:  Goal status: INITIAL  5.  *** Baseline:  Goal status: INITIAL  6.  *** Baseline:  Goal status: INITIAL  PLAN:  PT FREQUENCY: {rehab frequency:25116}  PT DURATION: {rehab duration:25117}  PLANNED INTERVENTIONS: {rehab planned interventions:25118::97110-Therapeutic exercises,97530- Therapeutic [phone]- Neuromuscular re-education,97535- Self Rjmz,02859- Manual therapy,Patient/Family education}.  PLAN FOR NEXT SESSION: ***   Dasie Daft, PT 05/20/2024, 5:51 AM

## 2024-05-25 ENCOUNTER — Encounter: Payer: Self-pay | Admitting: Radiology

## 2024-05-28 ENCOUNTER — Other Ambulatory Visit: Payer: Self-pay | Admitting: Family Medicine

## 2024-05-28 DIAGNOSIS — I10 Essential (primary) hypertension: Secondary | ICD-10-CM

## 2024-06-04 ENCOUNTER — Ambulatory Visit: Admitting: Sports Medicine

## 2024-06-12 ENCOUNTER — Ambulatory Visit: Admitting: Sports Medicine

## 2024-06-23 ENCOUNTER — Ambulatory Visit: Admitting: Family Medicine

## 2024-07-03 ENCOUNTER — Telehealth: Payer: Self-pay | Admitting: Sports Medicine

## 2024-07-03 ENCOUNTER — Telehealth (HOSPITAL_COMMUNITY): Admitting: Psychiatry

## 2024-07-03 ENCOUNTER — Encounter: Payer: Self-pay | Admitting: Sports Medicine

## 2024-07-03 ENCOUNTER — Ambulatory Visit: Admitting: Sports Medicine

## 2024-07-03 DIAGNOSIS — M79642 Pain in left hand: Secondary | ICD-10-CM

## 2024-07-03 DIAGNOSIS — G8929 Other chronic pain: Secondary | ICD-10-CM | POA: Diagnosis not present

## 2024-07-03 DIAGNOSIS — M79641 Pain in right hand: Secondary | ICD-10-CM

## 2024-07-03 DIAGNOSIS — M545 Low back pain, unspecified: Secondary | ICD-10-CM | POA: Diagnosis not present

## 2024-07-03 DIAGNOSIS — Q7649 Other congenital malformations of spine, not associated with scoliosis: Secondary | ICD-10-CM | POA: Diagnosis not present

## 2024-07-03 DIAGNOSIS — M797 Fibromyalgia: Secondary | ICD-10-CM | POA: Diagnosis not present

## 2024-07-03 MED ORDER — PREDNISONE 20 MG PO TABS: ORAL_TABLET | ORAL | 0 refills | Status: AC

## 2024-07-03 NOTE — Progress Notes (Addendum)
 Victoria Holmes - 43 y.o. female MRN [id number]  Date of birth: [birth year]  Office Visit Note: Visit Date: 07/03/2024 PCP: Delbert Clam, MD Referred by: Delbert Clam, MD  Subjective: Chief Complaint  Patient presents with   Lower Back - Follow-up   HPI: Victoria Holmes is a pleasant 43 y.o. female who presents today for chronic low back pain, prominent hand pain.  Victoria Holmes has had pain and locking in both hands, as well as spasms in her low back. She was caring for her father who passed last month, so has been unable to go to physical therapy. She takes Tizanidine , as well as Excedrin as her migraines have gotten much worse. She did sleep last night, although has had trouble sleeping for a long time. She believes that she is holding a lot of tension in her body due to stress, and that it is not helping with her pain.  Has also used Celebrex  100 mg once to twice daily as needed previously.  She occasionally gets burning in the hands, no specific numbness or tingling.  She does not believe she has had a EMG/nerve conduction study in the past.  She does ports she has been diagnosed with carpal spasms previous.  Lab Results  Component Value Date   HGBA1C 6.7 10/09/2023   Pertinent ROS were reviewed with the patient and found to be negative unless otherwise specified above in HPI.   Assessment & Plan: Visit Diagnoses:  1. Chronic bilateral low back pain without sciatica   2. Lumbarization, vertebra   3. Fibromyalgia   4. Bilateral hand pain    Plan: Impression is chronic low back pain in the setting of transitional lumbosacral anatomy.  Also has a history of fibromyalgia/CPS, treated with Cymbalta  60 mg daily, Lyrica  100 mg twice daily and BuSpar  5 mg as needed (not taking consistently). She is experiencing bilateral hand pain with previous carpal spasming as well.  She has been wanting to get started in formalized physical therapy specifically for the back, but has had some life  circumstances that have prevented her from doing so.  She is interested in doing this however, we will have her make an appointment, new referral sent today.  Could consider EMG/nerve conduction study to rule out carpal tunnel for the bilateral hands, although less likely given examination and symptomatology.  I would like to see how she responds to a few treatments of formalized physical therapy and she will notify me/update me at that point going forward. We will place her on short course of oral prednisone  x 7 days only, she may use her tizanidine  for spasming as needed.  Follow-up: Return for Will update me after getting started in PT sessions .   Meds & Orders:  Meds ordered this encounter  Medications   predniSONE  (DELTASONE ) 20 MG tablet    Sig: Take 2 tablets (40mg  total) x 3 days, followed by 1 tablet (20mg ) x 4 additional days until completion    Dispense:  10 tablet    Refill:  0    Orders Placed This Encounter  Procedures   Ambulatory referral to Physical Therapy     Procedures: No procedures performed      Clinical History: No specialty comments available.  She reports that she quit smoking about 5 years ago. Her smoking use included cigarettes. She started smoking about 8 years ago. She has a 0.8 pack-year smoking history. She has never used smokeless tobacco.  Recent Labs    10/09/23 [phone]  HGBA1C 6.7    Objective:    Physical Exam  Gen: Well-appearing, in no acute distress; non-toxic CV: Well-perfused. Warm.  Resp: Breathing unlabored on room air; no wheezing. Psych: Fluid speech in conversation; appropriate affect; normal thought process  Ortho Exam  - Hands: No notable swelling, no wrist effusion.  No dactylitis noted.  Negative Tinel sign at the carpal tunnel inlet/outlet.  Imaging:  02/28/24: Complete view of the lumbar spine including AP, lateral, flexion/extension  views were ordered and reviewed by myself today.  X-rays demonstrate  preserved  intervertebral disc spaces.  There is no instability with  flexion/extension views.  There is lumbarization of the spine with an  extra L6 vertebrae.   Past Medical/Family/Surgical/Social History: Medications & Allergies reviewed per EMR, new medications updated. Patient Active Problem List   Diagnosis Date Noted   Schizoaffective disorder (HCC) 05/08/2022   Controlled diabetes mellitus type 2 with complications (HCC) 03/17/2021   Rheumatoid arthritis (HCC)    Raynauds disease    Precordial pain 05/18/2020   Essential hypertension 04/13/2020   Palpitations 04/13/2020   Carpopedal spasm 02/01/2015   Epilepsy (HCC) 11/24/2014   Drug allergy , multiple 07/17/2014   Past Medical History:  Diagnosis Date   Anemia    no current med.   Anxiety    Chronic tonsillitis 03/2012   Depression    Diabetes mellitus    diet-controlled   Fibrocystic breast disease    Headache(784.0)    migraines   History of endometriosis    Hyperlipidemia    Hypertension    under control, has been on med. x 1 yr.   Lupus    Lupus    Neuritis    Panic attacks    Raynauds disease    Rheumatoid arthritis(714.0)    Schizoaffective disorder (HCC)    Seizures (HCC)    last seizure > 1 yr. ago   Sinusitis    Sleep disturbance    Thyroid  nodule    Family History  Problem Relation Age of Onset   Colon cancer Father    Heart attack Maternal Grandmother        late 40's   Arthritis Maternal Grandfather    Cancer Maternal Grandfather        lung   Diabetes Paternal Grandmother    Hypertension Paternal Grandmother    Kidney disease Paternal Grandmother    Lupus Other    Pancreatic cancer Neg Hx    Stomach cancer Neg Hx    Liver cancer Neg Hx    Esophageal cancer Neg Hx    Breast cancer Neg Hx    Past Surgical History:  Procedure Laterality Date   ABDOMINAL HYSTERECTOMY  6 yrs. ago   complete   CHOLECYSTECTOMY  6-8 yrs. 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

## 2024-07-03 NOTE — Telephone Encounter (Signed)
 Patient needs a new referral for PT sent to [facility]

## 2024-07-03 NOTE — Progress Notes (Signed)
 Victoria Holmes                                          MRN: [id number]   07/03/2024   The [facility] Quality Team Specialist reviewed this patient medical record for the purposes of chart review for care gap closure. The following were reviewed: chart review for care gap closure-kidney health evaluation for diabetes:eGFR  and uACR.    [facility] Quality Team

## 2024-07-03 NOTE — Progress Notes (Signed)
 Patient has had pain and locking in both hands, as well as spasms in her low back. She was caring for her father who passed last month, so has been unable to go to physical therapy. She takes Tizanidine , as well as Excedrin as her migraines have gotten much worse. She did sleep last night, although has had trouble sleeping for a long time. She believes that she is holding a lot of tension in her body due to stress, and that it is not helping with her pain.

## 2024-07-17 ENCOUNTER — Ambulatory Visit: Attending: Sports Medicine

## 2024-07-27 ENCOUNTER — Ambulatory Visit: Admitting: Physical Therapy

## 2024-07-28 ENCOUNTER — Encounter: Payer: Self-pay | Admitting: Physical Therapy

## 2024-07-28 ENCOUNTER — Other Ambulatory Visit: Payer: Self-pay

## 2024-07-28 ENCOUNTER — Ambulatory Visit: Attending: Sports Medicine | Admitting: Physical Therapy

## 2024-07-28 DIAGNOSIS — M6281 Muscle weakness (generalized): Secondary | ICD-10-CM | POA: Diagnosis present

## 2024-07-28 DIAGNOSIS — M5459 Other low back pain: Secondary | ICD-10-CM | POA: Insufficient documentation

## 2024-07-28 NOTE — Therapy (Signed)
 " OUTPATIENT PHYSICAL THERAPY THORACOLUMBAR EVALUATION  Patient Name: Victoria Holmes MRN: [id number] DOB:05-24-1981, 44 y.o., female Today's Date: 07/28/2024   PT End of Session - 07/28/24 1408     Visit Number 1    Date for Recertification  09/22/24    Authorization Type [employer] MCR    Progress Note Due on Visit 10    PT Start Time 1345   pt arrived late   PT Stop Time 1415    PT Time Calculation (min) 30 min          Past Medical History:  Diagnosis Date   Anemia    no current med.   Anxiety    Chronic tonsillitis 03/2012   Depression    Diabetes mellitus    diet-controlled   Fibrocystic breast disease    Headache(784.0)    migraines   History of endometriosis    Hyperlipidemia    Hypertension    under control, has been on med. x 1 yr.   Lupus    Lupus    Neuritis    Panic attacks    Raynauds disease    Rheumatoid arthritis(714.0)    Schizoaffective disorder (HCC)    Seizures (HCC)    last seizure > 1 yr. ago   Sinusitis    Sleep disturbance    Thyroid  nodule    Past Surgical History:  Procedure Laterality Date   ABDOMINAL HYSTERECTOMY  6 yrs. ago   complete   CHOLECYSTECTOMY  6-8 yrs. ago   FOOT SURGERY  05/2011   right great toe   TONSILLECTOMY  04/22/2012   Procedure: TONSILLECTOMY;  Surgeon: Lonni FORBES Angle, MD;  Location: [facility];  Service: ENT;  Laterality: N/A;   Patient Active Problem List   Diagnosis Date Noted   Schizoaffective disorder (HCC) 05/08/2022   Controlled diabetes mellitus type 2 with complications (HCC) 03/17/2021   Rheumatoid arthritis (HCC)    Raynauds disease    Precordial pain 05/18/2020   Essential hypertension 04/13/2020   Palpitations 04/13/2020   Carpopedal spasm 02/01/2015   Epilepsy (HCC) 11/24/2014   Drug allergy , multiple 07/17/2014    PCP: Delbert Clam, MD  REFERRING PROVIDER: Delbert Clam, MD  THERAPY DIAG:  Other low back pain  Muscle weakness  REFERRING DIAG: Low back  pain  Rationale for Evaluation and Treatment:  Rehabilitation  SUBJECTIVE:  PERTINENT PAST HISTORY:  Epilepsy, rheumatoid arthritis, DM TII, Schizoaffective disorder, lupus, fibromyalgia       PRECAUTIONS: None  WEIGHT BEARING RESTRICTIONS No  FALLS:  Has patient fallen in last 6 months? No, Number of falls: 1 fall - not sure what happened came to on my balcony  MOI/History of condition:  Onset date: > 5 years  SUBJECTIVE STATEMENT  Pt is a 44 y.o. female who presents to clinic with chief complaint of chronic low back pain.  She attributes this to her lupus or RA.  She has been on a muscle relaxer but continues to have spasms.  She feels like her hip and low back will lock.  She has gone to chiro but this was not too helpful.  She has infrequent n/t in legs but this may happen 3/14 days.  She feels this may be a stress reaction before her body locks up  Pain:  Are you having pain? Yes Pain location: low back NPRS scale:  Best: 0/10, Worst: 10/10 Aggravating factors: bending, lifting, standing, working Relieving factors: rest Pain description: sharp, aching, and tight  Occupation: CNA  Assistive Device: NA  Hand Dominance: NA  Patient Goals/Specific Activities: improve pain   OBJECTIVE:    LUMBAR AROM  AROM AROM  (Eval)  Flexion Fingertips to toes, w/ concordant pain  Extension limited by 50%, w/ concordant pain  Right lateral flexion limited by 25%, w/ concordant pain  Left lateral flexion limited by 25%, w/ concordant pain  Right rotation limited by 25%  Left rotation limited by 25%, w/ concordant pain    (Blank rows = not tested)   LE MMT:  MMT Right (Eval) Left (Eval)  Hip flexion (L2, L3) 5 5  Knee extension (L3) 4 4  Knee flexion 4 4  Hip abduction 3+ 3+  Hip extension    Hip external rotation    Hip internal rotation    Hip adduction    Ankle dorsiflexion (L4)    Ankle plantarflexion (S1)    Ankle inversion    Ankle eversion     Great Toe ext (L5)    Grossly     (Blank rows = not tested, score listed is out of 5 possible points.  N = WNL, D = diminished, C = clear for gross weakness with myotome testing, * = concordant pain with testing)  SPECIAL TESTS:  Straight leg raise: L (-), R (-) Slump: L (-), R (-)  MUSCLE LENGTH: Hamstrings: Right no restriction; Left no restriction Hip flexors: Not tested  LE ROM:  ROM Right (Eval) Left (Eval)  Hip flexion    Hip extension    Hip abduction    Hip adduction    Hip internal rotation    Hip external rotation    Knee flexion    Knee extension    Ankle dorsiflexion    Ankle plantarflexion    Ankle inversion    Ankle eversion      (Blank rows = not tested, N = WNL, * = concordant pain with testing)  Functional Tests  Eval                                                               PATIENT SURVEYS:  ODI: 21/50  HOME EXERCISE PROGRAM: PPT 10x5s 2x/day  Treatment priorities   Eval        Check hip flexor tightness                                         TODAY'S TREATMENT:  Therapeutic Exercise: Creating, reviewing, and completing HEP   PATIENT EDUCATION ([place]/HM):  POC, diagnosis, prognosis, HEP, and outcome measures.  Pt educated via explanation, demonstration, and handout (HEP).  Pt confirms understanding verbally.   ASSESSMENT:  CLINICAL IMPRESSION: Zoi is a 44 y.o. female who presents to clinic with signs and sxs consistent with chronic low back pain and spasms.  Limited depth of of exam d/t pt late arrival.   No clear sign of radiculopathy.   Jaslene will benefit from skilled PT to address relevant deficits and improve comfort with daily tasks.   OBJECTIVE IMPAIRMENTS: Pain, lumbar ROM, LE/hip/core strength, gati  ACTIVITY LIMITATIONS: standing, walking, bending, lifting, recreation  PERSONAL FACTORS: See medical history and pertinent history   REHAB POTENTIAL: Good  CLINICAL DECISION  MAKING: Evolving/moderate  complexity  EVALUATION COMPLEXITY: Moderate   GOALS:   SHORT TERM GOALS: Target date: 08/25/2024   Sameena will be >75% HEP compliant to improve carryover between sessions and facilitate independent management of condition  Evaluation: ongoing Goal status: INITIAL   LONG TERM GOALS: Target date: 09/22/2024   Sareena will self report >/= 50% decrease in pain from evaluation to improve function in daily tasks  Evaluation/Baseline: 10/10 max pain Goal status: INITIAL   2.  Maudean will show a >/= 8 pt improvement in their ODI score (MCID is 12% or 6/50 pts) as a proxy for functional improvement   Evaluation/Baseline: 21 pts Goal status: INITIAL   3.  Charlissa will be able to complete workouts (possibly modified), not limited by pain  Evaluation/Baseline: limited Goal status: INITIAL   4.  Lennix will improve the following MMTs to >/= 4/5 to show improvement in strength:  hip abd and ext   Evaluation/Baseline: see chart in note Goal status: INITIAL    PLAN: PT FREQUENCY: 1-2x/week  PT DURATION: 8 weeks  PLANNED INTERVENTIONS:  [id number]- PT Re-evaluation, 97110-Therapeutic exercises, 97530- Therapeutic activity, [id number]- Neuromuscular re-education, 97535- Self Care, [id number]- Manual therapy, [id number]- Gait training, [id number]- Aquatic Therapy, [phone]- Electrical stimulation (manual), [id number]- Vasopneumatic device, [id number]- Traction (mechanical), [id number]- Ionotophoresis 4mg /ml Dexamethasone , Taping, Dry Needling, Joint manipulation, and Spinal manipulation.   Zaina Jenkin E Josua Ferrebee PT 07/28/2024, 2:12 PM "

## 2024-08-04 ENCOUNTER — Encounter: Payer: Self-pay | Admitting: Physical Therapy

## 2024-08-04 ENCOUNTER — Telehealth (HOSPITAL_COMMUNITY): Admitting: Psychiatry

## 2024-08-04 ENCOUNTER — Other Ambulatory Visit: Payer: Self-pay | Admitting: Family Medicine

## 2024-08-04 ENCOUNTER — Ambulatory Visit: Admitting: Physical Therapy

## 2024-08-04 ENCOUNTER — Encounter (HOSPITAL_COMMUNITY): Payer: Self-pay | Admitting: Psychiatry

## 2024-08-04 VITALS — Wt 187.0 lb

## 2024-08-04 DIAGNOSIS — M5459 Other low back pain: Secondary | ICD-10-CM

## 2024-08-04 DIAGNOSIS — F4321 Adjustment disorder with depressed mood: Secondary | ICD-10-CM | POA: Diagnosis not present

## 2024-08-04 DIAGNOSIS — F250 Schizoaffective disorder, bipolar type: Secondary | ICD-10-CM | POA: Diagnosis not present

## 2024-08-04 DIAGNOSIS — Z638 Other specified problems related to primary support group: Secondary | ICD-10-CM

## 2024-08-04 DIAGNOSIS — F4312 Post-traumatic stress disorder, chronic: Secondary | ICD-10-CM

## 2024-08-04 DIAGNOSIS — Z7985 Long-term (current) use of injectable non-insulin antidiabetic drugs: Secondary | ICD-10-CM

## 2024-08-04 DIAGNOSIS — F1210 Cannabis abuse, uncomplicated: Secondary | ICD-10-CM | POA: Diagnosis not present

## 2024-08-04 DIAGNOSIS — M6281 Muscle weakness (generalized): Secondary | ICD-10-CM

## 2024-08-04 DIAGNOSIS — E1169 Type 2 diabetes mellitus with other specified complication: Secondary | ICD-10-CM

## 2024-08-04 MED ORDER — QUETIAPINE FUMARATE 150 MG PO TABS: 150.0000 mg | ORAL_TABLET | Freq: Every day | ORAL | 0 refills | Status: AC

## 2024-08-04 MED ORDER — DULOXETINE HCL 60 MG PO CPEP: 60.0000 mg | ORAL_CAPSULE | Freq: Every day | ORAL | 0 refills | Status: AC

## 2024-08-04 MED ORDER — LAMOTRIGINE 25 MG PO TABS: ORAL_TABLET | ORAL | 1 refills | Status: AC
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
## 2024-08-04 NOTE — Therapy (Signed)
 " OUTPATIENT PHYSICAL THERAPY THORACOLUMBAR TREATMENT  Patient Name: Victoria Holmes MRN: [id number] DOB:[birth year], 44 y.o., female Today's Date: 08/04/2024   PT End of Session - 08/04/24 1455     Visit Number 2    Date for Recertification  09/22/24    Authorization Type [employer] MCR    Progress Note Due on Visit 10    PT Start Time 0252    PT Stop Time 0330    PT Time Calculation (min) 38 min          Past Medical History:  Diagnosis Date   Anemia    no current med.   Anxiety    Chronic tonsillitis 03/2012   Depression    Diabetes mellitus    diet-controlled   Fibrocystic breast disease    Headache(784.0)    migraines   History of endometriosis    Hyperlipidemia    Hypertension    under control, has been on med. x 1 yr.   Lupus    Lupus    Neuritis    Panic attacks    Raynauds disease    Rheumatoid arthritis(714.0)    Schizoaffective disorder (HCC)    Seizures (HCC)    last seizure > 1 yr. ago   Sinusitis    Sleep disturbance    Thyroid  nodule    Past Surgical History:  Procedure Laterality Date   ABDOMINAL HYSTERECTOMY  6 yrs. ago   complete   CHOLECYSTECTOMY  6-8 yrs. ago   FOOT SURGERY  05/2011   right great toe   TONSILLECTOMY  04/22/2012   Procedure: TONSILLECTOMY;  Surgeon: Lonni FORBES Angle, MD;  Location: [facility];  Service: ENT;  Laterality: N/A;   Patient Active Problem List   Diagnosis Date Noted   Schizoaffective disorder (HCC) 05/08/2022   Controlled diabetes mellitus type 2 with complications (HCC) 03/17/2021   Rheumatoid arthritis (HCC)    Raynauds disease    Precordial pain 05/18/2020   Essential hypertension 04/13/2020   Palpitations 04/13/2020   Carpopedal spasm 02/01/2015   Epilepsy (HCC) 11/24/2014   Drug allergy , multiple 07/17/2014    PCP: Delbert Clam, MD  REFERRING PROVIDER: Delbert Clam, MD  THERAPY DIAG:  Other low back pain  Muscle weakness  REFERRING DIAG: Low back pain  Rationale for  Evaluation and Treatment:  Rehabilitation  SUBJECTIVE:  PERTINENT PAST HISTORY:  Epilepsy, rheumatoid arthritis, DM TII, Schizoaffective disorder, lupus, fibromyalgia       PRECAUTIONS: None  WEIGHT BEARING RESTRICTIONS No  FALLS:  Has patient fallen in last 6 months? No, Number of falls: 1 fall - not sure what happened came to on my balcony  MOI/History of condition:  Onset date: > 5 years  SUBJECTIVE STATEMENT  My back has been spasm ing and locking up more since starting the HEP.   Pt is a 44 y.o. female who presents to clinic with chief complaint of chronic low back pain.  She attributes this to her lupus or RA.  She has been on a muscle relaxer but continues to have spasms.  She feels like her hip and low back will lock.  She has gone to chiro but this was not too helpful.  She has infrequent n/t in legs but this may happen 3/14 days.  She feels this may be a stress reaction before her body locks up  Pain:  Are you having pain? Yes Pain location: low back NPRS scale:  Best: 0/10, Worst: 10/10 Aggravating factors: bending, lifting, standing, working Relieving  factors: rest Pain description: sharp, aching, and tight  Occupation: Materials Engineer: NA  Hand Dominance: NA  Patient Goals/Specific Activities: improve pain   OBJECTIVE:    LUMBAR AROM  AROM AROM  (Eval)  Flexion Fingertips to toes, w/ concordant pain  Extension limited by 50%, w/ concordant pain  Right lateral flexion limited by 25%, w/ concordant pain  Left lateral flexion limited by 25%, w/ concordant pain  Right rotation limited by 25%  Left rotation limited by 25%, w/ concordant pain    (Blank rows = not tested)   LE MMT:  MMT Right (Eval) Left (Eval)  Hip flexion (L2, L3) 5 5  Knee extension (L3) 4 4  Knee flexion 4 4  Hip abduction 3+ 3+  Hip extension    Hip external rotation    Hip internal rotation    Hip adduction    Ankle dorsiflexion (L4)    Ankle plantarflexion  (S1)    Ankle inversion    Ankle eversion    Great Toe ext (L5)    Grossly     (Blank rows = not tested, score listed is out of 5 possible points.  N = WNL, D = diminished, C = clear for gross weakness with myotome testing, * = concordant pain with testing)  SPECIAL TESTS:  Straight leg raise: L (-), R (-) Slump: L (-), R (-)  MUSCLE LENGTH: Hamstrings: Right no restriction; Left no restriction Hip flexors: Not tested  LE ROM:  ROM Right (Eval) Left (Eval)  Hip flexion    Hip extension    Hip abduction    Hip adduction    Hip internal rotation    Hip external rotation    Knee flexion    Knee extension    Ankle dorsiflexion    Ankle plantarflexion    Ankle inversion    Ankle eversion      (Blank rows = not tested, N = WNL, * = concordant pain with testing)  Functional Tests  Eval                                                               PATIENT SURVEYS:  ODI: 21/50  HOME EXERCISE PROGRAM: PPT 10x5s 2x/day  Access Code: [email or web address] URL: [email or web address] Date: 08/04/2024 Prepared by: Harlene Persons  Exercises - Pelvic tilt  - 1 x daily - 7 x weekly - 2 sets - 8 reps - 3 hold - Hooklying Clamshell with Resistance  - 2 x daily - 7 x weekly - 2 sets - 8 reps - 3 hold - Supine Bridge with Resistance Band  - 1 x daily - 7 x weekly - 2 sets - 8 reps  Treatment priorities   Eval        Check hip flexor tightness                                         TODAY'S TREATMENT: PPT -cues for PPT only.  Supine marching with PPT  LTR SKTC  Bridge 5 x 2  Supine clam RTB 2 x 8 Childs pose forward and lateral    EVAL: Therapeutic Exercise: Creating, reviewing, and completing HEP  PATIENT EDUCATION ([facility]):  POC, diagnosis, prognosis, HEP, and outcome measures.  Pt educated via explanation, demonstration, and handout (HEP).  Pt confirms understanding verbally.   ASSESSMENT:  CLINICAL IMPRESSION: Pt reports increased  episodes of back locking since last visit. Reviewed PPT and she did require cues for correct technique. Increased time required for education throughout session. Able to progress ore and hip strengthening with min increased pain mostly left low back. Min relief with stretches. Updated HEP and cautioned to discontinue if increased pain.    EVAL: Victoria Holmes is a 44 y.o. female who presents to clinic with signs and sxs consistent with chronic low back pain and spasms.  Limited depth of of exam d/t pt late arrival.   No clear sign of radiculopathy.   Dawson will benefit from skilled PT to address relevant deficits and improve comfort with daily tasks.   OBJECTIVE IMPAIRMENTS: Pain, lumbar ROM, LE/hip/core strength, gati  ACTIVITY LIMITATIONS: standing, walking, bending, lifting, recreation  PERSONAL FACTORS: See medical history and pertinent history   REHAB POTENTIAL: Good  CLINICAL DECISION MAKING: Evolving/moderate complexity  EVALUATION COMPLEXITY: Moderate   GOALS:   SHORT TERM GOALS: Target date: 08/25/2024   Victoria Holmes will be >75% HEP compliant to improve carryover between sessions and facilitate independent management of condition  Evaluation: ongoing Goal status: INITIAL   LONG TERM GOALS: Target date: 09/22/2024   Victoria Holmes will self report >/= 50% decrease in pain from evaluation to improve function in daily tasks  Evaluation/Baseline: 10/10 max pain Goal status: INITIAL   2.  Victoria Holmes will show a >/= 8 pt improvement in their ODI score (MCID is 12% or 6/50 pts) as a proxy for functional improvement   Evaluation/Baseline: 21 pts Goal status: INITIAL   3.  Victoria Holmes will be able to complete workouts (possibly modified), not limited by pain  Evaluation/Baseline: limited Goal status: INITIAL   4.  Victoria Holmes will improve the following MMTs to >/= 4/5 to show improvement in strength:  hip abd and ext   Evaluation/Baseline: see chart in note Goal status: INITIAL    PLAN: PT FREQUENCY:  1-2x/week  PT DURATION: 8 weeks  PLANNED INTERVENTIONS:  97164- PT Re-evaluation, 97110-Therapeutic exercises, 97530- Therapeutic activity, 97112- Neuromuscular re-education, 97535- Self Care, [id number]- Manual therapy, [id number]- Gait training, [id number]- Aquatic Therapy, [phone]- Electrical stimulation (manual), [id number]- Vasopneumatic device, [id number]- Traction (mechanical), [id number]- Ionotophoresis 4mg /ml Dexamethasone , Taping, Dry Needling, Joint manipulation, and Spinal manipulation.   Harlene Persons, PTA 08/04/2024 3:29 PM Phone: [phone] Fax: [phone]  "

## 2024-08-10 ENCOUNTER — Ambulatory Visit: Admitting: Physical Therapy

## 2024-08-10 ENCOUNTER — Encounter: Payer: Self-pay | Admitting: Physical Therapy

## 2024-08-10 DIAGNOSIS — M6281 Muscle weakness (generalized): Secondary | ICD-10-CM

## 2024-08-10 DIAGNOSIS — M5459 Other low back pain: Secondary | ICD-10-CM | POA: Diagnosis not present

## 2024-08-10 NOTE — Therapy (Signed)
 " OUTPATIENT PHYSICAL THERAPY THORACOLUMBAR TREATMENT  Patient Name: Victoria Holmes MRN: [id number] DOB:Sep 11, 1980, 44 y.o., female Today's Date: 08/10/2024   PT End of Session - 08/10/24 1336     Visit Number 3    Date for Recertification  09/22/24    Authorization Type [employer] MCR    Progress Note Due on Visit 10    PT Start Time 1335    PT Stop Time 1415    PT Time Calculation (min) 40 min          Past Medical History:  Diagnosis Date   Anemia    no current med.   Anxiety    Chronic tonsillitis 03/2012   Depression    Diabetes mellitus    diet-controlled   Fibrocystic breast disease    Headache(784.0)    migraines   History of endometriosis    Hyperlipidemia    Hypertension    under control, has been on med. x 1 yr.   Lupus    Lupus    Neuritis    Panic attacks    Raynauds disease    Rheumatoid arthritis(714.0)    Schizoaffective disorder (HCC)    Seizures (HCC)    last seizure > 1 yr. ago   Sinusitis    Sleep disturbance    Thyroid  nodule    Past Surgical History:  Procedure Laterality Date   ABDOMINAL HYSTERECTOMY  6 yrs. ago   complete   CHOLECYSTECTOMY  6-8 yrs. ago   FOOT SURGERY  05/2011   right great toe   TONSILLECTOMY  04/22/2012   Procedure: TONSILLECTOMY;  Surgeon: Victoria Holmes Angle, MD;  Location: [facility];  Service: ENT;  Laterality: N/A;   Patient Active Problem List   Diagnosis Date Noted   Schizoaffective disorder (HCC) 05/08/2022   Controlled diabetes mellitus type 2 with complications (HCC) 03/17/2021   Rheumatoid arthritis (HCC)    Raynauds disease    Precordial pain 05/18/2020   Essential hypertension 04/13/2020   Palpitations 04/13/2020   Carpopedal spasm 02/01/2015   Epilepsy (HCC) 11/24/2014   Drug allergy , multiple 07/17/2014    PCP: Victoria Clam, MD  REFERRING PROVIDER: Burnetta Brunet, DO  THERAPY DIAG:  Other low back pain  Muscle weakness  REFERRING DIAG: Low back pain  Rationale for  Evaluation and Treatment:  Rehabilitation  SUBJECTIVE:  PERTINENT PAST HISTORY:  Epilepsy, rheumatoid arthritis, DM TII, Schizoaffective disorder, lupus, fibromyalgia       PRECAUTIONS: None  WEIGHT BEARING RESTRICTIONS No  FALLS:  Has patient fallen in last 6 months? No, Number of falls: 1 fall - not sure what happened came to on my balcony  MOI/History of condition:  Onset date: > 5 years  SUBJECTIVE STATEMENT  Pt reports that she is having pain in her back knee's and shoulders.  Pt is a 44 y.o. female who presents to clinic with chief complaint of chronic low back pain.  She attributes this to her lupus or RA.  She has been on a muscle relaxer but continues to have spasms.  She feels like her hip and low back will lock.  She has gone to chiro but this was not too helpful.  She has infrequent n/t in legs but this may happen 3/14 days.  She feels this may be a stress reaction before her body locks up  Pain:  Are you having pain? Yes Pain location: low back NPRS scale:  Best: 0/10, Worst: 10/10 Aggravating factors: bending, lifting, standing, working Relieving factors: rest  Pain description: sharp, aching, and tight  Occupation: Materials Engineer: NA  Hand Dominance: NA  Patient Goals/Specific Activities: improve pain   OBJECTIVE:    LUMBAR AROM  AROM AROM  (Eval)  Flexion Fingertips to toes, w/ concordant pain  Extension limited by 50%, w/ concordant pain  Right lateral flexion limited by 25%, w/ concordant pain  Left lateral flexion limited by 25%, w/ concordant pain  Right rotation limited by 25%  Left rotation limited by 25%, w/ concordant pain    (Blank rows = not tested)   LE MMT:  MMT Right (Eval) Left (Eval)  Hip flexion (L2, L3) 5 5  Knee extension (L3) 4 4  Knee flexion 4 4  Hip abduction 3+ 3+  Hip extension    Hip external rotation    Hip internal rotation    Hip adduction    Ankle dorsiflexion (L4)    Ankle plantarflexion  (S1)    Ankle inversion    Ankle eversion    Great Toe ext (L5)    Grossly     (Blank rows = not tested, score listed is out of 5 possible points.  N = WNL, D = diminished, C = clear for gross weakness with myotome testing, * = concordant pain with testing)  SPECIAL TESTS:  Straight leg raise: L (-), R (-) Slump: L (-), R (-)  MUSCLE LENGTH: Hamstrings: Right no restriction; Left no restriction Hip flexors: Not tested  LE ROM:  ROM Right (Eval) Left (Eval)  Hip flexion    Hip extension    Hip abduction    Hip adduction    Hip internal rotation    Hip external rotation    Knee flexion    Knee extension    Ankle dorsiflexion    Ankle plantarflexion    Ankle inversion    Ankle eversion      (Blank rows = not tested, N = WNL, * = concordant pain with testing)  Functional Tests  Eval                                                               PATIENT SURVEYS:  ODI: 21/50  HOME EXERCISE PROGRAM: PPT 10x5s 2x/day  Access Code: [email or web address] URL: [email or web address] Date: 08/04/2024 Prepared by: Victoria Holmes  Exercises - Pelvic tilt  - 1 x daily - 7 x weekly - 2 sets - 8 reps - 3 hold - Hooklying Clamshell with Resistance  - 2 x daily - 7 x weekly - 2 sets - 8 reps - 3 hold - Supine Bridge with Resistance Band  - 1 x daily - 7 x weekly - 2 sets - 8 reps  Treatment priorities   Eval        Check hip flexor tightness                                         TODAY'S TREATMENT:  Therex:  nu-step L4 33m LE only d/t shoulder pain Seated lumbar roll out with Pball LAQ - 2# - 2x10 Seated HS curl - 2x8 - RTB STS - 5x - raised table Education regarding goals of therapy: function vs pain reduction  and different ways to view progress   ASSESSMENT:  CLINICAL IMPRESSION: Pt continues with fair tolerance d/t pain.  Reports several instance of back feeling like it will lock.  Mild increases in joint pain reported throughout which  are transient with no overall increase in pain following session.  Will gauge response next visit.   EVAL: Victoria Holmes is a 44 y.o. female who presents to clinic with signs and sxs consistent with chronic low back pain and spasms.  Limited depth of of exam d/t pt late arrival.   No clear sign of radiculopathy.   Victoria Holmes will benefit from skilled PT to address relevant deficits and improve comfort with daily tasks.   OBJECTIVE IMPAIRMENTS: Pain, lumbar ROM, LE/hip/core strength, gati  ACTIVITY LIMITATIONS: standing, walking, bending, lifting, recreation  PERSONAL FACTORS: See medical history and pertinent history   REHAB POTENTIAL: Good  CLINICAL DECISION MAKING: Evolving/moderate complexity  EVALUATION COMPLEXITY: Moderate   GOALS:   SHORT TERM GOALS: Target date: 08/25/2024   Breon will be >75% HEP compliant to improve carryover between sessions and facilitate independent management of condition  Evaluation: ongoing Goal status: INITIAL   LONG TERM GOALS: Target date: 09/22/2024   Kristol will self report >/= 50% decrease in pain from evaluation to improve function in daily tasks  Evaluation/Baseline: 10/10 max pain Goal status: INITIAL   2.  Aryiana will show a >/= 8 pt improvement in their ODI score (MCID is 12% or 6/50 pts) as a proxy for functional improvement   Evaluation/Baseline: 21 pts Goal status: INITIAL   3.  Alece will be able to complete workouts (possibly modified), not limited by pain  Evaluation/Baseline: limited Goal status: INITIAL   4.  Donda will improve the following MMTs to >/= 4/5 to show improvement in strength:  hip abd and ext   Evaluation/Baseline: see chart in note Goal status: INITIAL    PLAN: PT FREQUENCY: 1-2x/week  PT DURATION: 8 weeks  PLANNED INTERVENTIONS:  [id number]- PT Re-evaluation, 97110-Therapeutic exercises, 97530- Therapeutic activity, [id number]- Neuromuscular re-education, 97535- Self Care, [id number]- Manual therapy, [id number]- Gait  training, [id number]- Aquatic Therapy, [id number]- Electrical stimulation (manual), [id number]- Vasopneumatic device, [id number]- Traction (mechanical), [id number]- Ionotophoresis 4mg /ml Dexamethasone , Taping, Dry Needling, Joint manipulation, and Spinal manipulation.   Helene BRAVO Shiron Whetsel PT 08/10/24 3:07 PM Phone: [phone] Fax: [phone]  "

## 2024-08-11 ENCOUNTER — Ambulatory Visit

## 2024-08-12 ENCOUNTER — Ambulatory Visit: Attending: Family Medicine | Admitting: Family Medicine

## 2024-08-12 ENCOUNTER — Encounter: Payer: Self-pay | Admitting: Family Medicine

## 2024-08-12 ENCOUNTER — Other Ambulatory Visit (HOSPITAL_COMMUNITY)
Admission: RE | Admit: 2024-08-12 | Discharge: 2024-08-12 | Disposition: A | Discharge status: Home / Self Care | Source: Ambulatory Visit | Attending: Family Medicine | Admitting: Family Medicine

## 2024-08-12 VITALS — BP 105/71 | HR 89 | Temp 98.1°F | Ht 61.5 in | Wt 187.4 lb

## 2024-08-12 DIAGNOSIS — M069 Rheumatoid arthritis, unspecified: Secondary | ICD-10-CM | POA: Diagnosis not present

## 2024-08-12 DIAGNOSIS — Z113 Encounter for screening for infections with a predominantly sexual mode of transmission: Secondary | ICD-10-CM | POA: Diagnosis present

## 2024-08-12 DIAGNOSIS — M797 Fibromyalgia: Secondary | ICD-10-CM

## 2024-08-12 DIAGNOSIS — E1169 Type 2 diabetes mellitus with other specified complication: Secondary | ICD-10-CM

## 2024-08-12 DIAGNOSIS — E1159 Type 2 diabetes mellitus with other circulatory complications: Secondary | ICD-10-CM

## 2024-08-12 DIAGNOSIS — I10 Essential (primary) hypertension: Secondary | ICD-10-CM

## 2024-08-12 DIAGNOSIS — N3001 Acute cystitis with hematuria: Secondary | ICD-10-CM | POA: Diagnosis not present

## 2024-08-12 DIAGNOSIS — Z7985 Long-term (current) use of injectable non-insulin antidiabetic drugs: Secondary | ICD-10-CM

## 2024-08-12 DIAGNOSIS — R399 Unspecified symptoms and signs involving the genitourinary system: Secondary | ICD-10-CM

## 2024-08-12 LAB — POCT URINALYSIS DIP (CLINITEK)
Bilirubin, UA: NEGATIVE
Glucose, UA: NEGATIVE mg/dL
Ketones, POC UA: NEGATIVE mg/dL
Nitrite, UA: POSITIVE — AB
Spec Grav, UA: >=1.03 — AB
Urobilinogen, UA: 1 U/dL
pH, UA: 6

## 2024-08-12 LAB — POCT GLYCOSYLATED HEMOGLOBIN (HGB A1C): HbA1c, POC (controlled diabetic range): 7.1 % — AB (ref 0.0–7.0)

## 2024-08-12 MED ORDER — METHOCARBAMOL 500 MG PO TABS: 500.0000 mg | ORAL_TABLET | Freq: Three times a day (TID) | ORAL | 2 refills | Status: AC | PRN

## 2024-08-12 MED ORDER — NITROFURANTOIN MONOHYD MACRO 100 MG PO CAPS: 100.0000 mg | ORAL_CAPSULE | Freq: Two times a day (BID) | ORAL | 0 refills | Status: AC

## 2024-08-12 MED ORDER — AMLODIPINE BESYLATE 2.5 MG PO TABS: 2.5000 mg | ORAL_TABLET | Freq: Every day | ORAL | 1 refills | Status: AC

## 2024-08-12 NOTE — Progress Notes (Unsigned)
 "  Subjective:  Patient ID: Victoria Holmes, female    DOB: May 23, 1981  Age: 44 y.o. MRN: [id number]  CC: Medical Management of Chronic Issues (Requesting lab work/STD testing/Discuss new muscle relaxer)     Discussed the use of AI scribe software for clinical note transcription with the patient, who gave verbal consent to proceed.  History of Present Illness Victoria Holmes is a 44 year old female who presents for STD testing and medication management.  She requests STD testing with urine and vaginal swabs for chlamydia, gonorrhea, yeast, and bacterial vaginosis, and blood tests for HIV and syphilis. She has urinary burning, frequency, and strong odor that she associates with UTI. Urine and swab samples were obtained today.  She is concerned about diabetes control. Her recent A1c rose from 6.7 to 7.1. She notes frequent low blood sugars and eats very little. She has taken Ozempic  0.25 mg inconsistently since 05-12-25 and does not take metformin  daily because it causes marked glucose drops. Recent glucose readings were 76 fasting and 84 after half a biscuit. Medication adherence has been difficult while caring for her ill father who died in 05/12/2025.  She has hypertension. She recalls a very low blood pressure at a recent home check. She takes amlodipine  5 mg and spironolactone.  She has chronic muscle spasms and pain not relieved by tizanidine . She attends physical therapy two to three times weekly and feels spasms and weakness are worsening. She wonders about fibromyalgia. She takes duloxetine  60 mg and Lyrica  twice daily without meaningful relief.  She is disabled and is the primary caregiver for her mother after recent eye surgery, which further limits her ability to manage her own conditions.    Past Medical History:  Diagnosis Date   Anemia    no current med.   Anxiety    Chronic tonsillitis 03/2012   Depression    Diabetes mellitus    diet-controlled   Fibrocystic breast disease     Headache(784.0)    migraines   History of endometriosis    Hyperlipidemia    Hypertension    under control, has been on med. x 1 yr.   Lupus    Lupus    Neuritis    Panic attacks    Raynauds disease    Rheumatoid arthritis(714.0)    Schizoaffective disorder (HCC)    Seizures (HCC)    last seizure > 1 yr. ago   Sinusitis    Sleep disturbance    Thyroid  nodule     Past Surgical History:  Procedure Laterality Date   ABDOMINAL HYSTERECTOMY  6 yrs. ago   complete   CHOLECYSTECTOMY  6-8 yrs. ago   FOOT SURGERY  05/2011   right great toe   TONSILLECTOMY  05-12-2012   Procedure: TONSILLECTOMY;  Surgeon: Lonni FORBES Angle, MD;  Location: [facility];  Service: ENT;  Laterality: N/A;    Family History  Problem Relation Age of Onset   Colon cancer Father    Heart attack Maternal Grandmother        late 40's   Arthritis Maternal Grandfather    Cancer Maternal Grandfather        lung   Diabetes Paternal Grandmother    Hypertension Paternal Grandmother    Kidney disease Paternal Grandmother    Lupus Other    Pancreatic cancer Neg Hx    Stomach cancer Neg Hx    Liver cancer Neg Hx    Esophageal cancer Neg Hx    Breast cancer  Neg Hx     Social History   Socioeconomic History   Marital status: Single    Spouse name: Not on file   Number of children: 1   Years of education: Not on file   Highest education level: Some college, no degree  Occupational History   Occupation: Diasbled  Tobacco Use   Smoking status: Former    Current packs/day: 0.00    Average packs/day: 0.3 packs/day for 3.0 years (0.8 ttl pk-yrs)    Types: Cigarettes    Start date: 01/2016    Quit date: 01/2019    Years since quitting: 5.5   Smokeless tobacco: Never   Tobacco comments:    3 cig./day  Vaping Use   Vaping status: Never Used  Substance and Sexual Activity   Alcohol  use: Yes    Alcohol /week: 0.0 standard drinks of alcohol      Comment: occasionally   Drug use: Yes    Types: Marijuana   Sexual activity: Yes    Birth control/protection: None  Other Topics Concern   Not on file  Social History Narrative   Not on file   Social Drivers of Health   Tobacco Use: Medium Risk (08/12/2024)   Patient History    Smoking Tobacco Use: Former    Smokeless Tobacco Use: Never    Passive Exposure: Not on Actuary Strain: Low Risk (03/04/2024)   Overall Financial Resource Strain (CARDIA)    Difficulty of Paying Living Expenses: Not hard at all  Food Insecurity: No Food Insecurity (03/04/2024)   Epic    Worried About Programme Researcher, Broadcasting/film/video in the Last Year: Never true    Ran Out of Food in the Last Year: Never true  Transportation Needs: No Transportation Needs (03/04/2024)   Epic    Lack of Transportation (Medical): No    Lack of Transportation (Non-Medical): No  Physical Activity: Insufficiently Active (03/04/2024)   Exercise Vital Sign    Days of Exercise per Week: 4 days    Minutes of Exercise per Session: 30 min  Stress: Stress Concern Present (03/04/2024)   [employer] of Occupational Health - Occupational Stress Questionnaire    Feeling of Stress: Very much  Social Connections: Moderately Isolated (03/04/2024)   Social Connection and Isolation Panel    Frequency of Communication with Friends and Family: Three times a week    Frequency of Social Gatherings with Friends and Family: Once a week    Attends Religious Services: More than 4 times per year    Active Member of Clubs or Organizations: No    Attends Engineer, Structural: Not on file    Marital Status: Never married  Depression (PHQ2-9): High Risk (08/12/2024)   Depression (PHQ2-9)    PHQ-2 Score: 17  Alcohol  Screen: Low Risk (03/04/2024)   Alcohol  Screen    Last Alcohol  Screening Score (AUDIT): 1  Housing: Low Risk (03/04/2024)   Epic    Unable to Pay for Housing in the Last Year: No    Number of  Times Moved in the Last Year: 0    Homeless in the Last Year: No  Utilities: Not At Risk (08/12/2023)   AHC Utilities    Threatened with loss of utilities: No  Health Literacy: Adequate Health Literacy (08/12/2023)   B1300 Health Literacy    Frequency of need for help with medical instructions: Never    Allergies[1]  Outpatient Medications Prior to Visit  Medication Sig Dispense Refill   Accu-Chek Softclix Lancets  lancets Use to check blood sugar three times daily. E11.65 100 each 3   amLODipine  (NORVASC ) 5 MG tablet Take 1 tablet (5 mg total) by mouth daily. 30 tablet 0   atorvastatin  (LIPITOR) 20 MG tablet Take 1 tablet (20 mg total) by mouth daily. 90 tablet 0   busPIRone  (BUSPAR ) 5 MG tablet Take 1 tablet (5 mg total) by mouth 3 (three) times daily. 90 tablet 2   celecoxib  (CELEBREX ) 100 MG capsule Take 1 capsule (100 mg total) by mouth 2 (two) times daily. 60 capsule 0   DULoxetine  (CYMBALTA ) 60 MG capsule Take 1 capsule (60 mg total) by mouth daily. 90 capsule 0   EPINEPHRINE  0.3 mg/0.3 mL IJ SOAJ injection Inject 0.3 mg into the muscle as needed for anaphylaxis. 2 each 2   estradiol (ESTRACE) 0.1 MG/GM vaginal cream 1 gram Vaginal Once a day for 2 weeks then 2 times a week 90 days for 90     ibuprofen (ADVIL,MOTRIN) 200 MG tablet Take 200 mg by mouth every 6 (six) hours as needed for fever, headache, mild pain, moderate pain or cramping.     lamoTRIgine  (LAMICTAL ) 25 MG tablet Take one pill for one week and tan two pill a day 60 tablet 1   metFORMIN  (GLUCOPHAGE ) 500 MG tablet Take 1 tablet (500 mg total) by mouth daily with breakfast. 180 tablet 1   ondansetron  (ZOFRAN ) 4 MG tablet Take 1 tablet (4 mg total) by mouth every 6 (six) hours as needed for nausea or vomiting. 12 tablet 0   oxybutynin  (DITROPAN -XL) 5 MG 24 hr tablet Take 1 tablet (5 mg total) by mouth at bedtime. 90 tablet 0   OZEMPIC , 0.25 OR 0.5 MG/DOSE, 2 MG/3ML SOPN Inject 0.5 mg into the skin once a  week.     pantoprazole  (PROTONIX ) 40 MG tablet Take 1 tablet (40 mg total) by mouth daily. 90 tablet 1   predniSONE  (DELTASONE ) 20 MG tablet Take 2 tablets (40mg  total) x 3 days, followed by 1 tablet (20mg ) x 4 additional days until completion 10 tablet 0   pregabalin  (LYRICA ) 100 MG capsule Take 1 capsule (100 mg total) by mouth 2 (two) times daily. 60 capsule 3   QUEtiapine  Fumarate 150 MG TABS Take 150 mg by mouth at bedtime. 90 tablet 0   Semaglutide ,0.25 or 0.5MG /DOS, (OZEMPIC , 0.25 OR 0.5 MG/DOSE,) 2 MG/1.5ML SOPN Inject 0.25 mg into the skin once a week. For 4 weeks then increase to 0.5mg  2 mL 0   Semaglutide ,0.25 or 0.5MG /DOS, (OZEMPIC , 0.25 OR 0.5 MG/DOSE,) 2 MG/3ML SOPN Inject 0.5 mg into the skin once a week. 3 mL 6   spironolactone (ALDACTONE) 50 MG tablet Take 50 mg by mouth daily.     tiZANidine  (ZANAFLEX ) 4 MG tablet TAKE ONE TABLET BY MOUTH EVERY EIGHT HOURS AS NEEDED muscle SPASMS 270 tablet 0   valACYclovir (VALTREX) 1000 MG tablet Take 1,000 mg by mouth daily.     No facility-administered medications prior to visit.     ROS Review of Systems *** Objective:  BP 105/71   Pulse 89   Temp 98.1 F (36.7 C) (Oral)   Ht 5' 1.5 (1.562 m)   Wt 187 lb 6.4 oz (85 kg)   SpO2 100%   BMI 34.84 kg/m      08/12/2024    3:18 PM 08/04/2024   10:25 AM 05/08/2024   11:03 AM  BP/Weight  Systolic BP 105    Diastolic BP 71    Wt. (Lbs)  187.4    BMI 34.84 kg/m2       Information is confidential and restricted. Go to Review Flowsheets to unlock data.      Physical Exam ***    Latest Ref Rng & Units 02/04/2024   11:49 PM 10/09/2023   10:48 AM 04/15/2023    9:49 AM  CMP  Glucose 70 - 99 mg/dL 868  876  92   BUN 6 - 20 mg/dL 11  14  6    Creatinine 0.44 - 1.00 mg/dL 9.21  9.12  9.28   Sodium 135 - 145 mmol/L 143  141  142   Potassium 3.5 - 5.1 mmol/L 4.8  5.7  4.4   Chloride 98 - 111 mmol/L 107  102  102   CO2 22 - 32 mmol/L 26  24  25    Calcium  8.9 - 10.3 mg/dL  9.8  89.4  9.4   Total Protein 6.5 - 8.1 g/dL 8.4  7.9  7.3   Total Bilirubin 0.0 - 1.2 mg/dL 0.8  0.2  0.4   Alkaline Phos 38 - 126 U/L 77  106  99   AST 15 - 41 U/L 20  20  24    ALT 0 - 44 U/L 29  30  32     Lipid Panel     Component Value Date/Time   CHOL 140 10/09/2023 1048   TRIG 131 10/09/2023 1048   HDL 48 10/09/2023 1048   CHOLHDL 4.3 11/10/2020 0928   LDLCALC 69 10/09/2023 1048    CBC    Component Value Date/Time   WBC 8.4 02/04/2024 2349   RBC 4.84 02/04/2024 2349   HGB 12.8 02/04/2024 2349   HCT 41.0 02/04/2024 2349   PLT 289 02/04/2024 2349   MCV 84.7 02/04/2024 2349   MCH 26.4 02/04/2024 2349   MCHC 31.2 02/04/2024 2349   RDW 14.7 02/04/2024 2349   LYMPHSABS 1.9 04/19/2022 1159   MONOABS 0.4 04/19/2022 1159   EOSABS 0.1 04/19/2022 1159   BASOSABS 0.0 04/19/2022 1159    Lab Results  Component Value Date   HGBA1C 7.1 (A) 08/12/2024    Lab Results  Component Value Date   HGBA1C 7.1 (A) 08/12/2024   HGBA1C 6.7 10/09/2023   HGBA1C 6.3 04/10/2023      1. Type 2 diabetes mellitus with other specified complication, without long-term current use of insulin (HCC) (Primary) *** - POCT glycosylated hemoglobin (Hb A1C)  2. Screening for STD (sexually transmitted disease) *** - Cervicovaginal ancillary only    Healthcare maintenance ***  No orders of the defined types were placed in this encounter.   Follow-up: No follow-ups on file.       Corrina Sabin, MD, FAAFP. [facility] [place], [place] [phone]   08/12/2024, 3:43 PM     [1] Allergies Allergen Reactions   Amoxicillin  Hives and Rash    Has patient had a PCN reaction causing immediate rash, facial/tongue/throat swelling, SOB or lightheadedness with hypotension: Yes Has patient had a PCN reaction causing severe rash involving mucus membranes or skin necrosis: Yes Has patient had a PCN reaction that required hospitalization: No Has patient  had a PCN reaction occurring within the last 10 years: No If all of the above answers are NO, then may proceed with Cephalosporin use.    Azithromycin Shortness Of Breath and Rash   Darvocet [Propoxyphene N-Acetaminophen ] Shortness Of Breath and Rash   Grapeseed Extract [Nutritional Supplements] Shortness Of Breath, Swelling and Rash  GRAPES   Kiwi Extract Shortness Of Breath, Swelling and Rash   Latex Shortness Of Breath   Macrolides And Ketolides Shortness Of Breath and Rash   Morphine And Codeine Shortness Of Breath, Rash and Other (See Comments)    MUSCLE RIGIDITY   Nutritional Supplements Rash, Shortness Of Breath and Swelling    GRAPES   Sulfa Antibiotics Shortness Of Breath    MUSCLE RIGIDITY   Sulfamethoxazole-Trimethoprim Anaphylaxis   Klonopin [Clonazepam] Rash and Other (See Comments)    HOMICIDAL THOUGHTS   Penicillins Itching and Swelling    Swelling of mouth Has patient had a PCN reaction causing immediate rash, facial/tongue/throat swelling, SOB or lightheadedness with hypotension: Yes Has patient had a PCN reaction causing severe rash involving mucus membranes or skin necrosis: No Has patient had a PCN reaction that required hospitalization: No Has patient had a PCN reaction occurring within the last 10 years: No If all of the above answers are NO, then may proceed with Cephalosporin use.    Zoloft [Sertraline Hcl] Other (See Comments)    TARDIVE DYSKINESIA   Contrast Media [Iodinated Contrast Media] Swelling    Pt CAN NOT have drinking contrast; She CAN have IV contrast; when pt drinks water soluble contrast, she develops shallow breathing and hives along with Herpes Type 2 around her eyes   Flagyl  [Metronidazole ] Nausea And Vomiting   Other Other (See Comments)   Adhesive [Tape] Rash   Concerta [Methylphenidate] Nausea Only   Lexapro [Escitalopram Oxalate] Diarrhea and Rash   Lortab [Hydrocodone-Acetaminophen ] Rash  "

## 2024-08-12 NOTE — Patient Instructions (Signed)
 VISIT SUMMARY:  Today, we addressed your concerns about STD testing, diabetes management, hypertension, muscle spasms, and pain. We also discussed your role as a caregiver and how it impacts your health.  YOUR PLAN:  -SCREENING FOR SEXUALLY TRANSMITTED INFECTIONS: We have ordered blood tests for HIV and syphilis, and obtained urine and vaginal swabs for chlamydia, gonorrhea, yeast, and bacterial vaginosis. These tests will help us  determine if you have any sexually transmitted infections.  -URINARY TRACT INFECTION: Your urinalysis confirms that you have a urinary tract infection (UTI). A UTI is an infection in any part of your urinary system. We have prescribed Macrobid  to treat the infection and sent your urine for further testing to ensure the right treatment.  -TYPE 2 DIABETES MELLITUS: Your A1c level has increased, indicating that your blood sugar control has worsened. We have decided to discontinue metformin  due to hypoglycemia and continue with Ozempic  0.25 mg weekly. Please monitor your blood sugar levels regularly. We will reassess your A1c and adjust your medication in 3 months.  -ESSENTIAL HYPERTENSION: Your blood pressure is currently low. Hypertension is high blood pressure, but in your case, we need to adjust your medication to avoid it dropping too low. We have reduced your amlodipine  dose to 2.5 mg daily and you should continue taking spironolactone 50 mg daily.  -FIBROMYALGIA: Fibromyalgia is a condition characterized by widespread pain and muscle spasms. Since tizanidine  is not effective for you, we have prescribed Robaxin  as an alternative muscle relaxant. We may also consider increasing your duloxetine  dose, pending input from your psychiatrist.  -RHEUMATOID ARTHRITIS: Rheumatoid arthritis is an autoimmune condition that causes pain and weakness in your joints. Please continue with your physical therapy sessions to help manage your symptoms.  INSTRUCTIONS:  Please follow up in  3 months to reassess your A1c and adjust your Ozempic  dose if necessary. Continue monitoring your blood sugar levels regularly. We will also review the results of your STD tests and urine culture at your next visit.

## 2024-08-13 ENCOUNTER — Ambulatory Visit: Admitting: Physical Therapy

## 2024-08-13 ENCOUNTER — Ambulatory Visit: Attending: Family Medicine

## 2024-08-13 ENCOUNTER — Encounter: Payer: Self-pay | Admitting: Physical Therapy

## 2024-08-13 ENCOUNTER — Other Ambulatory Visit: Payer: Self-pay | Admitting: Family Medicine

## 2024-08-13 ENCOUNTER — Encounter: Payer: Self-pay | Admitting: Family Medicine

## 2024-08-13 DIAGNOSIS — M5459 Other low back pain: Secondary | ICD-10-CM | POA: Diagnosis not present

## 2024-08-13 DIAGNOSIS — E1169 Type 2 diabetes mellitus with other specified complication: Secondary | ICD-10-CM

## 2024-08-13 DIAGNOSIS — M6281 Muscle weakness (generalized): Secondary | ICD-10-CM

## 2024-08-13 DIAGNOSIS — Z7985 Long-term (current) use of injectable non-insulin antidiabetic drugs: Secondary | ICD-10-CM

## 2024-08-13 DIAGNOSIS — Z113 Encounter for screening for infections with a predominantly sexual mode of transmission: Secondary | ICD-10-CM

## 2024-08-13 LAB — CERVICOVAGINAL ANCILLARY ONLY
Bacterial Vaginitis (gardnerella): POSITIVE — AB
Candida Glabrata: NEGATIVE
Candida Vaginitis: NEGATIVE
Chlamydia: NEGATIVE
Comment: NEGATIVE
Comment: NEGATIVE
Comment: NEGATIVE
Comment: NEGATIVE
Comment: NEGATIVE
Comment: NORMAL
Neisseria Gonorrhea: NEGATIVE
Trichomonas: NEGATIVE

## 2024-08-13 NOTE — Telephone Encounter (Unsigned)
 Copied from CRM #[id number]. Topic: Clinical - Medication Refill >> Aug 13, 2024  2:17 PM Donna E wrote: Medication:  OZEMPIC , 0.25 OR 0.5 MG/DOSE, 2 MG/3ML SOPN  Has the patient contacted their pharmacy? Yes Pharmacy needs a prior auth and new prescription sent in  This is the patient's preferred pharmacy:   [facility] - [place], [place] - [address] [place] [address] Phone: [phone] Fax: [phone]   Is this the correct pharmacy for this prescription? Yes If no, delete pharmacy and type the correct one.   Has the prescription been filled recently? No  Is the patient out of the medication? No  Has the patient been seen for an appointment in the last year OR does the patient have an upcoming appointment? Yes  Can we respond through MyChart? Yes  Agent: Please be advised that Rx refills may take up to 3 business days. We ask that you follow-up with your pharmacy.

## 2024-08-13 NOTE — Therapy (Signed)
 " OUTPATIENT PHYSICAL THERAPY THORACOLUMBAR TREATMENT  Patient Name: Victoria Holmes MRN: [id number] DOB:[birth year], 44 y.o., female Today's Date: 08/13/2024   PT End of Session - 08/13/24 1321     Visit Number 4    Date for Recertification  09/22/24    Authorization Type [employer] MCR    Progress Note Due on Visit 10    PT Start Time 1321    PT Stop Time 1400    PT Time Calculation (min) 39 min          Past Medical History:  Diagnosis Date   Anemia    no current med.   Anxiety    Chronic tonsillitis 03/2012   Depression    Diabetes mellitus    diet-controlled   Fibrocystic breast disease    Headache(784.0)    migraines   History of endometriosis    Hyperlipidemia    Hypertension    under control, has been on med. x 1 yr.   Lupus    Lupus    Neuritis    Panic attacks    Raynauds disease    Rheumatoid arthritis(714.0)    Schizoaffective disorder (HCC)    Seizures (HCC)    last seizure > 1 yr. ago   Sinusitis    Sleep disturbance    Thyroid  nodule    Past Surgical History:  Procedure Laterality Date   ABDOMINAL HYSTERECTOMY  6 yrs. ago   complete   CHOLECYSTECTOMY  6-8 yrs. ago   FOOT SURGERY  05/2011   right great toe   TONSILLECTOMY  04/22/2012   Procedure: TONSILLECTOMY;  Surgeon: Lonni FORBES Angle, MD;  Location: [facility];  Service: ENT;  Laterality: N/A;   Patient Active Problem List   Diagnosis Date Noted   Schizoaffective disorder (HCC) 05/08/2022   Controlled diabetes mellitus type 2 with complications (HCC) 03/17/2021   Rheumatoid arthritis (HCC)    Raynauds disease    Precordial pain 05/18/2020   Essential hypertension 04/13/2020   Palpitations 04/13/2020   Carpopedal spasm 02/01/2015   Epilepsy (HCC) 11/24/2014   Drug allergy , multiple 07/17/2014    PCP: Delbert Clam, MD  REFERRING PROVIDER: Delbert Clam, MD  THERAPY DIAG:  Other low back pain  Muscle weakness  REFERRING DIAG: Low back pain  Rationale for  Evaluation and Treatment:  Rehabilitation  SUBJECTIVE:  PERTINENT PAST HISTORY:  Epilepsy, rheumatoid arthritis, DM TII, Schizoaffective disorder, lupus, fibromyalgia       PRECAUTIONS: None  WEIGHT BEARING RESTRICTIONS No  FALLS:  Has patient fallen in last 6 months? No, Number of falls: 1 fall - not sure what happened came to on my balcony  MOI/History of condition:  Onset date: > 5 years  SUBJECTIVE STATEMENT  No back spasms today. Feel sore in thighs from exercises. Back feels a little better.   Pt is a 44 y.o. female who presents to clinic with chief complaint of chronic low back pain.  She attributes this to her lupus or RA.  She has been on a muscle relaxer but continues to have spasms.  She feels like her hip and low back will lock.  She has gone to chiro but this was not too helpful.  She has infrequent n/t in legs but this may happen 3/14 days.  She feels this may be a stress reaction before her body locks up  Pain:  Are you having pain? Yes Pain location: low back NPRS scale:  Best: 0/10, Worst: 10/10 Aggravating factors: bending, lifting, standing, working  Relieving factors: rest Pain description: sharp, aching, and tight  Occupation: Materials Engineer: NA  Hand Dominance: NA  Patient Goals/Specific Activities: improve pain   OBJECTIVE:    LUMBAR AROM  AROM AROM  (Eval)  Flexion Fingertips to toes, w/ concordant pain  Extension limited by 50%, w/ concordant pain  Right lateral flexion limited by 25%, w/ concordant pain  Left lateral flexion limited by 25%, w/ concordant pain  Right rotation limited by 25%  Left rotation limited by 25%, w/ concordant pain    (Blank rows = not tested)   LE MMT:  MMT Right (Eval) Left (Eval)  Hip flexion (L2, L3) 5 5  Knee extension (L3) 4 4  Knee flexion 4 4  Hip abduction 3+ 3+  Hip extension    Hip external rotation    Hip internal rotation    Hip adduction    Ankle dorsiflexion (L4)    Ankle  plantarflexion (S1)    Ankle inversion    Ankle eversion    Great Toe ext (L5)    Grossly     (Blank rows = not tested, score listed is out of 5 possible points.  N = WNL, D = diminished, C = clear for gross weakness with myotome testing, * = concordant pain with testing)  SPECIAL TESTS:  Straight leg raise: L (-), R (-) Slump: L (-), R (-)  MUSCLE LENGTH: Hamstrings: Right no restriction; Left no restriction Hip flexors: Not tested  LE ROM:  ROM Right (Eval) Left (Eval)  Hip flexion    Hip extension    Hip abduction    Hip adduction    Hip internal rotation    Hip external rotation    Knee flexion    Knee extension    Ankle dorsiflexion    Ankle plantarflexion    Ankle inversion    Ankle eversion      (Blank rows = not tested, N = WNL, * = concordant pain with testing)  Functional Tests  Eval                                                               PATIENT SURVEYS:  ODI: 21/50  HOME EXERCISE PROGRAM: PPT 10x5s 2x/day  Access Code: [email or web address] URL: [email or web address] Date: 08/04/2024 Prepared by: Harlene Persons  Exercises - Pelvic tilt  - 1 x daily - 7 x weekly - 2 sets - 8 reps - 3 hold - Hooklying Clamshell with Resistance  - 2 x daily - 7 x weekly - 2 sets - 8 reps - 3 hold - Supine Bridge with Resistance Band  - 1 x daily - 7 x weekly - 2 sets - 8 reps Added - Seated Knee Extension with Resistance  - 1 x daily - 7 x weekly - 2 sets - 8 reps - Seated Hamstring Curls with Resistance  - 1 x daily - 7 x weekly - 2 sets - 8 reps  Treatment priorities   Eval        Check hip flexor tightness  TODAY'S TREATMENT: [facility] Adult PT Treatment:                                                DATE: 08/13/24 Therapeutic Exercise: Nustep L5 UE/LE x 5 minutes  5 x 2 STS mat table Seated lumbar roll with Pball x 10 Seated HS curl - 2x10 - RTB Seated knee ext 2 x 8 RTB  Supine clam GTB  1 x 8 LTR PPT 3 sec x 10    08/10/24 Therex:  nu-step L4 84m LE only d/t shoulder pain Seated lumbar roll out with Pball LAQ - 2# - 2x10 Seated HS curl - 2x8 - RTB STS - 5x - raised table Education regarding goals of therapy: function vs pain reduction and different ways to view progress   ASSESSMENT:  CLINICAL IMPRESSION: Pt continues with good tolerance d/t pain. Reports she did well after last session and is having less daily back spasms. Mild increases in joint pain (left knee, low back, hips) reported throughout which are transient with no overall increase in pain following session.     EVAL: Sekai is a 44 y.o. female who presents to clinic with signs and sxs consistent with chronic low back pain and spasms.  Limited depth of of exam d/t pt late arrival.   No clear sign of radiculopathy.   Margerite will benefit from skilled PT to address relevant deficits and improve comfort with daily tasks.   OBJECTIVE IMPAIRMENTS: Pain, lumbar ROM, LE/hip/core strength, gati  ACTIVITY LIMITATIONS: standing, walking, bending, lifting, recreation  PERSONAL FACTORS: See medical history and pertinent history   REHAB POTENTIAL: Good  CLINICAL DECISION MAKING: Evolving/moderate complexity  EVALUATION COMPLEXITY: Moderate   GOALS:   SHORT TERM GOALS: Target date: 08/25/2024   Odelia will be >75% HEP compliant to improve carryover between sessions and facilitate independent management of condition  Evaluation: ongoing Goal status: INITIAL   LONG TERM GOALS: Target date: 09/22/2024   Tekoa will self report >/= 50% decrease in pain from evaluation to improve function in daily tasks  Evaluation/Baseline: 10/10 max pain Goal status: INITIAL   2.  Shirlene will show a >/= 8 pt improvement in their ODI score (MCID is 12% or 6/50 pts) as a proxy for functional improvement   Evaluation/Baseline: 21 pts Goal status: INITIAL   3.  Madison will be able to complete workouts (possibly modified),  not limited by pain  Evaluation/Baseline: limited Goal status: INITIAL   4.  Reginald will improve the following MMTs to >/= 4/5 to show improvement in strength:  hip abd and ext   Evaluation/Baseline: see chart in note Goal status: INITIAL    PLAN: PT FREQUENCY: 1-2x/week  PT DURATION: 8 weeks  PLANNED INTERVENTIONS:  97164- PT Re-evaluation, 97110-Therapeutic exercises, 97530- Therapeutic activity, 97112- Neuromuscular re-education, 97535- Self Care, 02859- Manual therapy, [id number]- Gait training, [id number]- Aquatic Therapy, [phone]- Electrical stimulation (manual), [id number]- Vasopneumatic device, [id number]- Traction (mechanical), [id number]- Ionotophoresis 4mg /ml Dexamethasone , Taping, Dry Needling, Joint manipulation, and Spinal manipulation.   Harlene CHRISTELLA Persons PTA 08/13/24 1:25 PM Phone: [phone] Fax: [phone]  "

## 2024-08-14 ENCOUNTER — Ambulatory Visit: Payer: Self-pay | Admitting: Family Medicine

## 2024-08-14 LAB — CMP14+EGFR
ALT: 25 IU/L (ref 0–32)
AST: 20 IU/L (ref 0–40)
Albumin: 4.3 g/dL (ref 3.9–4.9)
Alkaline Phosphatase: 96 IU/L (ref 41–116)
BUN/Creatinine Ratio: 8 — ABNORMAL LOW (ref 9–23)
BUN: 7 mg/dL (ref 6–24)
Bilirubin Total: 0.5 mg/dL (ref 0.0–1.2)
CO2: 22 mmol/L (ref 20–29)
Calcium: 9.7 mg/dL (ref 8.7–10.2)
Chloride: 106 mmol/L (ref 96–106)
Creatinine, Ser: 0.87 mg/dL (ref 0.57–1.00)
Globulin, Total: 2.7 g/dL (ref 1.5–4.5)
Glucose: 108 mg/dL — ABNORMAL HIGH (ref 70–99)
Potassium: 4.6 mmol/L (ref 3.5–5.2)
Sodium: 143 mmol/L (ref 134–144)
Total Protein: 7 g/dL (ref 6.0–8.5)
eGFR: 85 mL/min/1.73

## 2024-08-14 LAB — MICROALBUMIN / CREATININE URINE RATIO
Creatinine, Urine: 186.9 mg/dL
Microalb/Creat Ratio: 14 mg/g{creat} (ref 0–29)
Microalbumin, Urine: 25.6 ug/mL

## 2024-08-14 LAB — TREPONEMAL ANTIBODIES, TPPA: Treponemal Antibodies, TPPA: NONREACTIVE

## 2024-08-14 LAB — LP+NON-HDL CHOLESTEROL
Cholesterol, Total: 133 mg/dL (ref 100–199)
HDL: 44 mg/dL
LDL Chol Calc (NIH): 68 mg/dL (ref 0–99)
Total Non-HDL-Chol (LDL+VLDL): 89 mg/dL (ref 0–129)
Triglycerides: 114 mg/dL (ref 0–149)
VLDL Cholesterol Cal: 21 mg/dL (ref 5–40)

## 2024-08-14 LAB — RPR W/REFLEX TO TREPSURE: RPR: NONREACTIVE

## 2024-08-14 LAB — HIV ANTIBODY (ROUTINE TESTING W REFLEX): HIV Screen 4th Generation wRfx: NONREACTIVE

## 2024-08-14 MED ORDER — OZEMPIC (0.25 OR 0.5 MG/DOSE) 2 MG/1.5ML ~~LOC~~ SOPN: 0.2500 mg | PEN_INJECTOR | SUBCUTANEOUS | 0 refills | Status: AC

## 2024-08-14 MED ORDER — METRONIDAZOLE 0.75 % VA GEL: 1.0000 | Freq: Every day | VAGINAL | 0 refills | Status: AC

## 2024-08-14 NOTE — Telephone Encounter (Signed)
 Requested medication (s) are due for refill today - unsure  Requested medication (s) are on the active medication list -yes  Future visit scheduled -no  Last refill: 10/09/23 2ml  Notes to clinic: medication listed twice- not sure this is current- sent for review   Requested Prescriptions  Pending Prescriptions Disp Refills   Semaglutide ,0.25 or 0.5MG /DOS, (OZEMPIC , 0.25 OR 0.5 MG/DOSE,) 2 MG/1.5ML SOPN 2 mL 0    Sig: Inject 0.25 mg into the skin once a week. For 4 weeks then increase to 0.5mg      Endocrinology:  Diabetes - GLP-1 Receptor Agonists - semaglutide  Failed - 08/14/2024 10:24 AM      Failed - HBA1C in normal range and within 180 days    HbA1c, POC (controlled diabetic range)  Date Value Ref Range Status  08/12/2024 7.1 (A) 0.0 - 7.0 % Final         Passed - Cr in normal range and within 360 days    Creatinine, Ser  Date Value Ref Range Status  08/13/2024 0.87 0.57 - 1.00 mg/dL Final         Passed - Valid encounter within last 6 months    Recent Outpatient Visits           2 days ago Type 2 diabetes mellitus with other specified complication, without long-term current use of insulin (HCC)   [facility] [place] Delbert Clam, MD   5 months ago Low back pain, unspecified back pain laterality, unspecified chronicity, unspecified whether sciatica present   [facility], Amy J, NP   6 months ago Low back pain, unspecified back pain laterality, unspecified chronicity, unspecified whether sciatica present   [facility], Amy J, NP   10 months ago Type 2 diabetes mellitus with other specified complication, without long-term current use of insulin (HCC)   [facility] [place] Delbert Clam, MD   1 year ago UTI symptoms   [facility] [place]. [facility] Theotis Haze ORN, NP                 Requested Prescriptions  Pending Prescriptions Disp Refills   Semaglutide ,0.25 or 0.5MG /DOS, (OZEMPIC , 0.25 OR 0.5 MG/DOSE,) 2 MG/1.5ML SOPN 2 mL 0    Sig: Inject 0.25 mg into the skin once a week. For 4 weeks then increase to 0.5mg      Endocrinology:  Diabetes - GLP-1 Receptor Agonists - semaglutide  Failed - 08/14/2024 10:24 AM      Failed - HBA1C in normal range and within 180 days    HbA1c, POC (controlled diabetic range)  Date Value Ref Range Status  08/12/2024 7.1 (A) 0.0 - 7.0 % Final         Passed - Cr in normal range and within 360 days    Creatinine, Ser  Date Value Ref Range Status  08/13/2024 0.87 0.57 - 1.00 mg/dL Final         Passed - Valid encounter within last 6 months    Recent Outpatient Visits           2 days ago Type 2 diabetes mellitus with other specified complication, without long-term current use of insulin (HCC)   [facility] [place]. [facility]  Delbert Clam, MD   5 months ago Low back pain, unspecified back pain laterality, unspecified chronicity, unspecified whether sciatica present   [facility], Amy J, NP   6 months ago Low back pain, unspecified back pain laterality, unspecified chronicity, unspecified whether sciatica present   [facility], Amy J, NP   10 months ago Type 2 diabetes mellitus with other specified complication, without long-term current use of insulin (HCC)   [facility] [place]. [facility] Delbert Clam, MD   1 year ago UTI symptoms   [facility] . [facility] Theotis Haze ORN, [place]

## 2024-08-17 ENCOUNTER — Ambulatory Visit: Admitting: Physical Therapy

## 2024-08-17 ENCOUNTER — Telehealth: Payer: Self-pay

## 2024-08-17 LAB — URINE CULTURE

## 2024-08-17 NOTE — Telephone Encounter (Signed)
 Pharmacy Patient Advocate Encounter  Received notification from [employer] that Prior Authorization for OZEMPIC  has been APPROVED from 07/23/2024 to 07/22/2025

## 2024-08-20 ENCOUNTER — Ambulatory Visit

## 2024-08-20 DIAGNOSIS — M5459 Other low back pain: Secondary | ICD-10-CM

## 2024-08-20 DIAGNOSIS — M6281 Muscle weakness (generalized): Secondary | ICD-10-CM

## 2024-08-20 NOTE — Therapy (Signed)
 " OUTPATIENT PHYSICAL THERAPY TREATMENT  Patient Name: Victoria Holmes MRN: [id number] DOB:1981-06-15, 44 y.o., female Today's Date: 08/20/2024     Past Medical History:  Diagnosis Date   Anemia    no current med.   Anxiety    Chronic tonsillitis 03/2012   Depression    Diabetes mellitus    diet-controlled   Fibrocystic breast disease    Headache(784.0)    migraines   History of endometriosis    Hyperlipidemia    Hypertension    under control, has been on med. x 1 yr.   Lupus    Lupus    Neuritis    Panic attacks    Raynauds disease    Rheumatoid arthritis(714.0)    Schizoaffective disorder (HCC)    Seizures (HCC)    last seizure > 1 yr. ago   Sinusitis    Sleep disturbance    Thyroid  nodule    Past Surgical History:  Procedure Laterality Date   ABDOMINAL HYSTERECTOMY  6 yrs. ago   complete   CHOLECYSTECTOMY  6-8 yrs. ago   FOOT SURGERY  05/2011   right great toe   TONSILLECTOMY  04/22/2012   Procedure: TONSILLECTOMY;  Surgeon: Lonni FORBES Angle, MD;  Location: [facility];  Service: ENT;  Laterality: N/A;   Patient Active Problem List   Diagnosis Date Noted   Schizoaffective disorder (HCC) 05/08/2022   Controlled diabetes mellitus type 2 with complications (HCC) 03/17/2021   Rheumatoid arthritis (HCC)    Raynauds disease    Precordial pain 05/18/2020   Essential hypertension 04/13/2020   Palpitations 04/13/2020   Carpopedal spasm 02/01/2015   Epilepsy (HCC) 11/24/2014   Drug allergy , multiple 07/17/2014    PCP: Delbert Clam, MD  REFERRING PROVIDER: Delbert Clam, MD  THERAPY DIAG:  Other low back pain  Muscle weakness  REFERRING DIAG: Low back pain  Rationale for Evaluation and Treatment:  Rehabilitation  SUBJECTIVE:  PERTINENT PAST HISTORY:  Epilepsy, rheumatoid arthritis, DM TII, Schizoaffective disorder, lupus,        PRECAUTIONS: None  WEIGHT BEARING RESTRICTIONS No  FALLS:  Has patient fallen in last 6 months?  No, Number of falls: 1 fall - not sure what happened came to on my balcony  MOI/History of condition:  Onset date: > 5 years  SUBJECTIVE STATEMENT  Patient reporting high levels of pain and fatigue today. She feels that she is experiencing a lupus flare up, but is told that she is in remission. Her Lupus is not currently being treated by medication. She reports extensive hx of her symptoms, PMHx and hx of previous medical trauma(s).   Pt is a 44 y.o. female who presents to clinic with chief complaint of chronic low back pain.  She attributes this to her lupus or RA.  She has been on a muscle relaxer but continues to have spasms.  She feels like her hip and low back will lock.  She has gone to chiro but this was not too helpful.  She has infrequent n/t in legs but this may happen 3/14 days.  She feels this may be a stress reaction before her body locks up  Pain:  Are you having pain? Yes Pain location: low back NPRS scale:  Best: 0/10, Worst: 10/10 Aggravating factors: bending, lifting, standing, working Relieving factors: rest Pain description: sharp, aching, and tight  Occupation: Materials Engineer: NA  Hand Dominance: NA  Patient Goals/Specific Activities: improve pain   OBJECTIVE:    LUMBAR AROM  AROM AROM  (Eval)  Flexion Fingertips to toes, w/ concordant pain  Extension limited by 50%, w/ concordant pain  Right lateral flexion limited by 25%, w/ concordant pain  Left lateral flexion limited by 25%, w/ concordant pain  Right rotation limited by 25%  Left rotation limited by 25%, w/ concordant pain    (Blank rows = not tested)   LE MMT:  MMT Right (Eval) Left (Eval)  Hip flexion (L2, L3) 5 5  Knee extension (L3) 4 4  Knee flexion 4 4  Hip abduction 3+ 3+  Hip extension    Hip external rotation    Hip internal rotation    Hip adduction    Ankle dorsiflexion (L4)    Ankle plantarflexion (S1)    Ankle inversion    Ankle eversion    Great Toe ext  (L5)    Grossly     (Blank rows = not tested, score listed is out of 5 possible points.  N = WNL, D = diminished, C = clear for gross weakness with myotome testing, * = concordant pain with testing)  SPECIAL TESTS:  Straight leg raise: L (-), R (-) Slump: L (-), R (-)  MUSCLE LENGTH: Hamstrings: Right no restriction; Left no restriction Hip flexors: Not tested  LE ROM:  ROM Right (Eval) Left (Eval)  Hip flexion    Hip extension    Hip abduction    Hip adduction    Hip internal rotation    Hip external rotation    Knee flexion    Knee extension    Ankle dorsiflexion    Ankle plantarflexion    Ankle inversion    Ankle eversion      (Blank rows = not tested, N = WNL, * = concordant pain with testing)  Functional Tests  Eval                                                               PATIENT SURVEYS:  ODI: 21/50  HOME EXERCISE PROGRAM: PPT 10x5s 2x/day  Access Code: [email or web address] URL: [email or web address] Date: 08/04/2024 Prepared by: Harlene Persons  Exercises - Pelvic tilt  - 1 x daily - 7 x weekly - 2 sets - 8 reps - 3 hold - Hooklying Clamshell with Resistance  - 2 x daily - 7 x weekly - 2 sets - 8 reps - 3 hold - Supine Bridge with Resistance Band  - 1 x daily - 7 x weekly - 2 sets - 8 reps Added - Seated Knee Extension with Resistance  - 1 x daily - 7 x weekly - 2 sets - 8 reps - Seated Hamstring Curls with Resistance  - 1 x daily - 7 x weekly - 2 sets - 8 reps  Treatment priorities   Eval        Check hip flexor tightness                                         TODAY'S TREATMENT:   [facility] Adult PT Treatment:  DATE: 08/20/2024  Therapeutic Exercise: Nustep L5 LE only x 10 minutes   Neuromuscular Reeducation:  Extensive discussion and patient education regarding long hx of pain, PMHx, medical trauma(s). Discussed options for treatment including ongoing therapeutic  exercise/activity, PNE/chronic pain management education. Mental Health and pain resilience approach. Disussed plan to focus on pain education and independent management strategies of pain, neuromuscular regulation, emotional regulation, with inclusion of exercises as indicated.   [facility] Adult PT Treatment:                                                DATE: 08/13/24 Therapeutic Exercise: Nustep L5 UE/LE x 5 minutes  5 x 2 STS mat table Seated lumbar roll with Pball x 10 Seated HS curl - 2x10 - RTB Seated knee ext 2 x 8 RTB  Supine clam GTB 1 x 8 LTR PPT 3 sec x 10    08/10/24 Therex:  nu-step L4 14m LE only d/t shoulder pain Seated lumbar roll out with Pball LAQ - 2# - 2x10 Seated HS curl - 2x8 - RTB STS - 5x - raised table Education regarding goals of therapy: function vs pain reduction and different ways to view progress   ASSESSMENT:  CLINICAL IMPRESSION: Today's session was limited d/t pain and overall fatigue levels. Patient also providing extensive history of her pain, pmhx and experiences of medical trauma. Patient education and discussion regarding PT treatment options and planning for future visits.    EVAL: Jisela is a 44 y.o. female who presents to clinic with signs and sxs consistent with chronic low back pain and spasms.  Limited depth of of exam d/t pt late arrival.   No clear sign of radiculopathy.   Jasreet will benefit from skilled PT to address relevant deficits and improve comfort with daily tasks.   OBJECTIVE IMPAIRMENTS: Pain, lumbar ROM, LE/hip/core strength, gati  ACTIVITY LIMITATIONS: standing, walking, bending, lifting, recreation  PERSONAL FACTORS: See medical history and pertinent history   REHAB POTENTIAL: Good  CLINICAL DECISION MAKING: Evolving/moderate complexity  EVALUATION COMPLEXITY: Moderate   GOALS:   SHORT TERM GOALS: Target date: 08/25/2024   Belle will be >75% HEP compliant to improve carryover between sessions and facilitate  independent management of condition  Evaluation: ongoing Goal status: INITIAL   LONG TERM GOALS: Target date: 09/22/2024   Karlen will self report >/= 50% decrease in pain from evaluation to improve function in daily tasks  Evaluation/Baseline: 10/10 max pain Goal status: INITIAL   2.  Raela will show a >/= 8 pt improvement in their ODI score (MCID is 12% or 6/50 pts) as a proxy for functional improvement   Evaluation/Baseline: 21 pts Goal status: INITIAL   3.  Tandi will be able to complete workouts (possibly modified), not limited by pain  Evaluation/Baseline: limited Goal status: INITIAL   4.  Mi will improve the following MMTs to >/= 4/5 to show improvement in strength:  hip abd and ext   Evaluation/Baseline: see chart in note Goal status: INITIAL    PLAN: PT FREQUENCY: 1-2x/week  PT DURATION: 8 weeks  PLANNED INTERVENTIONS:  97164- PT Re-evaluation, 97110-Therapeutic exercises, 97530- Therapeutic activity, [id number]- Neuromuscular re-education, 97535- Self Care, 02859- Manual therapy, [id number]- Gait training, [id number]- Aquatic Therapy, [phone]- Electrical stimulation (manual), [id number]- Vasopneumatic device, [id number]- Traction (mechanical), [id number]- Ionotophoresis 4mg /ml Dexamethasone , Taping, Dry Needling, Joint manipulation, and Spinal manipulation.  Marko Molt, PT, DPT  08/22/2024 12:09 PM   "

## 2024-08-24 ENCOUNTER — Ambulatory Visit: Attending: Sports Medicine | Admitting: Physical Therapy

## 2024-08-25 ENCOUNTER — Ambulatory Visit: Payer: Self-pay

## 2024-08-27 ENCOUNTER — Ambulatory Visit: Attending: Sports Medicine

## 2024-08-27 ENCOUNTER — Other Ambulatory Visit: Payer: Self-pay | Admitting: Family Medicine

## 2024-08-27 DIAGNOSIS — E1169 Type 2 diabetes mellitus with other specified complication: Secondary | ICD-10-CM

## 2024-08-31 ENCOUNTER — Ambulatory Visit (HOSPITAL_COMMUNITY)

## 2024-09-01 ENCOUNTER — Ambulatory Visit: Payer: Self-pay | Attending: Sports Medicine

## 2024-09-03 ENCOUNTER — Ambulatory Visit: Payer: Self-pay

## 2024-09-08 ENCOUNTER — Ambulatory Visit

## 2024-09-10 ENCOUNTER — Ambulatory Visit

## 2024-09-15 ENCOUNTER — Telehealth (HOSPITAL_COMMUNITY): Admitting: Psychiatry

## 2024-10-07 ENCOUNTER — Ambulatory Visit (HOSPITAL_COMMUNITY)
